# Patient Record
Sex: Female | Born: 1995 | Race: White | Hispanic: No | Marital: Married | Smoking: Former smoker
Health system: Southern US, Community
[De-identification: ages and names within clinical notes are randomized; demographics above are authoritative.]

## PROBLEM LIST (undated history)

## (undated) ENCOUNTER — Inpatient Hospital Stay (HOSPITAL_COMMUNITY): Payer: Self-pay

## (undated) DIAGNOSIS — J45909 Unspecified asthma, uncomplicated: Secondary | ICD-10-CM

## (undated) DIAGNOSIS — N83209 Unspecified ovarian cyst, unspecified side: Secondary | ICD-10-CM

## (undated) DIAGNOSIS — F419 Anxiety disorder, unspecified: Secondary | ICD-10-CM

## (undated) DIAGNOSIS — N390 Urinary tract infection, site not specified: Secondary | ICD-10-CM

## (undated) DIAGNOSIS — I10 Essential (primary) hypertension: Secondary | ICD-10-CM

## (undated) DIAGNOSIS — E119 Type 2 diabetes mellitus without complications: Secondary | ICD-10-CM

## (undated) DIAGNOSIS — E0590 Thyrotoxicosis, unspecified without thyrotoxic crisis or storm: Secondary | ICD-10-CM

## (undated) HISTORY — PX: ANKLE SURGERY: SHX546

## (undated) HISTORY — PX: TONSILLECTOMY: SUR1361

---
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
## 2017-03-28 DIAGNOSIS — F411 Generalized anxiety disorder: Secondary | ICD-10-CM | POA: Insufficient documentation

## 2017-03-28 DIAGNOSIS — F319 Bipolar disorder, unspecified: Secondary | ICD-10-CM | POA: Insufficient documentation

## 2017-05-11 ENCOUNTER — Encounter (HOSPITAL_COMMUNITY): Payer: Self-pay | Admitting: *Deleted

## 2017-05-11 ENCOUNTER — Other Ambulatory Visit: Payer: Self-pay

## 2017-05-11 ENCOUNTER — Emergency Department (HOSPITAL_COMMUNITY)
Admission: EM | Admit: 2017-05-11 | Discharge: 2017-05-12 | Disposition: A | Discharge status: Home / Self Care | Payer: BLUE CROSS/BLUE SHIELD | Attending: Emergency Medicine | Admitting: Emergency Medicine

## 2017-05-11 DIAGNOSIS — Z5321 Procedure and treatment not carried out due to patient leaving prior to being seen by health care provider: Secondary | ICD-10-CM | POA: Diagnosis not present

## 2017-05-11 DIAGNOSIS — M545 Low back pain: Secondary | ICD-10-CM | POA: Diagnosis present

## 2017-05-11 LAB — URINALYSIS, ROUTINE W REFLEX MICROSCOPIC
BACTERIA UA: NONE SEEN
BILIRUBIN URINE: NEGATIVE
Glucose, UA: NEGATIVE mg/dL
Ketones, ur: NEGATIVE mg/dL
Leukocytes, UA: NEGATIVE
NITRITE: NEGATIVE
PH: 7 (ref 5.0–8.0)
Protein, ur: NEGATIVE mg/dL
SPECIFIC GRAVITY, URINE: 1.003 — AB (ref 1.005–1.030)

## 2017-05-11 LAB — POC URINE PREG, ED: Preg Test, Ur: NEGATIVE

## 2017-05-11 NOTE — ED Triage Notes (Signed)
Pt with low back pain, increased urination and pain with urination, some nausea and vomiting

## 2017-05-11 NOTE — ED Notes (Signed)
Pt called for 

## 2017-05-12 NOTE — ED Notes (Signed)
Patient called x 3 and no answer. 

## 2017-06-17 IMAGING — CT CT ABD-PELV W/ CM
2 of 3 series · 17 of 46 positions shown, 19 images · IV contrast (Omnipaque 300)
Comparison: Ultrasound abdomen 10/03/2014.

CLINICAL DATA: RIGHT lower quadrant pain for 2 days. Nausea and
vomiting. Decreased appetite.

EXAM:
CT ABDOMEN AND PELVIS WITH CONTRAST
TECHNIQUE: Multidetector CT imaging of the abdomen and pelvis was performed
using the standard protocol following bolus administration of
intravenous contrast.
CONTRAST:  100mL OMNIPAQUE IOHEXOL 300 MG/ML  SOLN

[Series 2: abd_pel_with 5.0 b40f · axial · 0.74mm/px · z∈[-460,-70]mm · 14 of 90 slices shown, 16 images]
[im 6/90  soft-tissue]
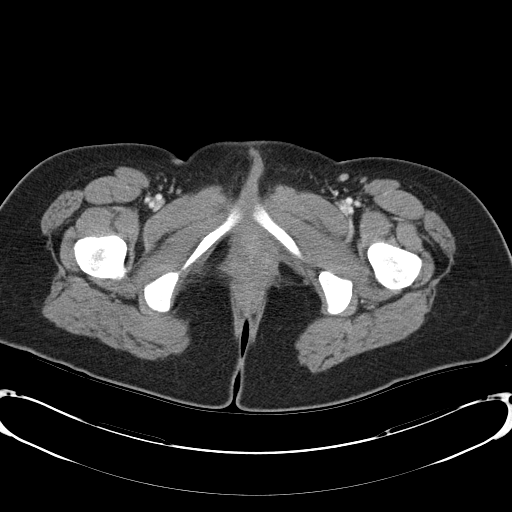
[im 6/90  bone]
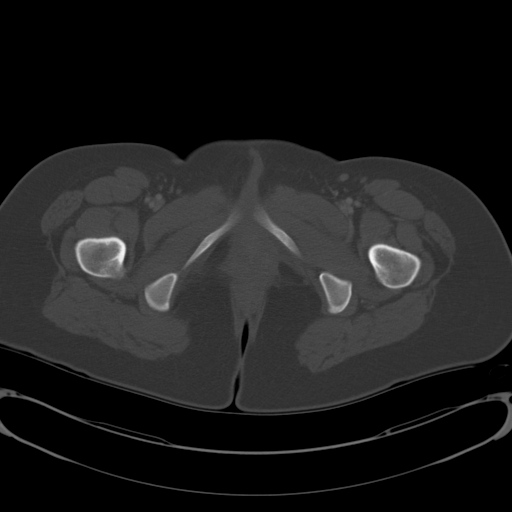
[im 12/90  soft-tissue]
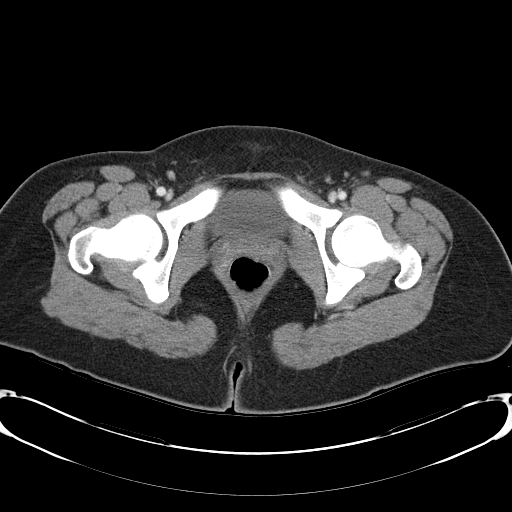
[im 18/90  soft-tissue]
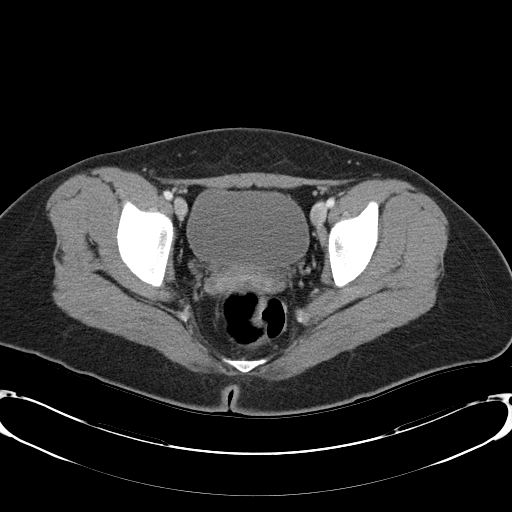
[im 23/90  soft-tissue]
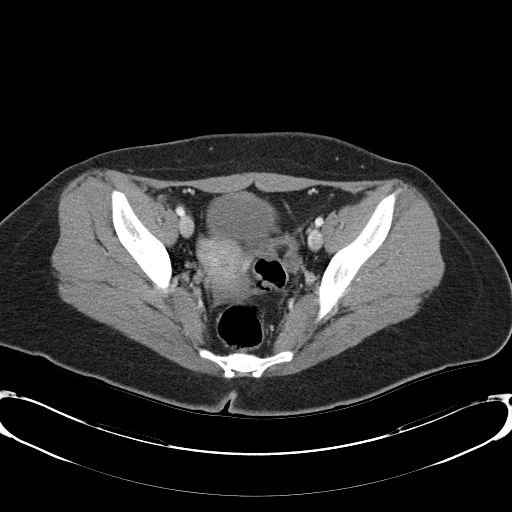
[im 29/90  soft-tissue]
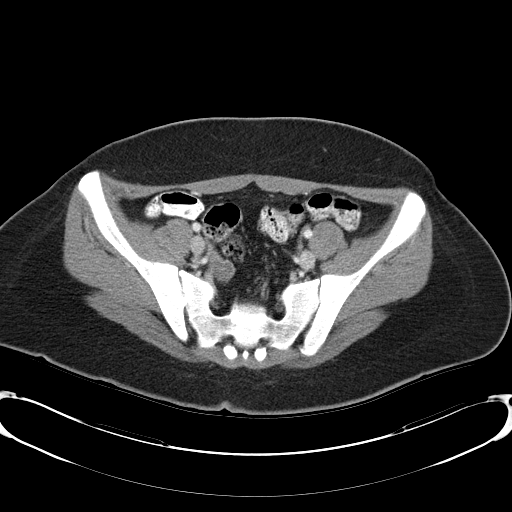
[im 35/90  soft-tissue]
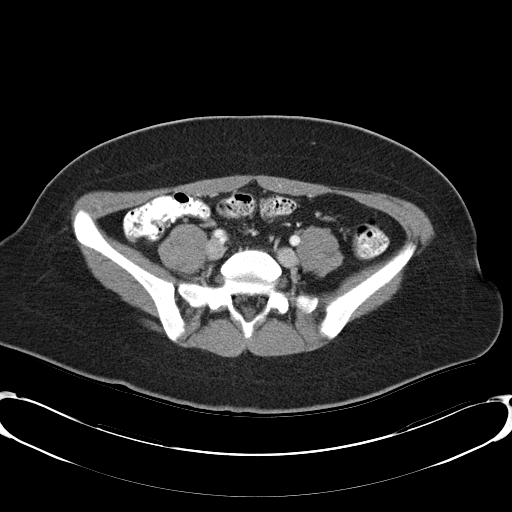
[im 41/90  soft-tissue]
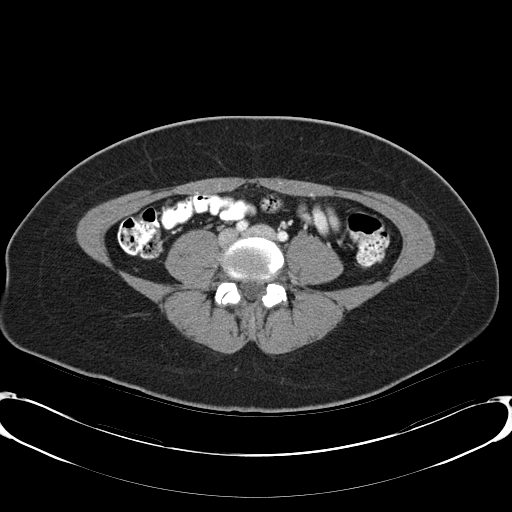
[im 49/90  soft-tissue]
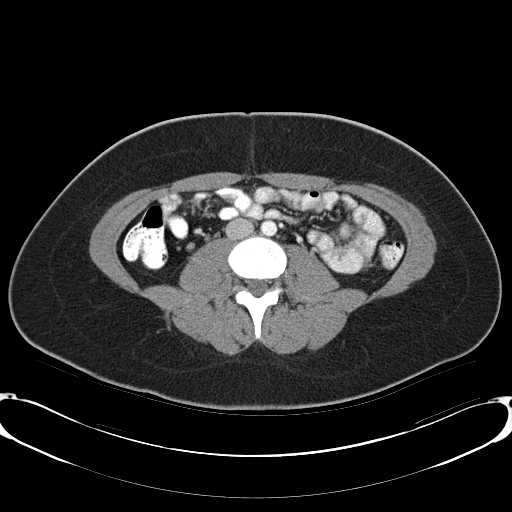
[im 55/90  soft-tissue]
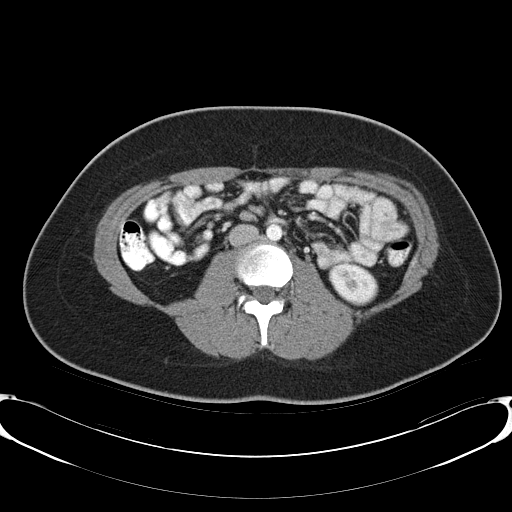
[im 55/90  bone]
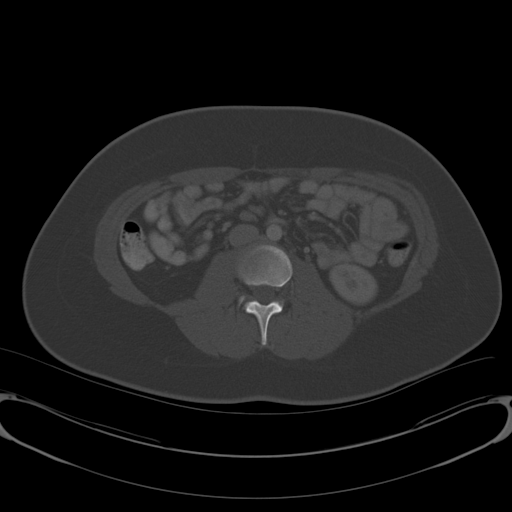
[im 61/90  soft-tissue]
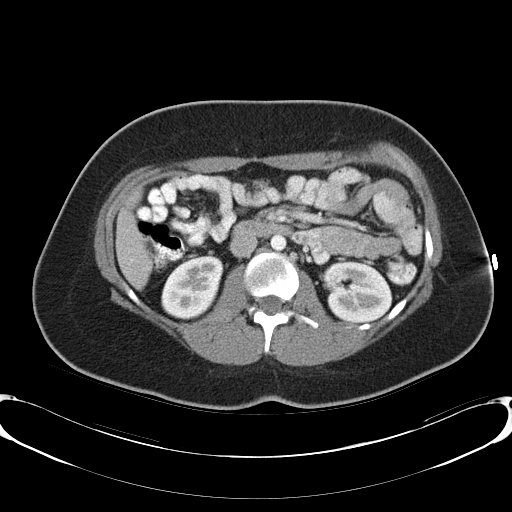
[im 67/90  soft-tissue]
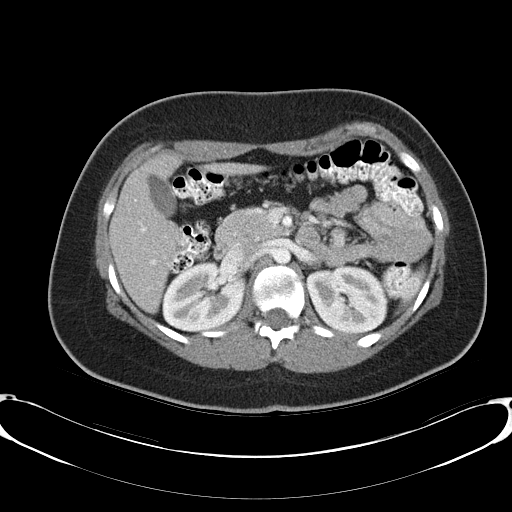
[im 72/90  soft-tissue]
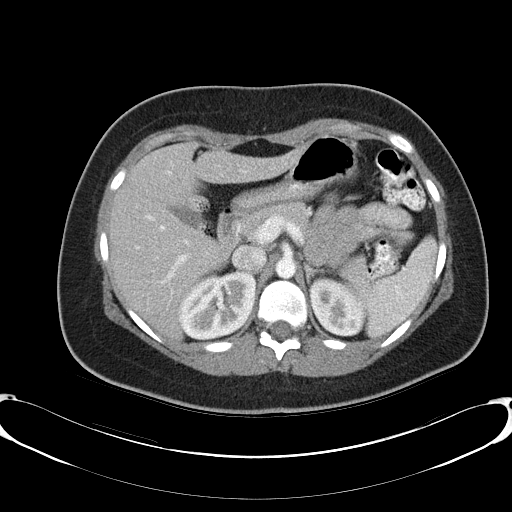
[im 78/90  soft-tissue]
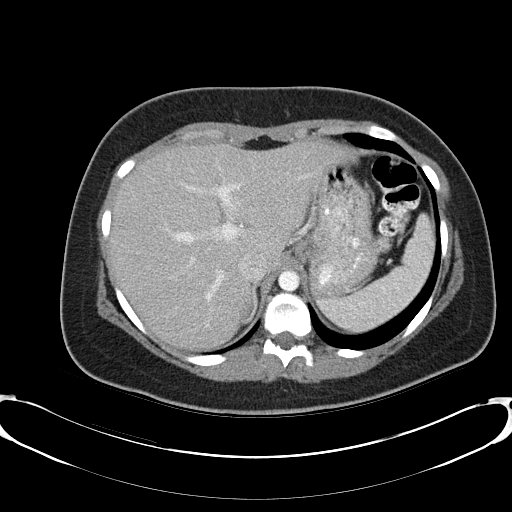
[im 84/90  soft-tissue]
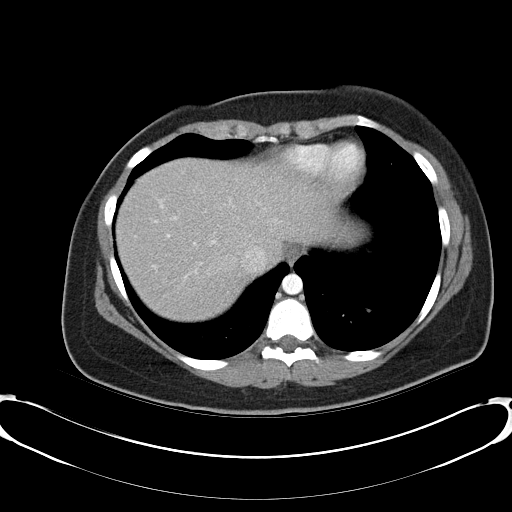

[Series 3: abd_pel_with 3.0 spo cor · coronal · 0.82mm/px · 3 of 77 slices shown]
[im 26/77  soft-tissue]
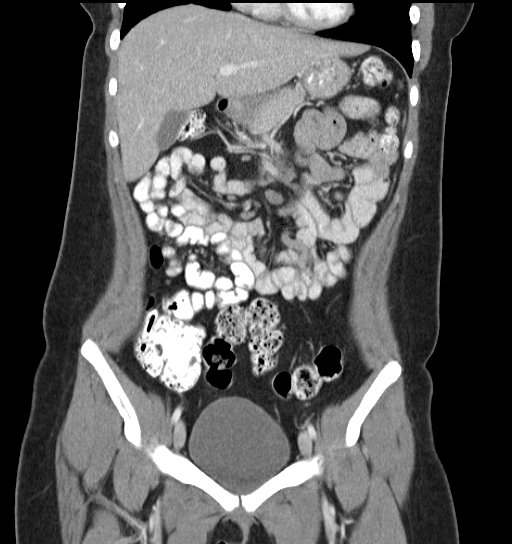
[im 34/77  soft-tissue]
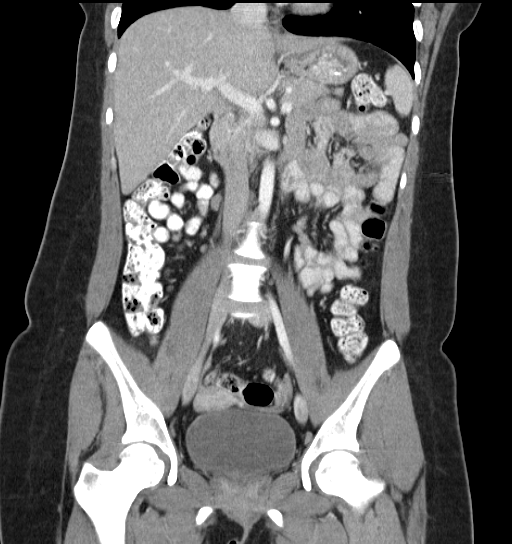
[im 43/77  soft-tissue]
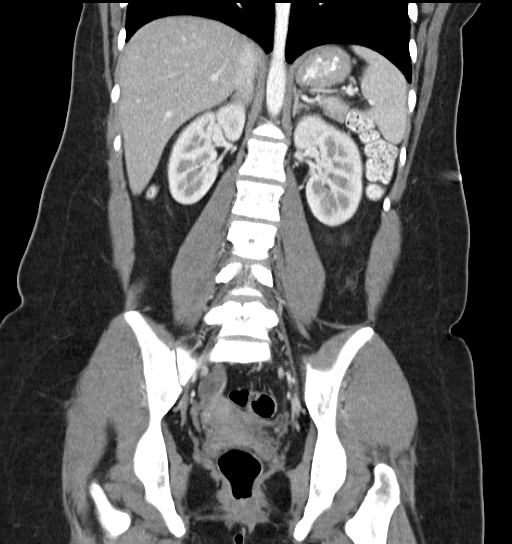

[17 of 46 positions shown; findings below may reference images not displayed]

FINDINGS: BODY WALL: Unremarkable.

LOWER CHEST: Unremarkable.

ABDOMEN/PELVIS:

Liver: No focal abnormality.

Biliary: No evidence of biliary obstruction or stone.

Pancreas: Unremarkable.

Spleen: Unremarkable.

Adrenals: Unremarkable.

Kidneys and ureters: No hydronephrosis or stone.

Bladder: Unremarkable.

Reproductive: Normal uterus. No adnexal mass. Trace fluid around the
RIGHT ovary of uncertain significance.

Bowel: No obstruction. Normal appendix.  Moderate stool burden.

Retroperitoneum: No mass or adenopathy.

Peritoneum: No free fluid or gas. Slightly prominent mesenteric
nodes extending toward the RIGHT lower quadrant suggesting
mesenteric adenitis.

Vascular: No acute abnormality.

OSSEOUS: No acute abnormalities.  Mild disc space narrowing L5-S1.
IMPRESSION: No evidence for appendicitis.

Slightly prominent mesenteric nodes extending toward the RIGHT lower
quadrant suggesting mesenteric adenitis.

Trace fluid around the RIGHT ovary of uncertain significance.

## 2019-07-26 ENCOUNTER — Other Ambulatory Visit: Payer: Self-pay

## 2019-09-12 ENCOUNTER — Emergency Department (HOSPITAL_COMMUNITY)
Admission: EM | Admit: 2019-09-12 | Discharge: 2019-09-12 | Disposition: A | Discharge status: Home / Self Care | Payer: 59 | Attending: Emergency Medicine | Admitting: Emergency Medicine

## 2019-09-12 ENCOUNTER — Emergency Department (HOSPITAL_COMMUNITY): Payer: 59

## 2019-09-12 ENCOUNTER — Other Ambulatory Visit: Payer: Self-pay

## 2019-09-12 ENCOUNTER — Ambulatory Visit: Payer: BLUE CROSS/BLUE SHIELD

## 2019-09-12 ENCOUNTER — Encounter (HOSPITAL_COMMUNITY): Payer: Self-pay | Admitting: Emergency Medicine

## 2019-09-12 DIAGNOSIS — F17210 Nicotine dependence, cigarettes, uncomplicated: Secondary | ICD-10-CM | POA: Diagnosis not present

## 2019-09-12 DIAGNOSIS — K625 Hemorrhage of anus and rectum: Secondary | ICD-10-CM | POA: Insufficient documentation

## 2019-09-12 DIAGNOSIS — R1031 Right lower quadrant pain: Secondary | ICD-10-CM | POA: Diagnosis not present

## 2019-09-12 DIAGNOSIS — J45909 Unspecified asthma, uncomplicated: Secondary | ICD-10-CM | POA: Insufficient documentation

## 2019-09-12 DIAGNOSIS — Z79899 Other long term (current) drug therapy: Secondary | ICD-10-CM | POA: Insufficient documentation

## 2019-09-12 LAB — COMPREHENSIVE METABOLIC PANEL
ALT: 41 U/L (ref 0–44)
AST: 27 U/L (ref 15–41)
Albumin: 4.4 g/dL (ref 3.5–5.0)
Alkaline Phosphatase: 51 U/L (ref 38–126)
Anion gap: 10 (ref 5–15)
BUN: 9 mg/dL (ref 6–20)
CO2: 22 mmol/L (ref 22–32)
Calcium: 9.4 mg/dL (ref 8.9–10.3)
Chloride: 107 mmol/L (ref 98–111)
Creatinine, Ser: 0.77 mg/dL (ref 0.44–1.00)
GFR calc Af Amer: >60 mL/min (ref >60)
GFR calc non Af Amer: >60 mL/min (ref >60)
Glucose, Bld: 93 mg/dL (ref 70–99)
Potassium: 4.1 mmol/L (ref 3.5–5.1)
Sodium: 139 mmol/L (ref 135–145)
Total Bilirubin: 0.8 mg/dL (ref 0.3–1.2)
Total Protein: 7.5 g/dL (ref 6.5–8.1)

## 2019-09-12 LAB — CBC
HCT: 42 % (ref 36.0–46.0)
Hemoglobin: 14.4 g/dL (ref 12.0–15.0)
MCH: 31.7 pg (ref 26.0–34.0)
MCHC: 34.3 g/dL (ref 30.0–36.0)
MCV: 92.5 fL (ref 80.0–100.0)
Platelets: 327 10*3/uL (ref 150–400)
RBC: 4.54 MIL/uL (ref 3.87–5.11)
RDW: 11.5 % (ref 11.5–15.5)
WBC: 7.5 10*3/uL (ref 4.0–10.5)
nRBC: 0 % (ref 0.0–0.2)

## 2019-09-12 LAB — POC OCCULT BLOOD, ED: Fecal Occult Bld: POSITIVE — AB

## 2019-09-12 LAB — ABO/RH: ABO/RH(D): A POS

## 2019-09-12 LAB — TYPE AND SCREEN
ABO/RH(D): A POS
Antibody Screen: NEGATIVE

## 2019-09-12 LAB — I-STAT BETA HCG BLOOD, ED (MC, WL, AP ONLY): I-stat hCG, quantitative: <5 m[IU]/mL (ref <5)

## 2019-09-12 MED ORDER — IOHEXOL 300 MG/ML  SOLN
100.0000 mL | Freq: Once | INTRAMUSCULAR | Status: AC | PRN
  Administered 2019-09-12: 100 mL via INTRAVENOUS

## 2019-09-12 NOTE — ED Provider Notes (Signed)
[facility] EMERGENCY DEPARTMENT Provider Note   CSN: [id number] Arrival date & time: 09/12/19  1258     History Chief Complaint  Patient presents with   Rectal Bleeding   Abdominal Pain    Carol Paul is a 24 y.o. female.  HPI She presents for evaluation of ongoing rectal bleeding, worse today with a single episode of a large amount of bleeding, both dark and red color.  He has intermittent every 3 to 4-day bleeding from the rectum over the last 2 months.  She has a remote history of "intestinal trouble."  She had an endoscopy at age 88, saw GI doctor then, without clear diagnosis.  She has had periods of vomiting, anorexia, and intermittent pain.  She denies diarrhea.  She does not currently see GI, and does not take any medications for her intestinal disorders.  She denies fever, chills, cough, shortness of breath or chest pain.  She thinks she has lost 15 pounds in the last month.  She noticed some right lower back and right lower abdomen pain, since last night.  The pain is described as sharp.  There are no other known modifying factors.    Past Medical History:  Diagnosis Date   Asthma     Patient Active Problem List   Diagnosis Date Noted   MDD (major depressive disorder) 11/26/2016   Nausea vomiting and diarrhea 11/07/2016   Abdominal pain 11/07/2016   Lactic acid increased 11/07/2016   Leukocytosis 11/07/2016   SIRS (systemic inflammatory response syndrome) (HCC) 11/07/2016    Past Surgical History:  Procedure Laterality Date   TONSILLECTOMY       OB History   No obstetric history on file.     Family History  Problem Relation Age of Onset   CAD Other     Social History   Tobacco Use   Smoking status: Current Some Day Smoker   Smokeless tobacco: Never Used  Substance Use Topics   Alcohol use: No    Comment: denies   Drug use: Yes    Types: Marijuana    Home Medications Prior to Admission medications     Medication Sig Start Date End Date Taking? Authorizing Provider  albuterol (PROVENTIL HFA;VENTOLIN HFA) 108 (90 BASE) MCG/ACT inhaler Inhale 1 puff into the lungs every 6 (six) hours as needed for wheezing or shortness of breath.   Yes [provider]  ALPRAZolam Prudy Feeler) 1 MG tablet Take 1 mg by mouth 2 (two) times daily as needed for anxiety.   Yes [provider]  amphetamine-dextroamphetamine (ADDERALL) 10 MG tablet Take 10 mg by mouth 3 (three) times daily.   Yes [provider]  fluticasone furoate-vilanterol (BREO ELLIPTA) 200-25 MCG/INH AEPB Inhale 1 puff into the lungs daily. 04/21/19  Yes [provider]  traZODone (DESYREL) 50 MG tablet Take 1 tablet (50 mg total) by mouth at bedtime as needed for sleep. 11/27/16  Yes Laveda Abbe, NP  famotidine (PEPCID) 20 MG tablet Take 1 tablet (20 mg total) by mouth 2 (two) times daily. Patient not taking: Reported on 09/12/2019 11/08/16   Randel Pigg, Dorma Russell, MD  gabapentin (NEURONTIN) 100 MG capsule Take 1 capsule (100 mg total) by mouth 3 (three) times daily. Patient not taking: Reported on 09/12/2019 11/27/16   Laveda Abbe, NP  hydrOXYzine (ATARAX/VISTARIL) 25 MG tablet Take 1 tablet (25 mg total) by mouth every 6 (six) hours as needed for anxiety. Patient not taking: Reported on 09/12/2019 11/27/16  Laveda Abbe, NP  sertraline (ZOLOFT) 25 MG tablet Take 1 tablet (25 mg total) by mouth daily. Patient not taking: Reported on 09/12/2019 11/27/16   Laveda Abbe, NP    Allergies    Patient has no known allergies.  Review of Systems   Review of Systems  All other systems reviewed and are negative.   Physical Exam Updated Vital Signs BP 99/71    Pulse (!) 108    Temp 97.9 F (36.6 C) (Oral)    Resp (!) 21    LMP 08/27/2019 (Approximate)    SpO2 100%   Physical Exam Vitals and nursing note reviewed.  Constitutional:      General: She is not in acute distress.    Appearance:  She is well-developed. She is not ill-appearing, toxic-appearing or diaphoretic.  HENT:     Head: Normocephalic and atraumatic.     Right Ear: External ear normal.     Left Ear: External ear normal.  Eyes:     Conjunctiva/sclera: Conjunctivae normal.     Pupils: Pupils are equal, round, and reactive to light.  Neck:     Trachea: Phonation normal.  Cardiovascular:     Rate and Rhythm: Normal rate and regular rhythm.     Heart sounds: Normal heart sounds.  Pulmonary:     Effort: Pulmonary effort is normal.     Breath sounds: Normal breath sounds.  Abdominal:     General: There is no distension.     Palpations: Abdomen is soft. There is no mass.     Tenderness: There is abdominal tenderness (Right lower quadrant, mild). There is no guarding.     Hernia: No hernia is present.  Genitourinary:    Comments: Normal anus.  Small amount of brown stool in rectal vault.  No visualized anal fissure, external hemorrhoid; or palpable internal hemorrhoid.  No rectal mass.  No fecal impaction. Musculoskeletal:        General: Normal range of motion.     Cervical back: Normal range of motion and neck supple.  Skin:    General: Skin is warm and dry.  Neurological:     Mental Status: She is alert and oriented to person, place, and time.     Cranial Nerves: No cranial nerve deficit.     Sensory: No sensory deficit.     Motor: No abnormal muscle tone.     Coordination: Coordination normal.  Psychiatric:        Behavior: Behavior normal.        Thought Content: Thought content normal.        Judgment: Judgment normal.     ED Results / Procedures / Treatments   Labs (all labs ordered are listed, but only abnormal results are displayed) Labs Reviewed  POC OCCULT BLOOD, ED - Abnormal; Notable for the following components:      Result Value   Fecal Occult Bld POSITIVE (*)    All other components within normal limits  COMPREHENSIVE METABOLIC PANEL  CBC  I-STAT BETA HCG BLOOD, ED ([facility], [facility], [facility]  ONLY)  TYPE AND SCREEN  ABO/RH    EKG None  Radiology CT Abdomen Pelvis W Contrast  Result Date: 09/12/2019 CLINICAL DATA:  24 year old presenting with a two-month history of bloody stools, within increase in the amount of blood in the stool today. EXAM: CT ABDOMEN AND PELVIS WITH CONTRAST TECHNIQUE: Multidetector CT imaging of the abdomen and pelvis was performed using the standard protocol following bolus administration of intravenous  contrast. CONTRAST:  123mL OMNIPAQUE IOHEXOL 300 MG/ML IV. COMPARISON:  11/07/2016 and earlier. FINDINGS: Lower chest: Scattered areas of hyperlucency involving the visualized lung bases. No confluent airspace consolidation. No pleural effusions. Normal heart size. Hepatobiliary: Liver normal in size and appearance. Gallbladder normal in appearance without calcified gallstones. No biliary ductal dilation. Pancreas: Normal in appearance without evidence of mass, ductal dilation, or inflammation. Spleen: Normal in size and appearance. Adrenals/Urinary Tract: Normal appearing adrenal glands. Kidneys normal in size and appearance without focal parenchymal abnormality. No hydronephrosis. No evidence of urinary tract calculi. Normal appearing urinary bladder. Stomach/Bowel: Stomach normal in appearance for the degree of distention. Normal-appearing small bowel. Moderate stool burden in the ascending and transverse colon. Descending colon, sigmoid colon and rectum decompressed. No colonic wall thickening to suggest colitis. No focal abnormality involving the colon. Normal appendix in the RIGHT upper pelvis extending to near the midline. Vascular/Lymphatic: No visible aortoiliofemoral atherosclerosis. Widely patent visceral arteries. Normal-appearing portal venous and systemic venous systems. Scattered normal sized lymph nodes throughout the mesentery. No pathologic lymphadenopathy. Reproductive: Normal-appearing uterus and ovaries without evidence of adnexal mass. Other: None.  Musculoskeletal: Regional skeleton unremarkable without acute or significant osseous abnormality. IMPRESSION: 1. No acute abnormalities involving the abdomen or pelvis. Specifically, no findings to explain the patient's bloody stools. 2. Scattered areas of hyperlucency involving the visualized lung bases which can be seen in patients with asthma and/or bronchitis. Electronically Signed   By: Evangeline Dakin M.D.   On: 09/12/2019 16:50    Procedures Procedures (including critical care time)  Medications Ordered in ED Medications  iohexol (OMNIPAQUE) 300 MG/ML solution 100 mL (100 mLs Intravenous Contrast Given 09/12/19 1632)    ED Course  I have reviewed the triage vital signs and the nursing notes.  Pertinent labs & imaging results that were available during my care of the patient were reviewed by me and considered in my medical decision making (see chart for details).  Clinical Course as of Sep 12 1802  Sun Sep 12, 2019  1726 Normal  I-Stat beta hCG blood, ED [EW]  1726 Normal  Comprehensive metabolic panel [EW]  4098 Normal  CBC [EW]  1745 Positive, normal  POC occult blood, ED(!) [EW]    Clinical Course User Index [EW] Daleen Bo, MD   MDM Rules/Calculators/A&P                       Patient Vitals for the past 24 hrs:  BP Temp Temp src Pulse Resp SpO2  09/12/19 1730 99/71 -- -- -- (!) 21 --  09/12/19 1711 110/65 -- -- -- 17 --  09/12/19 1644 -- -- -- (!) 108 17 100 %  09/12/19 1600 108/75 -- -- (!) 102 13 100 %  09/12/19 1530 99/82 -- -- (!) 108 16 --  09/12/19 1515 -- -- -- (!) 108 18 100 %  09/12/19 1500 105/65 -- -- (!) 111 (!) 23 96 %  09/12/19 1430 95/73 -- -- (!) 102 (!) 21 97 %  09/12/19 1345 112/83 -- -- (!) 116 -- 100 %  09/12/19 1342 126/89 -- -- (!) 120 -- 99 %  09/12/19 1307 125/89 97.9 F (36.6 C) Oral 100 16 100 %    5:45 PM Reevaluation with update and discussion. After initial assessment and treatment, an updated evaluation reveals she is  comfortable has no further complaints.  Findings discussed with the patient and all questions were answered. Daleen Bo   Medical Decision Making: Abdominal  pain, with rectal bleeding, by history.  Screening evaluation is reassuring.  No evidence for colitis, bowel obstruction, intra-abdominal mass, serious bacterial infection, anemia, metabolic instability or impending vascular collapse.  Stool guaiac testing, positive, stool is brown in color.  Patient has a prolonged history of intestinal problem, which may be irritable bowel disease.  She will be referred to GI for further evaluation and treatment as needed.  CRITICAL CARE- no Performed by: Mancel Bale   Nursing Notes Reviewed/ Care Coordinated Applicable Imaging Reviewed Interpretation of Laboratory Data incorporated into ED treatment  The patient appears reasonably screened and/or stabilized for discharge and I doubt any other medical condition or other [facility] requiring further screening, evaluation, or treatment in the ED at this time prior to discharge.  Plan: Home Medications-symptomatic treatment of choice; Home Treatments-rest, fluids; return here if the recommended treatment, does not improve the symptoms; Recommended follow up-GI follow-up for further evaluation and treatment, within 1 month.    Final Clinical Impression(s) / ED Diagnoses Final diagnoses:  Right lower quadrant abdominal pain    Rx / DC Orders ED Discharge Orders    None       Mancel Bale, MD 09/12/19 [phone]

## 2019-09-12 NOTE — ED Notes (Signed)
Pt transported to CT ?

## 2019-09-12 NOTE — Discharge Instructions (Signed)
There was a very small amount of rectal bleeding found on the testing today.  There are no other abnormalities on the CAT scan or in your blood work.  Try to eat a normal diet and drink plenty of fluids.  Call the GI doctors for a follow-up appointment to be seen about the pain and bleeding.  Return here, if needed.

## 2019-09-12 NOTE — ED Triage Notes (Signed)
Pt reports LLQ pain and BRB and darker blood in her stools, she reports she has noticed some blood off and on in her stools over the past few months but never as much as today. Reports hx of ongoing diarrhea and constipation.

## 2019-09-21 ENCOUNTER — Ambulatory Visit: Payer: 59 | Attending: Family Medicine | Admitting: Family Medicine

## 2019-09-21 ENCOUNTER — Encounter: Payer: Self-pay | Admitting: Family Medicine

## 2019-09-21 ENCOUNTER — Other Ambulatory Visit: Payer: Self-pay

## 2019-09-21 VITALS — BP 128/79 | HR 99 | Ht 61.0 in | Wt 165.0 lb

## 2019-09-21 DIAGNOSIS — K5909 Other constipation: Secondary | ICD-10-CM | POA: Diagnosis not present

## 2019-09-21 DIAGNOSIS — K921 Melena: Secondary | ICD-10-CM

## 2019-09-21 MED ORDER — HYDROCORT-PRAMOXINE (PERIANAL) 2.5-1 % EX CREA: 1.0000 "application " | TOPICAL_CREAM | Freq: Three times a day (TID) | CUTANEOUS | 0 refills | Status: DC

## 2019-09-21 MED ORDER — POLYETHYLENE GLYCOL 3350 17 G PO PACK: 17.0000 g | PACK | Freq: Every day | ORAL | 0 refills | Status: DC

## 2019-09-21 NOTE — Progress Notes (Signed)
Subjective:  Patient ID: Carol Paul, female    DOB: 08/22/1995  Age: 24 y.o. MRN: [id number]  CC: New Patient (Initial Visit)   HPI Ismael J Pettus presents to establish care.   She has had rectal bleeding off after her bowel movement initially but this later progressed to clots mixed with bowel movements and blood is described as a lot She is constipated and this alternates with Diarrhea; 3-4 days of constipation followed by diarrhea She has some abdominal cramps after moving her bowel. Lost 20 lbs in the last 1.5 months. Dad had a pre cancerous polyp removed recently but she denies any known family history of inflammatory bowel disease or colon cancer. Sometimes has mucus in her stool; she also has abdominal bloating. Seen at the ED last week for same and Hemoccult was positive.  CT abdomen and pelvis with contrast revealed: IMPRESSION: 1. No acute abnormalities involving the abdomen or pelvis. Specifically, no findings to explain the patient's bloody stools. 2. Scattered areas of hyperlucency involving the visualized lung bases which can be seen in patients with asthma and/or bronchitis  Past Medical History:  Diagnosis Date  . Asthma     Past Surgical History:  Procedure Laterality Date  . TONSILLECTOMY      Family History  Problem Relation Age of Onset  . CAD Other   . Diabetes Mother     No Known Allergies  Outpatient Medications Prior to Visit  Medication Sig Dispense Refill  . albuterol (PROVENTIL HFA;VENTOLIN HFA) 108 (90 BASE) MCG/ACT inhaler Inhale 1 puff into the lungs every 6 (six) hours as needed for wheezing or shortness of breath.    . ALPRAZolam (XANAX) 1 MG tablet Take 1 mg by mouth 2 (two) times daily as needed for anxiety.    Marland Kitchen amphetamine-dextroamphetamine (ADDERALL) 10 MG tablet Take 10 mg by mouth 3 (three) times daily.    . fluticasone furoate-vilanterol (BREO ELLIPTA) 200-25 MCG/INH AEPB Inhale 1 puff into the lungs daily.    .  traZODone (DESYREL) 50 MG tablet Take 1 tablet (50 mg total) by mouth at bedtime as needed for sleep. 14 tablet 0  . famotidine (PEPCID) 20 MG tablet Take 1 tablet (20 mg total) by mouth 2 (two) times daily. (Patient not taking: Reported on 09/12/2019) 20 tablet 0  . gabapentin (NEURONTIN) 100 MG capsule Take 1 capsule (100 mg total) by mouth 3 (three) times daily. (Patient not taking: Reported on 09/12/2019) 42 capsule 0  . hydrOXYzine (ATARAX/VISTARIL) 25 MG tablet Take 1 tablet (25 mg total) by mouth every 6 (six) hours as needed for anxiety. (Patient not taking: Reported on 09/12/2019) 56 tablet 0  . sertraline (ZOLOFT) 25 MG tablet Take 1 tablet (25 mg total) by mouth daily. (Patient not taking: Reported on 09/12/2019) 14 tablet 0   No facility-administered medications prior to visit.     ROS Review of Systems  Constitutional: Negative for activity change, appetite change and fatigue.  HENT: Negative for congestion, sinus pressure and sore throat.   Eyes: Negative for visual disturbance.  Respiratory: Negative for cough, chest tightness, shortness of breath and wheezing.   Cardiovascular: Negative for chest pain and palpitations.  Gastrointestinal: Negative for abdominal distention, abdominal pain and constipation.  Endocrine: Negative for polydipsia.  Genitourinary: Negative for dysuria and frequency.  Musculoskeletal: Negative for arthralgias and back pain.  Skin: Negative for rash.  Neurological: Negative for tremors, light-headedness and numbness.  Hematological: Does not bruise/bleed easily.  Psychiatric/Behavioral: Negative for agitation and  behavioral problems.    Objective:  BP 128/79   Pulse 99   Ht 5\' 1"  (1.549 m)   Wt 165 lb (74.8 kg)   LMP 08/27/2019 (Approximate)   SpO2 99%   BMI 31.18 kg/m   BP/Weight 09/21/2019 09/12/2019 05/11/2017  Systolic BP 128 108 121  Diastolic BP 79 73 90  Wt. (Lbs) 165 - 185  BMI 31.18 - 34.96  Some encounter information is confidential and  restricted. Go to Review Flowsheets activity to see all data.      Physical Exam Constitutional:      Appearance: She is well-developed.  Neck:     Vascular: No JVD.  Cardiovascular:     Rate and Rhythm: Normal rate.     Heart sounds: Normal heart sounds. No murmur.  Pulmonary:     Effort: Pulmonary effort is normal.     Breath sounds: Normal breath sounds. No wheezing or rales.  Chest:     Chest wall: No tenderness.  Abdominal:     General: Bowel sounds are normal. There is no distension.     Palpations: Abdomen is soft. There is no mass.     Tenderness: There is no abdominal tenderness.  Musculoskeletal:        General: Normal range of motion.     Right lower leg: No edema.     Left lower leg: No edema.  Neurological:     Mental Status: She is alert and oriented to person, place, and time.  Psychiatric:        Mood and Affect: Mood normal.     CMP Latest Ref Rng & Units 09/12/2019 11/27/2016 11/08/2016  Glucose 70 - 99 mg/dL 93 01/08/2017) 77  BUN 6 - 20 mg/dL 9 8 585(I)  Creatinine <7(P - 1.00 mg/dL 8.24 2.35 3.61  Sodium 135 - 145 mmol/L 139 138 141  Potassium 3.5 - 5.1 mmol/L 4.1 3.4(L) 3.4(L)  Chloride 98 - 111 mmol/L 107 106 110  CO2 22 - 32 mmol/L 22 26 25   Calcium 8.9 - 10.3 mg/dL 9.4 4.43) 7.9(L)  Total Protein 6.5 - 8.1 g/dL 7.5 6.7 5.3(L)  Total Bilirubin 0.3 - 1.2 mg/dL 0.8 0.4 0.4  Alkaline Phos 38 - 126 U/L 51 57 34(L)  AST 15 - 41 U/L 27 26 24   ALT 0 - 44 U/L 41 30 22    Lipid Panel     Component Value Date/Time   CHOL 128 11/27/2016 0630   TRIG 116 11/27/2016 0630   HDL 32 (L) 11/27/2016 0630   CHOLHDL 4.0 11/27/2016 0630   VLDL 23 11/27/2016 0630   LDLCALC 73 11/27/2016 0630    CBC    Component Value Date/Time   WBC 7.5 09/12/2019 1326   RBC 4.54 09/12/2019 1326   HGB 14.4 09/12/2019 1326   HCT 42.0 09/12/2019 1326   PLT 327 09/12/2019 1326   MCV 92.5 09/12/2019 1326   MCH 31.7 09/12/2019 1326   MCHC 34.3 09/12/2019 1326   RDW 11.5  09/12/2019 1326   LYMPHSABS 1.9 03/14/2015 1145   MONOABS 0.5 03/14/2015 1145   EOSABS 0.1 03/14/2015 1145   BASOSABS 0.0 03/14/2015 1145    Lab Results  Component Value Date   HGBA1C 5.4 11/27/2016    Assessment & Plan:   1. Hematochezia She has a significant amount of hematochezia which is suspicious for inflammatory bowel disease - H. pylori breath test - Pan-ANCA - Celiac Panel - Ambulatory referral to Gastroenterology - CALPROTECTIN  2. Other  constipation Advised to increase fiber intake, increase water intake IBS also a possibility - polyethylene glycol (MIRALAX MIX-IN PAX) 17 g packet; Take 17 g by mouth daily.  Dispense: 14 each; Refill: 0 - hydrocortisone-pramoxine (ANALPRAM HC) 2.5-1 % rectal cream; Place 1 application rectally 3 (three) times daily.  Dispense: 30 g; Refill: 0  Return in about 6 weeks (around 11/02/2019) for Coordination of care.Charlott Rakes, MD, FAAFP. [facility] and Delanson Coleman, [place]   09/21/2019, 3:25 PM

## 2019-09-21 NOTE — Progress Notes (Signed)
Went to the Ed for vaginal bleeding.

## 2019-09-21 NOTE — Patient Instructions (Signed)
Rectal Bleeding  Rectal bleeding is when blood comes out of the opening of the butt (anus). People with this kind of bleeding may notice bright red blood in their underwear or in the toilet after they poop (have a bowel movement). They may also have dark red or black poop (stool). Rectal bleeding is often a sign that something is wrong. It needs to be checked by a doctor. Follow these instructions at home: Watch for any changes in your condition. Take these actions to help with bleeding and discomfort:  Eat a diet that is high in fiber. This will keep your poop soft so it is easier for you to poop without pushing too hard. Ask your doctor to tell you what foods and drinks are high in fiber.  Drink enough fluid to keep your pee (urine) clear or pale yellow. This also helps keep your poop soft.  Try taking a warm bath. This may help with pain.  Keep all follow-up visits as told by your doctor. This is important. Get help right away if:  You have new bleeding.  You have more bleeding than before.  You have black or dark red poop.  You throw up (vomit) blood or something that looks like coffee grounds.  You have pain or tenderness in your belly (abdomen).  You have a fever.  You feel weak.  You feel sick to your stomach (nauseous).  You pass out (faint).  You have very bad pain in your butt.  You cannot poop. This information is not intended to replace advice given to you by your health care provider. Make sure you discuss any questions you have with your health care provider. Document Revised: 06/06/2017 Document Reviewed: 08/20/2015 Elsevier Patient Education  2020 Elsevier Inc.  

## 2019-09-22 ENCOUNTER — Other Ambulatory Visit: Payer: 59

## 2019-11-02 ENCOUNTER — Encounter: Payer: Self-pay | Admitting: Family Medicine

## 2020-01-18 ENCOUNTER — Other Ambulatory Visit: Payer: Self-pay

## 2020-01-18 ENCOUNTER — Emergency Department (HOSPITAL_COMMUNITY)
Admission: EM | Admit: 2020-01-18 | Discharge: 2020-01-19 | Disposition: A | Discharge status: Home / Self Care | Payer: 59 | Attending: Emergency Medicine | Admitting: Emergency Medicine

## 2020-01-18 ENCOUNTER — Encounter (HOSPITAL_COMMUNITY): Payer: Self-pay

## 2020-01-18 ENCOUNTER — Emergency Department (HOSPITAL_COMMUNITY): Payer: 59

## 2020-01-18 DIAGNOSIS — R490 Dysphonia: Secondary | ICD-10-CM | POA: Diagnosis not present

## 2020-01-18 DIAGNOSIS — J45909 Unspecified asthma, uncomplicated: Secondary | ICD-10-CM | POA: Insufficient documentation

## 2020-01-18 DIAGNOSIS — Z79899 Other long term (current) drug therapy: Secondary | ICD-10-CM | POA: Insufficient documentation

## 2020-01-18 DIAGNOSIS — Z87891 Personal history of nicotine dependence: Secondary | ICD-10-CM | POA: Insufficient documentation

## 2020-01-18 LAB — BASIC METABOLIC PANEL
Anion gap: 11 (ref 5–15)
BUN: 6 mg/dL (ref 6–20)
CO2: 20 mmol/L — ABNORMAL LOW (ref 22–32)
Calcium: 9.3 mg/dL (ref 8.9–10.3)
Chloride: 106 mmol/L (ref 98–111)
Creatinine, Ser: 0.65 mg/dL (ref 0.44–1.00)
GFR calc Af Amer: >60 mL/min (ref >60)
GFR calc non Af Amer: >60 mL/min (ref >60)
Glucose, Bld: 148 mg/dL — ABNORMAL HIGH (ref 70–99)
Potassium: 4.3 mmol/L (ref 3.5–5.1)
Sodium: 137 mmol/L (ref 135–145)

## 2020-01-18 LAB — CBC
HCT: 41 % (ref 36.0–46.0)
Hemoglobin: 13.4 g/dL (ref 12.0–15.0)
MCH: 31.1 pg (ref 26.0–34.0)
MCHC: 32.7 g/dL (ref 30.0–36.0)
MCV: 95.1 fL (ref 80.0–100.0)
Platelets: 304 10*3/uL (ref 150–400)
RBC: 4.31 MIL/uL (ref 3.87–5.11)
RDW: 11.8 % (ref 11.5–15.5)
WBC: 8.6 10*3/uL (ref 4.0–10.5)
nRBC: 0 % (ref 0.0–0.2)

## 2020-01-18 LAB — I-STAT BETA HCG BLOOD, ED (MC, WL, AP ONLY): I-stat hCG, quantitative: <5 m[IU]/mL (ref <5)

## 2020-01-18 LAB — TROPONIN I (HIGH SENSITIVITY)
Troponin I (High Sensitivity): <2 ng/L (ref <18)
Troponin I (High Sensitivity): <2 ng/L (ref <18)

## 2020-01-18 MED ORDER — SODIUM CHLORIDE 0.9% FLUSH: 3.0000 mL | Freq: Once | INTRAVENOUS | Status: DC

## 2020-01-18 NOTE — ED Triage Notes (Signed)
Pt reports neck pain that radiates into throat x 2.5 months. Pt reports pain suddenly became worse last night causing hoarseness in her voice, feeling as if she couldn't breathe, and left side chest pain. PT reports going to [facility] ED where she tested negative for strep and covid. She was discharged home and told to follow up with PCP -PCP sent her here. Pt a&ox4, NAD, mildly anxious at triage. HR in 140's. Airway in tact with nonlabored breathing.

## 2020-01-19 MED ORDER — ALBUTEROL SULFATE HFA 108 (90 BASE) MCG/ACT IN AERS
2.0000 | INHALATION_SPRAY | Freq: Once | RESPIRATORY_TRACT | Status: AC
  Administered 2020-01-19: 2 via RESPIRATORY_TRACT
  Filled 2020-01-19: qty 6.7

## 2020-01-19 MED ORDER — LORAZEPAM 1 MG PO TABS
1.0000 mg | ORAL_TABLET | Freq: Once | ORAL | Status: AC
  Administered 2020-01-19: 1 mg via ORAL
  Filled 2020-01-19: qty 1

## 2020-01-19 NOTE — ED Provider Notes (Signed)
[facility]   CSN: [id number] Arrival date & time: 01/18/20  1547     History CC:  Throat tightness  Carol Paul is a 24 y.o. female with a history of anxiety, asthma, presented to emergency department with hoarseness and tightness in her throat.  She reports onset of her symptoms about 2-1/2 months ago.  She describes a feeling of tightness in her throat, worse with inspiration.  She describes a subjective sensation of shortness of breath, which abruptly worsened in the past 2 days.  Says she has felt lightheaded and feels like she cannot get a full breath in.  She has never had this kind of issue before.  She went to [facility] emergency department 2 days ago where she tested negative for strep and Covid.  She was discharged with a follow-up with her PCP, but her PCP could not accommodate her in the office, and so referred her back to the emergency department today.  She describes feeling very anxious.  She denies any fevers or chills.  She did give herself a few albuterol puffs at home and overnight while in her waiting room.  HPI     Past Medical History:  Diagnosis Date  . Asthma     Patient Active Problem List   Diagnosis Date Noted  . MDD (major depressive disorder) 11/26/2016  . Nausea vomiting and diarrhea 11/07/2016  . Abdominal pain 11/07/2016  . Lactic acid increased 11/07/2016  . Leukocytosis 11/07/2016  . SIRS (systemic inflammatory response syndrome) (HCC) 11/07/2016    Past Surgical History:  Procedure Laterality Date  . TONSILLECTOMY       OB History   No obstetric history on file.     Family History  Problem Relation Age of Onset  . CAD Other   . Diabetes Mother     Social History   Tobacco Use  . Smoking status: Former Games developermoker  . Smokeless tobacco: Never Used  Vaping Use  . Vaping Use: Never used  Substance Use Topics  . Alcohol use: No    Comment: denies  . Drug use: Yes    Types:  Marijuana    Home Medications Prior to Admission medications   Medication Sig Start Date End Date Taking? Authorizing Provider  albuterol (PROVENTIL HFA;VENTOLIN HFA) 108 (90 BASE) MCG/ACT inhaler Inhale 1 puff into the lungs every 6 (six) hours as needed for wheezing or shortness of breath.   Yes [provider]  ALPRAZolam Prudy Feeler(XANAX) 1 MG tablet Take 1 mg by mouth daily as needed for anxiety.    Yes [provider]  amphetamine-dextroamphetamine (ADDERALL) 20 MG tablet Take 10 mg by mouth 3 (three) times daily.    Yes [provider]  famotidine (PEPCID) 20 MG tablet Take 1 tablet (20 mg total) by mouth 2 (two) times daily. 11/08/16  Yes Randel PiggSilva Zapata, Dorma RussellEdwin, MD  ibuprofen (ADVIL) 200 MG tablet Take 200 mg by mouth every 6 (six) hours as needed for mild pain.   Yes [provider]  loratadine (CLARITIN) 10 MG tablet Take 1 tablet by mouth daily as needed.   Yes [provider]  temazepam (RESTORIL) 30 MG capsule Take 30 mg by mouth at bedtime as needed. 12/23/19  Yes [provider]  Vitamin D, Cholecalciferol, 50 MCG (2000 UT) CAPS Take 1 tablet by mouth daily.   Yes [provider]  gabapentin (NEURONTIN) 100 MG capsule Take 1 capsule (100 mg total) by mouth 3 (three)  times daily. Patient not taking: Reported on 09/12/2019 11/27/16   Laveda Abbe, NP  hydrocortisone-pramoxine [facility]) 2.5-1 % rectal cream Place 1 application rectally 3 (three) times daily. Patient not taking: Reported on 01/19/2020 09/21/19   Hoy Register, MD  hydrOXYzine (ATARAX/VISTARIL) 25 MG tablet Take 1 tablet (25 mg total) by mouth every 6 (six) hours as needed for anxiety. Patient not taking: Reported on 09/12/2019 11/27/16   Laveda Abbe, NP  polyethylene glycol (MIRALAX MIX-IN PAX) 17 g packet Take 17 g by mouth daily. Patient not taking: Reported on 01/19/2020 09/21/19   Hoy Register, MD  sertraline (ZOLOFT) 25 MG tablet Take 1 tablet (25  mg total) by mouth daily. Patient not taking: Reported on 09/12/2019 11/27/16   Laveda Abbe, NP  traZODone (DESYREL) 50 MG tablet Take 1 tablet (50 mg total) by mouth at bedtime as needed for sleep. Patient not taking: Reported on 01/19/2020 11/27/16   Laveda Abbe, NP    Allergies    Patient has no known allergies.  Review of Systems   Review of Systems  Constitutional: Positive for fatigue. Negative for chills and fever.  HENT: Positive for sore throat and voice change. Negative for mouth sores.   Eyes: Negative for pain and visual disturbance.  Respiratory: Positive for chest tightness, shortness of breath and wheezing. Negative for cough.   Cardiovascular: Negative for chest pain and palpitations.  Gastrointestinal: Negative for abdominal pain, diarrhea, nausea and vomiting.  Genitourinary: Negative for dysuria and hematuria.  Musculoskeletal: Negative for arthralgias and back pain.  Skin: Negative for color change and rash.  Neurological: Positive for headaches. Negative for syncope.  Psychiatric/Behavioral: Positive for agitation. Negative for confusion.  All other systems reviewed and are negative.   Physical Exam Updated Vital Signs BP (!) 132/91   Pulse (!) 107   Temp 98 F (36.7 C) (Oral)   Resp (!) 23   Ht 5\' 2"  (1.575 m)   Wt 77.1 kg   LMP 01/07/2020   SpO2 100%   BMI 31.09 kg/m   Physical Exam Vitals and nursing note reviewed.  Constitutional:      General: She is not in acute distress.    Appearance: She is well-developed.     Comments: Voice is hoarse  HENT:     Head: Normocephalic and atraumatic.     Mouth/Throat:     Lips: Pink.     Mouth: Mucous membranes are moist.     Dentition: Normal dentition. No dental tenderness.     Tongue: No lesions.     Palate: No mass.     Pharynx: Oropharynx is clear. Uvula midline. No pharyngeal swelling, oropharyngeal exudate, posterior oropharyngeal erythema or uvula swelling.  Eyes:      Conjunctiva/sclera: Conjunctivae normal.     Pupils: Pupils are equal, round, and reactive to light.  Cardiovascular:     Rate and Rhythm: Regular rhythm. Tachycardia present.     Pulses: Normal pulses.  Pulmonary:     Effort: Pulmonary effort is normal. No respiratory distress.     Breath sounds: Normal breath sounds.     Comments: Oropharynx non-erythematous.  No tonsillar swelling or exudate.  No uvular deviation.  No drooling. No brawny edema. No stridor. Voice is not muffled. Abdominal:     Palpations: Abdomen is soft.     Tenderness: There is no abdominal tenderness.  Musculoskeletal:     Cervical back: Normal range of motion and neck supple. No rigidity.  Lymphadenopathy:  Cervical: No cervical adenopathy.  Skin:    General: Skin is warm and dry.  Neurological:     General: No focal deficit present.     Mental Status: She is alert and oriented to person, place, and time.     ED Results / Procedures / Treatments   Labs (all labs ordered are listed, but only abnormal results are displayed) Labs Reviewed  BASIC METABOLIC PANEL - Abnormal; Notable for the following components:      Result Value   CO2 20 (*)    Glucose, Bld 148 (*)    All other components within normal limits  CBC  I-STAT BETA HCG BLOOD, ED ([facility], [facility], [facility] ONLY)  TROPONIN I (HIGH SENSITIVITY)  TROPONIN I (HIGH SENSITIVITY)    EKG EKG Interpretation  Date/Time:  Wednesday January 19 2020 06:03:48 EDT Ventricular Rate:  116 PR Interval:  134 QRS Duration: 66 QT Interval:  326 QTC Calculation: 453 R Axis:   88 Text Interpretation: Sinus tachycardia Otherwise normal ECG When compared with ECG of 01/18/2020, No significant change was found Confirmed by Dione Booze ([id number]) on 01/19/2020 7:08:45 AM   Radiology DG Chest 2 View  Result Date: 01/18/2020 CLINICAL DATA:  Chest pain, shortness of breath EXAM: CHEST - 2 VIEW COMPARISON:  06/20/2015 FINDINGS: The heart size and mediastinal contours are  within normal limits. Both lungs are clear. The visualized skeletal structures are unremarkable. IMPRESSION: No active cardiopulmonary disease. Electronically Signed   By: Duanne Guess D.O.   On: 01/18/2020 16:45    Procedures Procedures (including critical care time)  Medications Ordered in ED Medications  sodium chloride flush (NS) 0.9 % injection 3 mL (has no administration in time range)  LORazepam (ATIVAN) tablet 1 mg (1 mg Oral Given 01/19/20 0757)  albuterol (VENTOLIN HFA) 108 (90 Base) MCG/ACT inhaler 2 puff (2 puffs Inhalation Given 01/19/20 0757)    ED Course  I have reviewed the triage vital signs and the nursing notes.  Pertinent labs & imaging results that were available during my care of the patient were reviewed by me and considered in my medical decision making (see chart for details).  24 yo female here with hoarse voice, subjective SOB for 2 months, worsening in the past 2 days.  She does have hoarseness on exam.  No stridor, drooling, or evidence of angioedema or airway compromise.  I explained hoarseness has a broad differential but is most likely related to viral infection (given her fatigue, headache) or reflux-related, as she does suffer from reflux.  I recommended restarting pepcid and talked about reflux precautions.  I also recommended she f/u with ENT given her sx have been ongoing for 2+ months.  I think she would benefit from a largynoscopy to evaluate for larygneal inflammation or paralysis.  I see no evidence of acute impending airway compromise.  She has no stridor on exam.  Doubtful of anaphyalxis or angio-edema.  She has no fever or leukocytosis after 2 weeks to suspect deep-space neck infection.  No signs of PTA or strep throat on my exam.  Regarding her tachycardia, I suspect this is likely anxiety related, with underlying baseline tachycardia.  She had  HR 108 on prior Ed visit in March.  Her HR fluctuates from low 100's to 120's while I am in the  room, along with her anxiety level.  She has no hypoxia or risk factors suggestive for PE.  She does take xanax as needed for anxiety at home, we'll give 1 mg  PO ativan here.  We'll try some albuterol here, as it may help with her subjective SOB.    Final Clinical Impression(s) / ED Diagnoses Final diagnoses:  Hoarseness    Rx / DC Orders ED Discharge Orders    None       Troye Hiemstra, Kermit Balo, MD 01/19/20 1021

## 2020-01-19 NOTE — ED Notes (Signed)
ED Provider at bedside. 

## 2020-01-19 NOTE — ED Notes (Signed)
Pt reports ongoing chest pain, pt pulled back to triage for repeat ekg. resp e/u, nad.

## 2020-01-19 NOTE — Discharge Instructions (Addendum)
I would recommend that you restart taking pepcid 20 mg twice a day for reflux.  Try not to eat 4 hours before bedtime and sleep propped up on at least 2 pillows.  You should schedule an appointment with our ENT doctor.  It is possible you have inflammation of your larynx or vocal cords, and this can be managed by an ENT doctor.  Please call Dr Avel Sensor office today to ask for the next available appointment.  Let them know you were seen in the ER today and told to follow up as soon as possible in the office.  Try to rest your voice for the next several days.  Tea with honey can be soothing as well.

## 2020-04-06 ENCOUNTER — Emergency Department (HOSPITAL_BASED_OUTPATIENT_CLINIC_OR_DEPARTMENT_OTHER): Payer: 59

## 2020-04-06 ENCOUNTER — Other Ambulatory Visit: Payer: Self-pay

## 2020-04-06 ENCOUNTER — Encounter (HOSPITAL_BASED_OUTPATIENT_CLINIC_OR_DEPARTMENT_OTHER): Payer: Self-pay | Admitting: *Deleted

## 2020-04-06 ENCOUNTER — Emergency Department (HOSPITAL_BASED_OUTPATIENT_CLINIC_OR_DEPARTMENT_OTHER)
Admission: EM | Admit: 2020-04-06 | Discharge: 2020-04-06 | Disposition: A | Discharge status: Home / Self Care | Payer: 59 | Attending: Emergency Medicine | Admitting: Emergency Medicine

## 2020-04-06 DIAGNOSIS — M2540 Effusion, unspecified joint: Secondary | ICD-10-CM | POA: Diagnosis not present

## 2020-04-06 DIAGNOSIS — H53149 Visual discomfort, unspecified: Secondary | ICD-10-CM | POA: Diagnosis not present

## 2020-04-06 DIAGNOSIS — R634 Abnormal weight loss: Secondary | ICD-10-CM | POA: Diagnosis not present

## 2020-04-06 DIAGNOSIS — R519 Headache, unspecified: Secondary | ICD-10-CM | POA: Insufficient documentation

## 2020-04-06 DIAGNOSIS — Z5321 Procedure and treatment not carried out due to patient leaving prior to being seen by health care provider: Secondary | ICD-10-CM | POA: Diagnosis not present

## 2020-04-06 LAB — PREGNANCY, URINE: Preg Test, Ur: NEGATIVE

## 2020-04-06 NOTE — ED Triage Notes (Addendum)
Headache x 8 days. She was seen at an UC today and told her visual acuity was 20/100 and she was told to come to the ED. Joint swelling. Light sensitivity. Recent weight loss. She feels like she is in a fog. Pale.

## 2020-04-06 NOTE — ED Notes (Signed)
Pt's name called for room assignment with no response.

## 2020-04-06 NOTE — ED Notes (Signed)
Called pt for reassessment and no answer.

## 2020-04-07 ENCOUNTER — Emergency Department (HOSPITAL_BASED_OUTPATIENT_CLINIC_OR_DEPARTMENT_OTHER)
Admission: EM | Admit: 2020-04-07 | Discharge: 2020-04-07 | Disposition: A | Discharge status: Home / Self Care | Payer: 59 | Attending: Emergency Medicine | Admitting: Emergency Medicine

## 2020-04-07 ENCOUNTER — Other Ambulatory Visit: Payer: Self-pay

## 2020-04-07 ENCOUNTER — Encounter (HOSPITAL_BASED_OUTPATIENT_CLINIC_OR_DEPARTMENT_OTHER): Payer: Self-pay | Admitting: Emergency Medicine

## 2020-04-07 ENCOUNTER — Emergency Department (HOSPITAL_BASED_OUTPATIENT_CLINIC_OR_DEPARTMENT_OTHER): Payer: 59

## 2020-04-07 DIAGNOSIS — J40 Bronchitis, not specified as acute or chronic: Secondary | ICD-10-CM | POA: Insufficient documentation

## 2020-04-07 DIAGNOSIS — Z87891 Personal history of nicotine dependence: Secondary | ICD-10-CM | POA: Diagnosis not present

## 2020-04-07 DIAGNOSIS — Z20822 Contact with and (suspected) exposure to covid-19: Secondary | ICD-10-CM | POA: Diagnosis not present

## 2020-04-07 DIAGNOSIS — J45909 Unspecified asthma, uncomplicated: Secondary | ICD-10-CM | POA: Insufficient documentation

## 2020-04-07 DIAGNOSIS — R0602 Shortness of breath: Secondary | ICD-10-CM | POA: Diagnosis present

## 2020-04-07 DIAGNOSIS — J069 Acute upper respiratory infection, unspecified: Secondary | ICD-10-CM | POA: Insufficient documentation

## 2020-04-07 DIAGNOSIS — R519 Headache, unspecified: Secondary | ICD-10-CM

## 2020-04-07 LAB — CBC WITH DIFFERENTIAL/PLATELET
Abs Immature Granulocytes: 0.04 10*3/uL (ref 0.00–0.07)
Basophils Absolute: 0.1 10*3/uL (ref 0.0–0.1)
Basophils Relative: 1 %
Eosinophils Absolute: 0.7 10*3/uL — ABNORMAL HIGH (ref 0.0–0.5)
Eosinophils Relative: 5 %
HCT: 44.8 % (ref 36.0–46.0)
Hemoglobin: 14.9 g/dL (ref 12.0–15.0)
Immature Granulocytes: 0 %
Lymphocytes Relative: 21 %
Lymphs Abs: 2.7 10*3/uL (ref 0.7–4.0)
MCH: 32.1 pg (ref 26.0–34.0)
MCHC: 33.3 g/dL (ref 30.0–36.0)
MCV: 96.6 fL (ref 80.0–100.0)
Monocytes Absolute: 0.7 10*3/uL (ref 0.1–1.0)
Monocytes Relative: 5 %
Neutro Abs: 8.5 10*3/uL — ABNORMAL HIGH (ref 1.7–7.7)
Neutrophils Relative %: 68 %
Platelets: 333 10*3/uL (ref 150–400)
RBC: 4.64 MIL/uL (ref 3.87–5.11)
RDW: 12.3 % (ref 11.5–15.5)
WBC: 12.6 10*3/uL — ABNORMAL HIGH (ref 4.0–10.5)
nRBC: 0 % (ref 0.0–0.2)

## 2020-04-07 LAB — PREGNANCY, URINE: Preg Test, Ur: NEGATIVE

## 2020-04-07 LAB — BASIC METABOLIC PANEL
Anion gap: 10 (ref 5–15)
BUN: 6 mg/dL (ref 6–20)
CO2: 25 mmol/L (ref 22–32)
Calcium: 9.1 mg/dL (ref 8.9–10.3)
Chloride: 102 mmol/L (ref 98–111)
Creatinine, Ser: 0.62 mg/dL (ref 0.44–1.00)
GFR calc Af Amer: >60 mL/min (ref >60)
GFR calc non Af Amer: >60 mL/min (ref >60)
Glucose, Bld: 90 mg/dL (ref 70–99)
Potassium: 3.6 mmol/L (ref 3.5–5.1)
Sodium: 137 mmol/L (ref 135–145)

## 2020-04-07 LAB — RESPIRATORY PANEL BY RT PCR (FLU A&B, COVID)
Influenza A by PCR: NEGATIVE
Influenza B by PCR: NEGATIVE
SARS Coronavirus 2 by RT PCR: NEGATIVE

## 2020-04-07 MED ORDER — KETOROLAC TROMETHAMINE 30 MG/ML IJ SOLN
30.0000 mg | Freq: Once | INTRAMUSCULAR | Status: AC
  Administered 2020-04-07: 30 mg via INTRAVENOUS
  Filled 2020-04-07: qty 1

## 2020-04-07 MED ORDER — PREDNISONE 50 MG PO TABS: 50.0000 mg | ORAL_TABLET | Freq: Every day | ORAL | 0 refills | Status: DC

## 2020-04-07 MED ORDER — IPRATROPIUM-ALBUTEROL 0.5-2.5 (3) MG/3ML IN SOLN
3.0000 mL | Freq: Once | RESPIRATORY_TRACT | Status: AC
  Administered 2020-04-07: 3 mL via RESPIRATORY_TRACT
  Filled 2020-04-07: qty 3

## 2020-04-07 MED ORDER — DIPHENHYDRAMINE HCL 50 MG/ML IJ SOLN
25.0000 mg | Freq: Once | INTRAMUSCULAR | Status: AC
  Administered 2020-04-07: 25 mg via INTRAVENOUS
  Filled 2020-04-07: qty 1

## 2020-04-07 MED ORDER — ALBUTEROL SULFATE HFA 108 (90 BASE) MCG/ACT IN AERS: 1.0000 | INHALATION_SPRAY | Freq: Four times a day (QID) | RESPIRATORY_TRACT | 0 refills | Status: DC | PRN

## 2020-04-07 MED ORDER — ALBUTEROL SULFATE HFA 108 (90 BASE) MCG/ACT IN AERS
INHALATION_SPRAY | RESPIRATORY_TRACT | Status: AC
  Administered 2020-04-07: 4
  Filled 2020-04-07: qty 6.7

## 2020-04-07 MED ORDER — METHYLPREDNISOLONE SODIUM SUCC 125 MG IJ SOLR
125.0000 mg | Freq: Once | INTRAMUSCULAR | Status: AC
  Administered 2020-04-07: 125 mg via INTRAVENOUS
  Filled 2020-04-07: qty 2

## 2020-04-07 MED ORDER — SODIUM CHLORIDE 0.9 % IV BOLUS
1000.0000 mL | Freq: Once | INTRAVENOUS | Status: AC
  Administered 2020-04-07: 1000 mL via INTRAVENOUS

## 2020-04-07 MED ORDER — PROCHLORPERAZINE EDISYLATE 10 MG/2ML IJ SOLN
10.0000 mg | Freq: Once | INTRAMUSCULAR | Status: AC
  Administered 2020-04-07: 10 mg via INTRAVENOUS
  Filled 2020-04-07: qty 2

## 2020-04-07 NOTE — ED Notes (Signed)
States began having a cough and dyspnea last PM, states lives in apartment and has noted mold in her [facility] system. Appears comfortable with a noted strong cough. Placed on cont POX monitoring with int NBP assessment, ED PA at bedside

## 2020-04-07 NOTE — Discharge Instructions (Signed)
Follow-up with your ophthalmologist if you continue to have blurred vision in your left eye.  Take the medications for your asthma exacerbation  Return for any worsening symptoms.  Continue take Tylenol and ibuprofen for headache

## 2020-04-07 NOTE — ED Notes (Signed)
Pt given her own bottle of water

## 2020-04-07 NOTE — ED Notes (Signed)
In to perform nsg assessment and interview, pt on mobile phone, stated she needed to take phone call

## 2020-04-07 NOTE — ED Notes (Signed)
ED Provider at bedside. 

## 2020-04-07 NOTE — ED Triage Notes (Signed)
Seen last night for the same.  Left without being seen due to wait.  Reports continued headache, now c/o chest pain and SOB.  Notably wheezy at this time.  Also recently found mold in her air conditioning unit.  Noticed it two weeks ago.

## 2020-04-07 NOTE — ED Provider Notes (Signed)
[facility] EMERGENCY DEPARTMENT Provider Note   CSN: [id number] Arrival date & time: 04/07/20  1329    History Chief Complaint  Patient presents with  . Shortness of Breath    Carol Paul is a 24 y.o. female with history significant for asthma who presents for evaluation multiple complaints.  Patient states she has had headache located to left side of her head x8 days.  States she has photosensitivity however no phonophobia.  History of headaches however is unsure if this feels similar.  No recent injury or trauma.  States she feels like intermittently her vision will be blurred.  He does wear glasses and contacts.  No eye pain.  No current vision changes.  Denies visual field cuts, diplopia. Feels pressure to left upper face. No prior history of MS. Patient also states she has been having chest tightness and shortness of breath over the last 2 days.  Has history of asthma states this feels similar.  Recently found mold in the [facility] vents.  No prior intubations or hospitalizations for asthma.  Has been using her albuterol inhaler every 3 hours without relief.  Has had a nonproductive cough.  States her chest feels tight.  No fever, chills, nausea, vomiting, hemoptysis, abdominal pain, diarrhea, dysuria, lateral leg swelling, redness or warmth.  No for history of PE, DVT, recent surgery, immobilization, malignancy.  Denies aggravating or alleviating factors.  History obtained from patient and past medical records. No interpretor was used.  HPI     Past Medical History:  Diagnosis Date  . Asthma     Patient Active Problem List   Diagnosis Date Noted  . MDD (major depressive disorder) 11/26/2016  . Nausea vomiting and diarrhea 11/07/2016  . Abdominal pain 11/07/2016  . Lactic acid increased 11/07/2016  . Leukocytosis 11/07/2016  . SIRS (systemic inflammatory response syndrome) (HCC) 11/07/2016    Past Surgical History:  Procedure Laterality Date  . TONSILLECTOMY        OB History   No obstetric history on file.     Family History  Problem Relation Age of Onset  . CAD Other   . Diabetes Mother     Social History   Tobacco Use  . Smoking status: Former Games developer  . Smokeless tobacco: Never Used  Vaping Use  . Vaping Use: Never used  Substance Use Topics  . Alcohol use: No    Comment: denies  . Drug use: Yes    Types: Marijuana    Home Medications Prior to Admission medications   Medication Sig Start Date End Date Taking? Authorizing Provider  ALPRAZolam Prudy Feeler) 1 MG tablet Take 1 mg by mouth daily as needed for anxiety.    Yes [provider]  amphetamine-dextroamphetamine (ADDERALL) 20 MG tablet Take 20 mg by mouth See admin instructions. 2-3 times daily.   Yes [provider]  famotidine (PEPCID) 20 MG tablet Take 1 tablet (20 mg total) by mouth 2 (two) times daily. Patient taking differently: Take 20 mg by mouth 2 (two) times daily as needed for heartburn.  11/08/16  Yes Randel Pigg, Dorma Russell, MD  ibuprofen (ADVIL) 200 MG tablet Take 200 mg by mouth every 6 (six) hours as needed for mild pain.   Yes [provider]  loratadine (CLARITIN) 10 MG tablet Take 1 tablet by mouth daily as needed.   Yes [provider]  albuterol (VENTOLIN HFA) 108 (90 Base) MCG/ACT inhaler Inhale 1-2 puffs into the lungs every 6 (six) hours as needed  for wheezing or shortness of breath. 04/07/20   Yoandri Congrove A, PA-C  predniSONE (DELTASONE) 50 MG tablet Take 1 tablet (50 mg total) by mouth daily. 04/07/20   Deniz Hannan A, PA-C    Allergies    Other  Review of Systems   Review of Systems  Constitutional: Negative.   HENT: Negative.   Respiratory: Positive for cough, chest tightness, shortness of breath and wheezing. Negative for apnea, choking and stridor.   Cardiovascular: Negative.   Gastrointestinal: Negative.   Genitourinary: Negative.   Musculoskeletal: Negative.   Skin: Negative.   Neurological: Positive  for headaches. Negative for dizziness, tremors, seizures, syncope, facial asymmetry, speech difficulty, weakness, light-headedness and numbness.  All other systems reviewed and are negative.   Physical Exam Updated Vital Signs BP 117/82 (BP Location: Left Arm)   Pulse 100   Temp 97.6 F (36.4 C) (Oral)   Resp 20   Ht 5\' 2"  (1.575 m)   Wt 65.8 kg   LMP 04/03/2020   SpO2 98%   BMI 26.52 kg/m   Physical Exam Vitals and nursing note reviewed.  Constitutional:      General: She is not in acute distress.    Appearance: She is not ill-appearing, toxic-appearing or diaphoretic.  HENT:     Head: Normocephalic and atraumatic.     Jaw: There is normal jaw occlusion.     Right Ear: Tympanic membrane, ear canal and external ear normal. There is no impacted cerumen. No hemotympanum. Tympanic membrane is not injected, scarred, perforated, erythematous, retracted or bulging.     Left Ear: Tympanic membrane, ear canal and external ear normal. There is no impacted cerumen. No hemotympanum. Tympanic membrane is not injected, scarred, perforated, erythematous, retracted or bulging.     Ears:     Comments: No Mastoid tenderness.    Nose:     Comments: Clear rhinorrhea and congestion to bilateral nares.  No sinus tenderness.    Mouth/Throat:     Comments: Posterior oropharynx clear.  Mucous membranes moist.  Tonsils without erythema or exudate.  Uvula midline without deviation.  No evidence of PTA or RPA.  No drooling, dysphasia or trismus.  Phonation normal. Neck:     Trachea: Trachea and phonation normal.     Meningeal: Brudzinski's sign and Kernig's sign absent.     Comments: No Neck stiffness or neck rigidity.  No meningismus.  No cervical lymphadenopathy. Cardiovascular:     Comments: No murmurs rubs or gallops. Pulmonary:     Effort: Pulmonary effort is normal. Tachypnea present.     Breath sounds: Wheezing present.     Comments: Inspiratory and expiratory wheeze.  No accessory muscle  usage.  Able speak in full sentences. Chest:     Comments: No crepitus or step-offs. Abdominal:     Comments: Soft, nontender without rebound or guarding.  No CVA tenderness.  Musculoskeletal:     Comments: Moves all 4 extremities without difficulty.  Lower extremities without edema, erythema or warmth.  Skin:    Comments: Brisk capillary refill.  No rashes or lesions.  Neurological:     General: No focal deficit present.     Mental Status: She is alert.     Cranial Nerves: Cranial nerves are intact.     Sensory: Sensation is intact.     Motor: Motor function is intact.     Coordination: Coordination is intact.     Gait: Gait is intact.     Comments: Mental Status:  Alert, oriented,  thought content appropriate. Speech fluent without evidence of aphasia. Able to follow 2 step commands without difficulty.  Cranial Nerves:  II:  Peripheral visual fields grossly normal, pupils equal, round, reactive to light III,IV, VI: ptosis not present, extra-ocular motions intact bilaterally  V,VII: smile symmetric, facial light touch sensation equal VIII: hearing grossly normal bilaterally  IX,X: midline uvula rise  XI: bilateral shoulder shrug equal and strong XII: midline tongue extension  Motor:  5/5 in upper and lower extremities bilaterally including strong and equal grip strength and dorsiflexion/plantar flexion Sensory: Pinprick and light touch normal in all extremities.  Deep Tendon Reflexes: 2+ and symmetric  Cerebellar: normal finger-to-nose with bilateral upper extremities Gait: normal gait and balance CV: distal pulses palpable throughout      ED Results / Procedures / Treatments   Labs (all labs ordered are listed, but only abnormal results are displayed) Labs Reviewed  CBC WITH DIFFERENTIAL/PLATELET - Abnormal; Notable for the following components:      Result Value   WBC 12.6 (*)    Neutro Abs 8.5 (*)    Eosinophils Absolute 0.7 (*)    All other components within normal  limits  RESPIRATORY PANEL BY RT PCR (FLU A&B, COVID)  BASIC METABOLIC PANEL  PREGNANCY, URINE    EKG None  Radiology DG Chest 2 View  Result Date: 04/07/2020 CLINICAL DATA:  Shortness of breath.  Wheezing.  History of asthma. EXAM: CHEST - 2 VIEW COMPARISON:  01/18/2020 FINDINGS: The cardiomediastinal contours are normal. Mild peribronchial thickening. Pulmonary vasculature is normal. No consolidation, pleural effusion, or pneumothorax. No evidence of pneumomediastinum. No acute osseous abnormalities are seen. IMPRESSION: Mild peribronchial thickening, can be seen with asthma or bronchitis. Electronically Signed   By: Narda Rutherford M.D.   On: 04/07/2020 16:55   CT Head Wo Contrast  Result Date: 04/06/2020 CLINICAL DATA:  Headache, new or worsening. Additional history provided: Patient reports headache for 8 days, light sensitivity. EXAM: CT HEAD WITHOUT CONTRAST TECHNIQUE: Contiguous axial images were obtained from the base of the skull through the vertex without intravenous contrast. COMPARISON:  Head CT 06/20/2015. FINDINGS: Brain: Cerebral volume is normal. There is no acute intracranial hemorrhage. No demarcated cortical infarct. No extra-axial fluid collection. No evidence of intracranial mass. No midline shift. Vascular: No hyperdense vessel. Skull: Normal. Negative for fracture or focal lesion. Sinuses/Orbits: Visualized orbits show no acute finding. No significant paranasal sinus disease or mastoid effusion at the imaged levels. IMPRESSION: Unremarkable non-contrast CT appearance of the brain. No evidence of acute intracranial abnormality. Electronically Signed   By: Jackey Loge DO   On: 04/06/2020 17:27    Procedures Procedures (including critical care time)  Medications Ordered in ED Medications  albuterol (VENTOLIN HFA) 108 (90 Base) MCG/ACT inhaler (4 puffs  Given by Other 04/07/20 1403)  methylPREDNISolone sodium succinate (SOLU-MEDROL) 125 mg/2 mL injection 125 mg (125 mg  Intravenous Given 04/07/20 1631)  ipratropium-albuterol (DUONEB) 0.5-2.5 (3) MG/3ML nebulizer solution 3 mL (3 mLs Nebulization Given 04/07/20 1648)  sodium chloride 0.9 % bolus 1,000 mL (1,000 mLs Intravenous New Bag/Given 04/07/20 1630)  ketorolac (TORADOL) 30 MG/ML injection 30 mg (30 mg Intravenous Given 04/07/20 1718)  prochlorperazine (COMPAZINE) injection 10 mg (10 mg Intravenous Given 04/07/20 1717)  diphenhydrAMINE (BENADRYL) injection 25 mg (25 mg Intravenous Given 04/07/20 1717)   ED Course  I have reviewed the triage vital signs and the nursing notes.  Pertinent labs & imaging results that were available during my care of the patient were  reviewed by me and considered in my medical decision making (see chart for details).   Went to assess patient. Apparently had to take a phone call and not ready to be assessed, per patient " Come back later."  24 year old female presents for evaluation multiple complaints.  With headache x8 days.  No recent injury or trauma.  Pain located to left side of head.  Will intermittently have vision changes however none currently.  Patient with nonfocal neuro exam without deficits.  Does have history of headache.  Patient also with chest tightness and shortness of breath.  Notable inspiratory and expiratory wheeze on initial evaluation with respiratory.  Was given albuterol.  Unilateral leg swelling, redness or warmth.  She is tachycardic who is been using her albuterol inhaler every 2 hours at home.  Low suspicion for PE, ACS, dissection, bacterial infectious process causing sepsis.  Came to emergency department yesterday evening had a CT head however was not evaluated by provider due to long wait times.  Plan on labs, imaging and reassess.  Labs and imaging personally reviewed and interpreted:  CT head obtained last night did not show any significant abnormalities.  Patient without any neck pain, stiffness or rigidity.  She is afebrile.  Low suspicion for  meningitis  CBC leukocytosis at 12.6 Metabolic panel without electrolyte, renal normality Pregnancy test negative Flu/Covid negative Chest x-ray consistent with bronchitis/asthma  1745: Patient reassessed after Solu-Medrol, DuoNeb.  Lungs clear to auscultation bilaterally.  Headache has significantly improved.  She continues nonfocal neuro exam without deficits.  She is without any vision changes.  I have low suspicion for acute angle glaucoma, dissection, CVA, ICH, MS exacerbation/optic neuritis, mass as cause of her HA.  Likely migraine.   Patient shortness of breath likely bronchitis versus asthma exacerbation.  Will DC home on steroids.  She is ambulatory without any hypoxia.  Do not feel she needs antibiotics at this time is low suspicion for pneumonia, infectious process. Low suspicion for acute ACS, PE, dissection at this time as cause of symptoms given resolved with steroids and DuoNeb  Discussed possible viral upper respiratory infection given cough, shortness of breath as well as some headache and myalgias.  The patient has been appropriately medically screened and/or stabilized in the ED. I have low suspicion for any other emergent medical condition which would require further screening, evaluation or treatment in the ED or require inpatient management.  Patient is hemodynamically stable and in no acute distress.  Patient able to ambulate in department prior to ED.  Evaluation does not show acute pathology that would require ongoing or additional emergent interventions while in the emergency department or further inpatient treatment.  I have discussed the diagnosis with the patient and answered all questions.  Pain is been managed while in the emergency department and patient has no further complaints prior to discharge.  Patient is comfortable with plan discussed in room and is stable for discharge at this time.  I have discussed strict return precautions for returning to the emergency  department.  Patient was encouraged to follow-up with PCP/specialist refer to at discharge.     MDM Rules/Calculators/A&P                          Carol Paul was evaluated in Emergency Department on 04/07/2020 for the symptoms described in the history of present illness. She was evaluated in the context of the global COVID-19 pandemic, which necessitated consideration that the patient  might be at risk for infection with the SARS-CoV-2 virus that causes COVID-19. Institutional protocols and algorithms that pertain to the evaluation of patients at risk for COVID-19 are in a state of rapid change based on information released by regulatory bodies including the CDC and federal and state organizations. These policies and algorithms were followed during the patient's care in the ED.  Final Clinical Impression(s) / ED Diagnoses Final diagnoses:  Bronchitis  Viral URI with cough  Acute nonintractable headache, unspecified headache type    Rx / DC Orders ED Discharge Orders         Ordered    predniSONE (DELTASONE) 50 MG tablet  Daily        04/07/20 1756    albuterol (VENTOLIN HFA) 108 (90 Base) MCG/ACT inhaler  Every 6 hours PRN        04/07/20 1756           Hayleen Clinkscales A, PA-C 04/07/20 1759    Terrilee Files, MD 04/08/20 1133

## 2020-04-27 DIAGNOSIS — F900 Attention-deficit hyperactivity disorder, predominantly inattentive type: Secondary | ICD-10-CM | POA: Insufficient documentation

## 2021-01-10 ENCOUNTER — Encounter (HOSPITAL_BASED_OUTPATIENT_CLINIC_OR_DEPARTMENT_OTHER): Payer: Self-pay | Admitting: Emergency Medicine

## 2021-01-10 ENCOUNTER — Other Ambulatory Visit: Payer: Self-pay

## 2021-01-10 ENCOUNTER — Emergency Department (HOSPITAL_BASED_OUTPATIENT_CLINIC_OR_DEPARTMENT_OTHER)
Admission: EM | Admit: 2021-01-10 | Discharge: 2021-01-10 | Disposition: A | Discharge status: Home / Self Care | Payer: Self-pay | Attending: Emergency Medicine | Admitting: Emergency Medicine

## 2021-01-10 DIAGNOSIS — R059 Cough, unspecified: Secondary | ICD-10-CM | POA: Insufficient documentation

## 2021-01-10 DIAGNOSIS — J029 Acute pharyngitis, unspecified: Secondary | ICD-10-CM | POA: Insufficient documentation

## 2021-01-10 DIAGNOSIS — J45909 Unspecified asthma, uncomplicated: Secondary | ICD-10-CM | POA: Insufficient documentation

## 2021-01-10 DIAGNOSIS — N3000 Acute cystitis without hematuria: Secondary | ICD-10-CM | POA: Insufficient documentation

## 2021-01-10 DIAGNOSIS — R519 Headache, unspecified: Secondary | ICD-10-CM | POA: Insufficient documentation

## 2021-01-10 DIAGNOSIS — Z87891 Personal history of nicotine dependence: Secondary | ICD-10-CM | POA: Insufficient documentation

## 2021-01-10 DIAGNOSIS — Z20822 Contact with and (suspected) exposure to covid-19: Secondary | ICD-10-CM | POA: Insufficient documentation

## 2021-01-10 DIAGNOSIS — R112 Nausea with vomiting, unspecified: Secondary | ICD-10-CM | POA: Insufficient documentation

## 2021-01-10 LAB — URINALYSIS, ROUTINE W REFLEX MICROSCOPIC
Bilirubin Urine: NEGATIVE
Glucose, UA: NEGATIVE mg/dL
Hgb urine dipstick: NEGATIVE
Ketones, ur: NEGATIVE mg/dL
Nitrite: POSITIVE — AB
Specific Gravity, Urine: 1.014 (ref 1.005–1.030)
WBC, UA: >50 WBC/hpf — ABNORMAL HIGH (ref 0–5)
pH: 7.5 (ref 5.0–8.0)

## 2021-01-10 LAB — COMPREHENSIVE METABOLIC PANEL
ALT: 11 U/L (ref 0–44)
AST: 13 U/L — ABNORMAL LOW (ref 15–41)
Albumin: 4.6 g/dL (ref 3.5–5.0)
Alkaline Phosphatase: 37 U/L — ABNORMAL LOW (ref 38–126)
Anion gap: 7 (ref 5–15)
BUN: 5 mg/dL — ABNORMAL LOW (ref 6–20)
CO2: 27 mmol/L (ref 22–32)
Calcium: 9.5 mg/dL (ref 8.9–10.3)
Chloride: 104 mmol/L (ref 98–111)
Creatinine, Ser: 0.53 mg/dL (ref 0.44–1.00)
GFR, Estimated: >60 mL/min (ref >60)
Glucose, Bld: 86 mg/dL (ref 70–99)
Potassium: 3.8 mmol/L (ref 3.5–5.1)
Sodium: 138 mmol/L (ref 135–145)
Total Bilirubin: 0.7 mg/dL (ref 0.3–1.2)
Total Protein: 7 g/dL (ref 6.5–8.1)

## 2021-01-10 LAB — CBC WITH DIFFERENTIAL/PLATELET
Abs Immature Granulocytes: 0.01 10*3/uL (ref 0.00–0.07)
Basophils Absolute: 0 10*3/uL (ref 0.0–0.1)
Basophils Relative: 0 %
Eosinophils Absolute: 0.1 10*3/uL (ref 0.0–0.5)
Eosinophils Relative: 2 %
HCT: 39.5 % (ref 36.0–46.0)
Hemoglobin: 13.2 g/dL (ref 12.0–15.0)
Immature Granulocytes: 0 %
Lymphocytes Relative: 29 %
Lymphs Abs: 2 10*3/uL (ref 0.7–4.0)
MCH: 32.8 pg (ref 26.0–34.0)
MCHC: 33.4 g/dL (ref 30.0–36.0)
MCV: 98 fL (ref 80.0–100.0)
Monocytes Absolute: 0.3 10*3/uL (ref 0.1–1.0)
Monocytes Relative: 5 %
Neutro Abs: 4.5 10*3/uL (ref 1.7–7.7)
Neutrophils Relative %: 64 %
Platelets: 249 10*3/uL (ref 150–400)
RBC: 4.03 MIL/uL (ref 3.87–5.11)
RDW: 12.6 % (ref 11.5–15.5)
WBC: 7 10*3/uL (ref 4.0–10.5)
nRBC: 0 % (ref 0.0–0.2)

## 2021-01-10 LAB — RESP PANEL BY RT-PCR (FLU A&B, COVID) ARPGX2
Influenza A by PCR: NEGATIVE
Influenza B by PCR: NEGATIVE
SARS Coronavirus 2 by RT PCR: NEGATIVE

## 2021-01-10 LAB — LIPASE, BLOOD: Lipase: 38 U/L (ref 11–51)

## 2021-01-10 LAB — PREGNANCY, URINE: Preg Test, Ur: NEGATIVE

## 2021-01-10 MED ORDER — NITROFURANTOIN MONOHYD MACRO 100 MG PO CAPS: 100.0000 mg | ORAL_CAPSULE | Freq: Two times a day (BID) | ORAL | 0 refills | Status: AC

## 2021-01-10 MED ORDER — PROMETHAZINE HCL 25 MG RE SUPP: 25.0000 mg | Freq: Three times a day (TID) | RECTAL | 0 refills | Status: DC | PRN

## 2021-01-10 MED ORDER — METOCLOPRAMIDE HCL 5 MG/ML IJ SOLN
10.0000 mg | Freq: Once | INTRAMUSCULAR | Status: AC
  Administered 2021-01-10: 10 mg via INTRAVENOUS
  Filled 2021-01-10: qty 2

## 2021-01-10 MED ORDER — SODIUM CHLORIDE 0.9 % IV SOLN
1.0000 g | Freq: Once | INTRAVENOUS | Status: AC
  Administered 2021-01-10: 1 g via INTRAVENOUS
  Filled 2021-01-10: qty 10

## 2021-01-10 MED ORDER — SODIUM CHLORIDE 0.9 % IV BOLUS
500.0000 mL | Freq: Once | INTRAVENOUS | Status: AC
  Administered 2021-01-10: 500 mL via INTRAVENOUS

## 2021-01-10 NOTE — ED Triage Notes (Signed)
Pt arrives to ED with c/o of nausea and vomiting x4 days, worse yesterday with x10 episodes of emesis. Pt reports bloating/diarrhea/constipation for "awhile" now. Recent 15lb unintentional weight loss. Pt also reports UTI dx x2 month ago and she feels like "it never really went away." C/o of urinary urgency and frequency. Pt reports urinary odor and frothy urine.

## 2021-01-10 NOTE — ED Notes (Signed)
Drank one cup of Gatorade with out dificulty

## 2021-01-10 NOTE — ED Provider Notes (Signed)
[facility] EMERGENCY DEPT Provider Note   CSN: [id number] Arrival date & time: 01/10/21  1017     History Chief Complaint  Patient presents with   Emesis   Urinary Frequency    Carol Paul is a 25 y.o. female present emergency department with nausea and vomiting for 2 weeks.  The patient reports that she has had difficulty keeping down any food or water for the past several days, although she has had epigastric discomfort and vomiting for about 10 days.  She said this feels similar to episode she had as a teenager, where she had "vomiting issues".  She denies any diagnosis of gastroparesis, does not follow with a GI doctor.  She does report alternating constipation and diarrhea at home.  She also reports dysuria and burning with urination, reporting that she been treated for possible UTI 2 months ago with Keflex, but her symptoms never really went away.  She also reports that she has had a headache, sore throat, mild cough for the past several days.  She denies any history of abdominal surgeries.  She does report that she smokes marijuana frequently, nearly daily.  She did try to cut back the past couple days.  HPI     Past Medical History:  Diagnosis Date   Asthma     Patient Active Problem List   Diagnosis Date Noted   MDD (major depressive disorder) 11/26/2016   Nausea vomiting and diarrhea 11/07/2016   Abdominal pain 11/07/2016   Lactic acid increased 11/07/2016   Leukocytosis 11/07/2016   SIRS (systemic inflammatory response syndrome) (HCC) 11/07/2016    Past Surgical History:  Procedure Laterality Date   TONSILLECTOMY       OB History   No obstetric history on file.     Family History  Problem Relation Age of Onset   CAD Other    Diabetes Mother     Social History   Tobacco Use   Smoking status: Former    Pack years: 0.00   Smokeless tobacco: Never  Vaping Use   Vaping Use: Never used  Substance Use Topics   Alcohol use: No     Comment: denies   Drug use: Yes    Types: Marijuana    Home Medications Prior to Admission medications   Medication Sig Start Date End Date Taking? Authorizing Provider  ALPRAZolam Prudy Feeler) 1 MG tablet Take 1 mg by mouth daily as needed for anxiety.    Yes [provider]  methylphenidate (RITALIN) 20 MG tablet Take 20 mg by mouth 3 (three) times daily. 12/15/20  Yes [provider]  nitrofurantoin, macrocrystal-monohydrate, (MACROBID) 100 MG capsule Take 1 capsule (100 mg total) by mouth 2 (two) times daily for 5 days. 01/11/21 01/16/21 Yes Everlee Quakenbush, Kermit Balo, MD  promethazine (PHENERGAN) 25 MG suppository Place 1 suppository (25 mg total) rectally every 8 (eight) hours as needed for up to 12 doses for nausea or vomiting. 01/10/21  Yes Terald Sleeper, MD  albuterol (VENTOLIN HFA) 108 (90 Base) MCG/ACT inhaler Inhale 1-2 puffs into the lungs every 6 (six) hours as needed for wheezing or shortness of breath. 04/07/20   Henderly, Britni A, PA-C  amphetamine-dextroamphetamine (ADDERALL) 20 MG tablet Take 20 mg by mouth See admin instructions. 2-3 times daily.    [provider]  famotidine (PEPCID) 20 MG tablet Take 1 tablet (20 mg total) by mouth 2 (two) times daily. Patient taking differently: Take 20 mg by mouth 2 (two) times daily as needed  for heartburn.  11/08/16   Lenox Ponds, MD  ibuprofen (ADVIL) 200 MG tablet Take 200 mg by mouth every 6 (six) hours as needed for mild pain.    [provider]  loratadine (CLARITIN) 10 MG tablet Take 1 tablet by mouth daily as needed.    [provider]  predniSONE (DELTASONE) 50 MG tablet Take 1 tablet (50 mg total) by mouth daily. 04/07/20   Henderly, Britni A, PA-C    Allergies    Other  Review of Systems   Review of Systems  Constitutional:  Positive for fever. Negative for chills.  HENT:  Positive for sore throat. Negative for congestion.   Eyes:  Negative for pain and visual disturbance.   Respiratory:  Negative for cough and shortness of breath.   Cardiovascular:  Negative for chest pain and palpitations.  Gastrointestinal:  Positive for abdominal pain, nausea and vomiting. Negative for constipation and diarrhea.  Genitourinary:  Positive for dysuria. Negative for hematuria.  Musculoskeletal:  Negative for arthralgias and back pain.  Skin:  Negative for color change and rash.  Neurological:  Positive for headaches. Negative for syncope.  All other systems reviewed and are negative.  Physical Exam Updated Vital Signs BP 90/62 (BP Location: Left Arm)   Pulse 76   Temp 98.7 F (37.1 C) (Oral)   Resp 18   Ht 5\' 2"  (1.575 m)   Wt 54.4 kg   SpO2 100%   BMI 21.95 kg/m   Physical Exam Constitutional:      General: She is not in acute distress. HENT:     Head: Normocephalic and atraumatic.  Eyes:     Conjunctiva/sclera: Conjunctivae normal.     Pupils: Pupils are equal, round, and reactive to light.  Cardiovascular:     Rate and Rhythm: Normal rate and regular rhythm.  Pulmonary:     Effort: Pulmonary effort is normal. No respiratory distress.  Abdominal:     General: There is no distension.     Tenderness: There is abdominal tenderness in the epigastric area. There is no right CVA tenderness, left CVA tenderness, guarding or rebound. Negative signs include Murphy's sign and McBurney's sign.  Skin:    General: Skin is warm and dry.  Neurological:     General: No focal deficit present.     Mental Status: She is alert. Mental status is at baseline.  Psychiatric:        Mood and Affect: Mood normal.        Behavior: Behavior normal.    ED Results / Procedures / Treatments   Labs (all labs ordered are listed, but only abnormal results are displayed) Labs Reviewed  URINALYSIS, ROUTINE W REFLEX MICROSCOPIC - Abnormal; Notable for the following components:      Result Value   APPearance HAZY (*)    Protein, ur TRACE (*)    Nitrite POSITIVE (*)     Leukocytes,Ua LARGE (*)    WBC, UA >50 (*)    Bacteria, UA MANY (*)    All other components within normal limits  COMPREHENSIVE METABOLIC PANEL - Abnormal; Notable for the following components:   BUN 5 (*)    AST 13 (*)    Alkaline Phosphatase 37 (*)    All other components within normal limits  RESP PANEL BY RT-PCR (FLU A&B, COVID) ARPGX2  URINE CULTURE  PREGNANCY, URINE  CBC WITH DIFFERENTIAL/PLATELET  LIPASE, BLOOD    EKG None  Radiology No results found.  Procedures Procedures  Medications Ordered in ED Medications  cefTRIAXone (ROCEPHIN) 1 g in sodium chloride 0.9 % 100 mL IVPB (0 g Intravenous Stopped 01/10/21 1530)  metoCLOPramide (REGLAN) injection 10 mg (10 mg Intravenous Given 01/10/21 1221)  sodium chloride 0.9 % bolus 500 mL (500 mLs Intravenous New Bag/Given 01/10/21 1218)    ED Course  I have reviewed the triage vital signs and the nursing notes.  Pertinent labs & imaging results that were available during my care of the patient were reviewed by me and considered in my medical decision making (see chart for details).  This patient complains of nausea, vomiting, dysuria.  This involves an extensive number of treatment options, and is a complaint that carries with it a high risk of complications and morbidity.  The differential diagnosis includes urinary tract infection versus gastroparesis versus hyperemesis syndrome related to marijuana vs other  A lower suspicion for acute biliary disease, negative Murphy sign no focal right upper quadrant tenderness.  Likewise have a lower suspicion for acute appendicitis.  I ordered, reviewed, and interpreted labs.  UA suggestive of urinary infection.  We will send a culture.  Labs showing unremarkable CMP, CBC I ordered medication IV fluids, IV Reglan, and IV Rocephin for hydration, nausea, and likely UTI   Clinical Course as of 01/10/21 1739  Wed Jan 10, 2021  1424 Feeling better we'll PO challenge now [MT]  1455  Tolerated PO well, okay for discharge [MT]    Clinical Course User Index [MT] Terald Sleeper, MD    Final Clinical Impression(s) / ED Diagnoses Final diagnoses:  Acute cystitis without hematuria  Non-intractable vomiting with nausea, unspecified vomiting type    Rx / DC Orders ED Discharge Orders          Ordered    nitrofurantoin, macrocrystal-monohydrate, (MACROBID) 100 MG capsule  2 times daily        01/10/21 1457    promethazine (PHENERGAN) 25 MG suppository  Every 8 hours PRN        01/10/21 1457             Terald Sleeper, MD 01/10/21 1739

## 2021-01-10 NOTE — Discharge Instructions (Addendum)
Please avoid smoking marijuana as this can trigger your vomiting.  You will need 5 more days of antibiotics, which were prescribed here.

## 2021-01-12 LAB — URINE CULTURE: Culture: >=100000 — AB

## 2021-03-01 ENCOUNTER — Emergency Department (HOSPITAL_BASED_OUTPATIENT_CLINIC_OR_DEPARTMENT_OTHER): Payer: Self-pay

## 2021-03-01 ENCOUNTER — Other Ambulatory Visit: Payer: Self-pay

## 2021-03-01 ENCOUNTER — Emergency Department (HOSPITAL_BASED_OUTPATIENT_CLINIC_OR_DEPARTMENT_OTHER)
Admission: EM | Admit: 2021-03-01 | Discharge: 2021-03-01 | Disposition: A | Discharge status: Home / Self Care | Payer: Self-pay | Attending: Emergency Medicine | Admitting: Emergency Medicine

## 2021-03-01 ENCOUNTER — Other Ambulatory Visit (HOSPITAL_BASED_OUTPATIENT_CLINIC_OR_DEPARTMENT_OTHER): Payer: Self-pay

## 2021-03-01 ENCOUNTER — Encounter (HOSPITAL_BASED_OUTPATIENT_CLINIC_OR_DEPARTMENT_OTHER): Payer: Self-pay

## 2021-03-01 DIAGNOSIS — Z87891 Personal history of nicotine dependence: Secondary | ICD-10-CM | POA: Insufficient documentation

## 2021-03-01 DIAGNOSIS — N3000 Acute cystitis without hematuria: Secondary | ICD-10-CM

## 2021-03-01 DIAGNOSIS — R300 Dysuria: Secondary | ICD-10-CM

## 2021-03-01 DIAGNOSIS — J45909 Unspecified asthma, uncomplicated: Secondary | ICD-10-CM | POA: Insufficient documentation

## 2021-03-01 DIAGNOSIS — R1032 Left lower quadrant pain: Secondary | ICD-10-CM

## 2021-03-01 DIAGNOSIS — B9689 Other specified bacterial agents as the cause of diseases classified elsewhere: Secondary | ICD-10-CM | POA: Insufficient documentation

## 2021-03-01 DIAGNOSIS — N739 Female pelvic inflammatory disease, unspecified: Secondary | ICD-10-CM

## 2021-03-01 DIAGNOSIS — R109 Unspecified abdominal pain: Secondary | ICD-10-CM

## 2021-03-01 DIAGNOSIS — R102 Pelvic and perineal pain: Secondary | ICD-10-CM

## 2021-03-01 LAB — CBC WITH DIFFERENTIAL/PLATELET
Abs Immature Granulocytes: 0.02 10*3/uL (ref 0.00–0.07)
Basophils Absolute: 0.1 10*3/uL (ref 0.0–0.1)
Basophils Relative: 1 %
Eosinophils Absolute: 0.2 10*3/uL (ref 0.0–0.5)
Eosinophils Relative: 2 %
HCT: 38.9 % (ref 36.0–46.0)
Hemoglobin: 13.1 g/dL (ref 12.0–15.0)
Immature Granulocytes: 0 %
Lymphocytes Relative: 27 %
Lymphs Abs: 2.5 10*3/uL (ref 0.7–4.0)
MCH: 32.6 pg (ref 26.0–34.0)
MCHC: 33.7 g/dL (ref 30.0–36.0)
MCV: 96.8 fL (ref 80.0–100.0)
Monocytes Absolute: 0.6 10*3/uL (ref 0.1–1.0)
Monocytes Relative: 7 %
Neutro Abs: 5.9 10*3/uL (ref 1.7–7.7)
Neutrophils Relative %: 63 %
Platelets: 259 10*3/uL (ref 150–400)
RBC: 4.02 MIL/uL (ref 3.87–5.11)
RDW: 11.9 % (ref 11.5–15.5)
WBC: 9.3 10*3/uL (ref 4.0–10.5)
nRBC: 0 % (ref 0.0–0.2)

## 2021-03-01 LAB — URINALYSIS, ROUTINE W REFLEX MICROSCOPIC
Bilirubin Urine: NEGATIVE
Glucose, UA: NEGATIVE mg/dL
Hgb urine dipstick: NEGATIVE
Ketones, ur: NEGATIVE mg/dL
Nitrite: POSITIVE — AB
Specific Gravity, Urine: 1.018 (ref 1.005–1.030)
WBC, UA: >50 WBC/hpf — ABNORMAL HIGH (ref 0–5)
pH: 6.5 (ref 5.0–8.0)

## 2021-03-01 LAB — WET PREP, GENITAL
Clue Cells Wet Prep HPF POC: NONE SEEN
Sperm: NONE SEEN
Trich, Wet Prep: NONE SEEN
Yeast Wet Prep HPF POC: NONE SEEN

## 2021-03-01 LAB — BASIC METABOLIC PANEL
Anion gap: 9 (ref 5–15)
BUN: 10 mg/dL (ref 6–20)
CO2: 26 mmol/L (ref 22–32)
Calcium: 9.4 mg/dL (ref 8.9–10.3)
Chloride: 106 mmol/L (ref 98–111)
Creatinine, Ser: 0.68 mg/dL (ref 0.44–1.00)
GFR, Estimated: >60 mL/min (ref >60)
Glucose, Bld: 108 mg/dL — ABNORMAL HIGH (ref 70–99)
Potassium: 3.6 mmol/L (ref 3.5–5.1)
Sodium: 141 mmol/L (ref 135–145)

## 2021-03-01 LAB — PREGNANCY, URINE: Preg Test, Ur: NEGATIVE

## 2021-03-01 MED ORDER — ONDANSETRON HCL 4 MG/2ML IJ SOLN
4.0000 mg | Freq: Once | INTRAMUSCULAR | Status: AC
  Administered 2021-03-01: 4 mg via INTRAVENOUS
  Filled 2021-03-01: qty 2

## 2021-03-01 MED ORDER — SODIUM CHLORIDE 0.9 % IV BOLUS
500.0000 mL | Freq: Once | INTRAVENOUS | Status: AC
  Administered 2021-03-01: 500 mL via INTRAVENOUS

## 2021-03-01 MED ORDER — IOHEXOL 350 MG/ML SOLN
50.0000 mL | Freq: Once | INTRAVENOUS | Status: AC | PRN
  Administered 2021-03-01: 50 mL via INTRAVENOUS

## 2021-03-01 MED ORDER — CIPROFLOXACIN HCL 500 MG PO TABS
500.0000 mg | ORAL_TABLET | Freq: Two times a day (BID) | ORAL | 0 refills | Status: AC
  Filled 2021-03-01: qty 14, 7d supply, fill #0

## 2021-03-01 MED ORDER — HYDROMORPHONE HCL 1 MG/ML IJ SOLN
0.5000 mg | Freq: Once | INTRAMUSCULAR | Status: AC
  Administered 2021-03-01: 0.5 mg via INTRAVENOUS
  Filled 2021-03-01: qty 1

## 2021-03-01 MED ORDER — FLUCONAZOLE 150 MG PO TABS
150.0000 mg | ORAL_TABLET | Freq: Once | ORAL | Status: AC
  Administered 2021-03-01: 150 mg via ORAL
  Filled 2021-03-01: qty 1

## 2021-03-01 MED ORDER — DOXYCYCLINE HYCLATE 100 MG PO CAPS
100.0000 mg | ORAL_CAPSULE | Freq: Two times a day (BID) | ORAL | 0 refills | Status: DC
  Filled 2021-03-01: qty 20, 10d supply, fill #0

## 2021-03-01 MED ORDER — SODIUM CHLORIDE 0.9 % IV SOLN
1.0000 g | Freq: Once | INTRAVENOUS | Status: AC
  Administered 2021-03-01: 1 g via INTRAVENOUS
  Filled 2021-03-01: qty 10

## 2021-03-01 NOTE — Discharge Instructions (Addendum)
Please follow-up with your PCP for a repeat evaluation in 3-5 days to ensure resolution of symptoms. We will treat for a sexually transmitted infection/pelvic inflammatory disease as well as a urinary tract infection.

## 2021-03-01 NOTE — ED Provider Notes (Signed)
[facility] Provider Note: Carol Paul [name], MD, FACEP  CSN: [id number] MRN: [id number] ARRIVAL: 03/01/21 at 0624 ROOM: DB011/DB011   CHIEF COMPLAINT  Pelvic Pain   HISTORY OF PRESENT ILLNESS  03/01/21 6:41 AM Carol Paul is a 25 y.o. female who has had about 3 weeks of pelvic pain.  The pelvic pain is all the way across her lower abdomen but worse on the left than the right.  It acutely worsened overnight.  She describes it as sharp and rates it as an 8 out of 10.  It is worse with movement or palpation.  She has also had dysuria with this and has been treated recently for urinary tract infection.  She has had a vaginal discharge but no current vaginal bleeding.  Yesterday evening she had a fever of 101 and has had nausea and vomiting overnight.  She has not had diarrhea with this.  She admits to multiple sexual partners recently without barrier protection.   Past Medical History:  Diagnosis Date   Asthma     Past Surgical History:  Procedure Laterality Date   TONSILLECTOMY      Family History  Problem Relation Age of Onset   CAD Other    Diabetes Mother     Social History   Tobacco Use   Smoking status: Former   Smokeless tobacco: Never  Building services engineerVaping Use   Vaping Use: Never used  Substance Use Topics   Alcohol use: No    Comment: denies   Drug use: Yes    Types: Marijuana    Prior to Admission medications   Medication Sig Start Date End Date Taking? Authorizing Provider  ciprofloxacin (CIPRO) 500 MG tablet Take 1 tablet (500 mg total) by mouth every 12 (twelve) hours for 7 days. 03/01/21 03/08/21 Yes Ernie AvenaLawsing, James, MD  doxycycline (VIBRAMYCIN) 100 MG capsule Take 1 capsule (100 mg total) by mouth 2 (two) times daily. 03/01/21  Yes Ernie AvenaLawsing, James, MD  albuterol (VENTOLIN HFA) 108 (90 Base) MCG/ACT inhaler Inhale 1-2 puffs into the lungs every 6 (six) hours as needed for wheezing or shortness of breath. 04/07/20   Henderly, Britni A, PA-C  ALPRAZolam (XANAX)  1 MG tablet Take 1 mg by mouth daily as needed for anxiety.     [provider]  amphetamine-dextroamphetamine (ADDERALL) 20 MG tablet Take 20 mg by mouth See admin instructions. 2-3 times daily.    [provider]  famotidine (PEPCID) 20 MG tablet Take 1 tablet (20 mg total) by mouth 2 (two) times daily. Patient taking differently: Take 20 mg by mouth 2 (two) times daily as needed for heartburn.  11/08/16   Lenox PondsSilva Zapata, Edwin, MD  ibuprofen (ADVIL) 200 MG tablet Take 200 mg by mouth every 6 (six) hours as needed for mild pain.    [provider]  loratadine (CLARITIN) 10 MG tablet Take 1 tablet by mouth daily as needed.    [provider]  methylphenidate (RITALIN) 20 MG tablet Take 20 mg by mouth 3 (three) times daily. 12/15/20   [provider]  predniSONE (DELTASONE) 50 MG tablet Take 1 tablet (50 mg total) by mouth daily. 04/07/20   Henderly, Britni A, PA-C  promethazine (PHENERGAN) 25 MG suppository Place 1 suppository (25 mg total) rectally every 8 (eight) hours as needed for up to 12 doses for nausea or vomiting. 01/10/21   Terald Sleeperrifan, Matthew J, MD    Allergies Other   REVIEW OF SYSTEMS  Negative except as noted here  or in the History of Present Illness.   PHYSICAL EXAMINATION  Initial Vital Signs Blood pressure (!) 130/99, pulse (!) 123, temperature 98.2 F (36.8 C), temperature source Oral, resp. rate 20, height 5\' 2"  (1.575 m), weight 54.4 kg, SpO2 100 %.  Examination General: Well-developed, well-nourished female in no acute distress; appearance consistent with age of record HENT: normocephalic; atraumatic Eyes: Normal appearance Neck: supple Heart: regular rate and rhythm; tachycardia Lungs: clear to auscultation bilaterally Abdomen: soft; nondistended; left suprapubic tenderness bowel sounds present GU: Vulvovaginal inflammation with curd-like white vaginal discharge; mild cervical motion and right adnexal tenderness, moderate to  severe left adnexal tenderness; left CVA tenderness Extremities: No deformity; full range of motion; pulses normal Neurologic: Awake, alert and oriented; motor function intact in all extremities and symmetric; no facial droop Skin: Warm and dry Psychiatric: Normal mood and affect   RESULTS  Summary of this visit's results, reviewed and interpreted by myself:   EKG Interpretation  Date/Time:    Ventricular Rate:    PR Interval:    QRS Duration:   QT Interval:    QTC Calculation:   R Axis:     Text Interpretation:         Laboratory Studies: Results for orders placed or performed during the hospital encounter of 03/01/21 (from the past 24 hour(s))  Urinalysis, Routine w reflex microscopic Urine, Clean Catch     Status: Abnormal   Collection Time: 03/01/21  6:37 AM  Result Value Ref Range   Color, Urine YELLOW YELLOW   APPearance HAZY (A) CLEAR   Specific Gravity, Urine 1.018 1.005 - 1.030   pH 6.5 5.0 - 8.0   Glucose, UA NEGATIVE NEGATIVE mg/dL   Hgb urine dipstick NEGATIVE NEGATIVE   Bilirubin Urine NEGATIVE NEGATIVE   Ketones, ur NEGATIVE NEGATIVE mg/dL   Protein, ur TRACE (A) NEGATIVE mg/dL   Nitrite POSITIVE (A) NEGATIVE   Leukocytes,Ua LARGE (A) NEGATIVE   RBC / HPF 11-20 0 - 5 RBC/hpf   WBC, UA >50 (H) 0 - 5 WBC/hpf   Bacteria, UA MANY (A) NONE SEEN   Squamous Epithelial / LPF 0-5 0 - 5   Mucus PRESENT   Pregnancy, urine     Status: None   Collection Time: 03/01/21  6:37 AM  Result Value Ref Range   Preg Test, Ur NEGATIVE NEGATIVE  CBC with Differential/Platelet     Status: None   Collection Time: 03/01/21  6:54 AM  Result Value Ref Range   WBC 9.3 4.0 - 10.5 K/uL   RBC 4.02 3.87 - 5.11 MIL/uL   Hemoglobin 13.1 12.0 - 15.0 g/dL   HCT 03/03/21 80.9 - 98.3 %   MCV 96.8 80.0 - 100.0 fL   MCH 32.6 26.0 - 34.0 pg   MCHC 33.7 30.0 - 36.0 g/dL   RDW 38.2 50.5 - 39.7 %   Platelets 259 150 - 400 K/uL   nRBC 0.0 0.0 - 0.2 %   Neutrophils Relative % 63 %   Neutro  Abs 5.9 1.7 - 7.7 K/uL   Lymphocytes Relative 27 %   Lymphs Abs 2.5 0.7 - 4.0 K/uL   Monocytes Relative 7 %   Monocytes Absolute 0.6 0.1 - 1.0 K/uL   Eosinophils Relative 2 %   Eosinophils Absolute 0.2 0.0 - 0.5 K/uL   Basophils Relative 1 %   Basophils Absolute 0.1 0.0 - 0.1 K/uL   Immature Granulocytes 0 %   Abs Immature Granulocytes 0.02 0.00 - 0.07 K/uL  Basic metabolic panel     Status: Abnormal   Collection Time: 03/01/21  6:54 AM  Result Value Ref Range   Sodium 141 135 - 145 mmol/L   Potassium 3.6 3.5 - 5.1 mmol/L   Chloride 106 98 - 111 mmol/L   CO2 26 22 - 32 mmol/L   Glucose, Bld 108 (H) 70 - 99 mg/dL   BUN 10 6 - 20 mg/dL   Creatinine, Ser 7.85 0.44 - 1.00 mg/dL   Calcium 9.4 8.9 - 88.5 mg/dL   GFR, Estimated >02 >77 mL/min   Anion gap 9 5 - 15  Wet prep, genital     Status: Abnormal   Collection Time: 03/01/21  6:55 AM   Specimen: Cervix; Genital  Result Value Ref Range   Yeast Wet Prep HPF POC NONE SEEN NONE SEEN   Trich, Wet Prep NONE SEEN NONE SEEN   Clue Cells Wet Prep HPF POC NONE SEEN NONE SEEN   WBC, Wet Prep HPF POC MANY (A) NONE SEEN   Sperm NONE SEEN    Imaging Studies: CT ABDOMEN PELVIS W CONTRAST  Result Date: 03/01/2021 CLINICAL DATA:  Abdominal abscess/infectious process suspected. Nausea and vomiting. Pyelonephritis versus PID? EXAM: CT ABDOMEN AND PELVIS WITH CONTRAST TECHNIQUE: Multidetector CT imaging of the abdomen and pelvis was performed using the standard protocol following bolus administration of intravenous contrast. CONTRAST:  44mL OMNIPAQUE IOHEXOL 350 MG/ML SOLN COMPARISON:  Of 09/12/2019 FINDINGS: Lower chest: No acute abnormality. Hepatobiliary: No focal liver abnormality is seen. No gallstones, gallbladder wall thickening, or biliary dilatation. Pancreas: Unremarkable. No pancreatic ductal dilatation or surrounding inflammatory changes. Spleen: Normal in size without focal abnormality. Adrenals/Urinary Tract: Adrenal glands are  unremarkable. Kidneys are normal, without renal calculi, focal lesion, or hydronephrosis. Bladder is unremarkable. Stomach/Bowel: Stomach is within normal limits. Appendix appears normal. No evidence of bowel wall thickening, distention, or inflammatory changes. Vascular/Lymphatic: No significant vascular findings are present. No enlarged abdominal or pelvic lymph nodes. Reproductive: Uterus and bilateral adnexa are unremarkable. Other: No abdominal wall hernia or abnormality. No abdominopelvic ascites. Musculoskeletal: No acute or significant osseous findings. IMPRESSION: No acute abnormality of the abdomen or pelvis. Electronically Signed   By: Acquanetta Belling M.D.   On: 03/01/2021 09:30   US PELVIC COMPLETE W TRANSVAGINAL AND TORSION R/O  Result Date: 03/01/2021 CLINICAL DATA:  Left lower quadrant pain with nausea and vomiting beginning a few days ago. EXAM: TRANSABDOMINAL AND TRANSVAGINAL ULTRASOUND OF PELVIS DOPPLER ULTRASOUND OF OVARIES TECHNIQUE: Both transabdominal and transvaginal ultrasound examinations of the pelvis were performed. Transabdominal technique was performed for global imaging of the pelvis including uterus, ovaries, adnexal regions, and pelvic cul-de-sac. It was necessary to proceed with endovaginal exam following the transabdominal exam to visualize the ovaries and adnexa. Color and duplex Doppler ultrasound was utilized to evaluate blood flow to the ovaries. COMPARISON:  CT abdomen pelvis 09/12/2019 FINDINGS: Uterus Measurements: 5.5 x 2.3 x 3.2 cm = volume: 21 mL. No fibroids or other mass visualized. Endometrium Thickness: 7 mm.  No focal abnormality visualized. Right ovary Measurements: 3.6 x 2.2 x 2.1 cm = volume: 8.4 mL. Normal appearance/no adnexal mass. Left ovary Measurements: 2.1 x 2.7 x 2.0 cm = volume: 6.1 mL. Normal appearance/no adnexal mass. Pulsed Doppler evaluation of both ovaries demonstrates normal low-resistance arterial and venous waveforms. Other findings No abnormal  free fluid. IMPRESSION: No significant sonographic abnormality of the uterus or ovaries. Electronically Signed   By: Acquanetta Belling M.D.   On: 03/01/2021 08:07  ED COURSE and MDM  Nursing notes, initial and subsequent vitals signs, including pulse oximetry, reviewed and interpreted by myself.  Vitals:   03/01/21 0628 03/01/21 0634 03/01/21 0948  BP: (!) 130/99  101/72  Pulse: (!) 123  82  Resp: 20  16  Temp: 98.2 F (36.8 C)  98.5 F (36.9 C)  TempSrc: Oral  Oral  SpO2: 100%  100%  Weight:  54.4 kg   Height:  5\' 2"  (1.575 m)    Medications  sodium chloride 0.9 % bolus 500 mL (has no administration in time range)  ondansetron (ZOFRAN) injection 4 mg (has no administration in time range)  cefTRIAXone (ROCEPHIN) 1 g in sodium chloride 0.9 % 100 mL IVPB (has no administration in time range)   6:59 AM Pelvic ultrasound and laboratory studies pending.  Rocephin ordered for likely early pyelonephritis given urinalysis consistent with a urinary tract infection, fever and left CVA tenderness.  Signed out to Dr. .   PROCEDURES  Procedures   ED DIAGNOSES     ICD-10-CM   1. PID (pelvic inflammatory disease)  N73.9     2. LLQ pain  R10.32 Karene Fry PELVIC COMPLETE W TRANSVAGINAL AND TORSION R/O    US PELVIC COMPLETE W TRANSVAGINAL AND TORSION R/O    3. Dysuria  R30.0     4. Pelvic pain  R10.2     5. Flank pain  R10.9     6. Acute cystitis without hematuria  N30.00          Bradee Common, MD 03/01/21 2238

## 2021-03-01 NOTE — ED Provider Notes (Signed)
Physical Exam  BP (!) 130/99 (BP Location: Right Arm)   Pulse (!) 123   Temp 98.2 F (36.8 C) (Oral)   Resp 20   Ht 5\' 2"  (1.575 m)   Wt 54.4 kg   SpO2 100%   BMI 21.95 kg/m   Physical Exam Vitals and nursing note reviewed.  Constitutional:      General: She is not in acute distress.    Appearance: She is well-developed.  HENT:     Head: Normocephalic and atraumatic.  Eyes:     Conjunctiva/sclera: Conjunctivae normal.  Cardiovascular:     Rate and Rhythm: Normal rate and regular rhythm.     Heart sounds: No murmur heard. Pulmonary:     Effort: Pulmonary effort is normal. No respiratory distress.     Breath sounds: Normal breath sounds.  Abdominal:     Palpations: Abdomen is soft.     Tenderness: There is abdominal tenderness in the left lower quadrant. There is left CVA tenderness. There is no right CVA tenderness or guarding.  Musculoskeletal:     Cervical back: Neck supple.  Skin:    General: Skin is warm and dry.  Neurological:     Mental Status: She is alert.    ED Course/Procedures     Procedures  MDM  25 year old female presents emergency department with nausea and vomiting for the past few weeks.  Initially endorses some dysuria.  Had some adnexal tenderness on physical exam.  Had fevers at home, tachycardic to the 120s here.  Urinalysis positive for urinary tract infection.  Wet prep pending, urine culture pending, urine pregnancy negative.  Differential includes developing pyelonephritis versus PID.  Pelvic ultrasound pending at time of signout.  Left-sided CVA tenderness on exam, Rocephin ordered.  GU exam by prior provider: Vulvovaginal inflammation with curd-like white vaginal discharge; mild cervical motion and right adnexal tenderness, moderate to severe left adnexal tenderness; left CVA tenderness.  On my assumption of care, the patient was stable.  Receiving IV fluid bolus.  Left-sided CVA tenderness noted on exam with left lower quadrant tenderness  to palpation.  Medications  cefTRIAXone (ROCEPHIN) 1 g in sodium chloride 0.9 % 100 mL IVPB (has no administration in time range)  HYDROmorphone (DILAUDID) injection 0.5 mg (has no administration in time range)  sodium chloride 0.9 % bolus 500 mL (500 mLs Intravenous New Bag/Given 03/01/21 0703)  ondansetron (ZOFRAN) injection 4 mg (4 mg Intravenous Given 03/01/21 0702)    The patient's pelvic ultrasound was negative for sonographic abnormality of the uterus or ovaries.  No evidence of ovarian torsion, ovarian cyst, tubo-ovarian abscess.  Her wet prep came back positive for WBCs, otherwise negative.  GC chlamydia collected and pending.  BMP resulted grossly unremarkable. Patient symptoms consistent with PID vs UTI/early pyelonephritis. Prior provider also concerned about clinical vulvovaginal candidiasis clinically. Diflucan provided.  CT Abdomen pelvis obtained to evaluate further given chronicity of the patient's symptoms over the past few months which resulted negative for acute abnormalities.  No evidence of pelvic or abdominal abscess.  No clear evidence for pyelonephritis.  We will attempt conservative management with oral outpatient antibiotics following p.o. challenge. The patient is status post IV Rocephin. Will plan to treat for UTI and PID with Doxycycline and Ciprofloxacin.  The patient currently does not have a PCP.  Advised ED follow-up as needed if symptoms do not resolve.  The patient has antiemetics at home.  Overall stable for discharge.  Ernie Avena, MD 03/01/21 [phone]

## 2021-03-01 NOTE — ED Triage Notes (Signed)
Patient here POV from Home with Pelvic Pain, Nausea, and Vomiting.  Patient states it began a few days PTA but worsened especially in the past 24 Hours. N/V has been present for approximately 1.5 weeks.  Burning/Pain with Urination as well.   Ambulatory, GCS 15. NAD Noted during Triage. No Diarrhea. No CP. No SOB.

## 2021-03-02 LAB — GC/CHLAMYDIA PROBE AMP (~~LOC~~) NOT AT ARMC
Chlamydia: NEGATIVE
Comment: NEGATIVE
Comment: NORMAL
Neisseria Gonorrhea: NEGATIVE

## 2021-03-03 LAB — URINE CULTURE: Culture: >=100000 — AB

## 2021-03-04 ENCOUNTER — Telehealth: Payer: Self-pay | Admitting: Emergency Medicine

## 2021-03-04 NOTE — Telephone Encounter (Signed)
Post ED Visit - Positive Culture Follow-up: Unsuccessful Patient Follow-up  Culture assessed and recommendations reviewed by:  []  , Pharm.D. []  Enzo Bi, Pharm.D., BCPS AQ-ID []  , Pharm.D., BCPS []  Celedonio Miyamoto, Pharm.D., BCPS []  [place], Garvin Fila.D., BCPS, AAHIVP []  , Pharm.D., BCPS, AAHIVP []  Georgina Pillion, PharmD [x]  , PharmD, BCPS  Positive urine culture  []  Patient discharged without antimicrobial prescription and treatment is now indicated [x]  Organism is resistant to prescribed ED discharge antimicrobial []  Patient with positive blood cultures   Unable to contact patient at phone number on file, letter will be sent to address on file  Plan: Keflex 500 mg BID for seven days, [place], MD  [place] 03/04/2021, 4:44 PM

## 2021-03-04 NOTE — Progress Notes (Signed)
ED Antimicrobial Stewardship Positive Culture Follow Up   Carol Paul is an 25 y.o. female who presented to [facility] on 03/01/2021 with a chief complaint of  Chief Complaint  Patient presents with   Pelvic Pain    Recent Results (from the past 720 hour(s))  Urine Culture     Status: Abnormal   Collection Time: 03/01/21  6:37 AM   Specimen: Urine, Clean Catch  Result Value Ref Range Status   Specimen Description   Final    URINE, CLEAN CATCH Performed at [facility], [address], [place], [place] [address]    Special Requests   Final    NONE Performed at [facility], [address], [place], [place] [address]    Culture >=100,000 COLONIES/mL ESCHERICHIA COLI (A)  Final   Report Status 03/03/2021 FINAL  Final   Organism ID, Bacteria ESCHERICHIA COLI (A)  Final      Susceptibility   Escherichia coli - MIC*    AMPICILLIN >=32 RESISTANT Resistant     CEFAZOLIN <=4 SENSITIVE Sensitive     CEFEPIME <=0.12 SENSITIVE Sensitive     CEFTRIAXONE <=0.25 SENSITIVE Sensitive     CIPROFLOXACIN >=4 RESISTANT Resistant     GENTAMICIN <=1 SENSITIVE Sensitive     IMIPENEM <=0.25 SENSITIVE Sensitive     NITROFURANTOIN <=16 SENSITIVE Sensitive     TRIMETH/SULFA <=20 SENSITIVE Sensitive     AMPICILLIN/SULBACTAM 16 INTERMEDIATE Intermediate     PIP/TAZO <=4 SENSITIVE Sensitive     * >=100,000 COLONIES/mL ESCHERICHIA COLI  Wet prep, genital     Status: Abnormal   Collection Time: 03/01/21  6:55 AM   Specimen: Cervix; Genital  Result Value Ref Range Status   Yeast Wet Prep HPF POC NONE SEEN NONE SEEN Final   Trich, Wet Prep NONE SEEN NONE SEEN Final   Clue Cells Wet Prep HPF POC NONE SEEN NONE SEEN Final   WBC, Wet Prep HPF POC MANY (A) NONE SEEN Final   Sperm NONE SEEN  Final    Comment: Performed at [facility], [address] [place], [place] [address]    [x]  Treated with ciprofloxacin, organism resistant to  prescribed antimicrobial []  Patient discharged originally without antimicrobial agent and treatment is now indicated  New antibiotic prescription: Keflex 500mg  BID x 7 days (qty 14; refills 0)  ED Provider: , PharmD, BCPS 03/04/2021 12:51 PM ED Clinical Pharmacist -  [phone]

## 2021-05-16 ENCOUNTER — Other Ambulatory Visit: Payer: Self-pay

## 2021-05-16 ENCOUNTER — Encounter (HOSPITAL_BASED_OUTPATIENT_CLINIC_OR_DEPARTMENT_OTHER): Payer: Self-pay | Admitting: Obstetrics and Gynecology

## 2021-05-16 ENCOUNTER — Emergency Department (HOSPITAL_BASED_OUTPATIENT_CLINIC_OR_DEPARTMENT_OTHER): Payer: Self-pay | Admitting: Radiology

## 2021-05-16 DIAGNOSIS — S139XXA Sprain of joints and ligaments of unspecified parts of neck, initial encounter: Secondary | ICD-10-CM | POA: Insufficient documentation

## 2021-05-16 DIAGNOSIS — S39012A Strain of muscle, fascia and tendon of lower back, initial encounter: Secondary | ICD-10-CM | POA: Insufficient documentation

## 2021-05-16 DIAGNOSIS — Z87891 Personal history of nicotine dependence: Secondary | ICD-10-CM | POA: Insufficient documentation

## 2021-05-16 DIAGNOSIS — Y92410 Unspecified street and highway as the place of occurrence of the external cause: Secondary | ICD-10-CM | POA: Insufficient documentation

## 2021-05-16 DIAGNOSIS — S0990XA Unspecified injury of head, initial encounter: Secondary | ICD-10-CM | POA: Insufficient documentation

## 2021-05-16 DIAGNOSIS — J45909 Unspecified asthma, uncomplicated: Secondary | ICD-10-CM | POA: Insufficient documentation

## 2021-05-16 DIAGNOSIS — Z7722 Contact with and (suspected) exposure to environmental tobacco smoke (acute) (chronic): Secondary | ICD-10-CM | POA: Insufficient documentation

## 2021-05-16 LAB — URINALYSIS, ROUTINE W REFLEX MICROSCOPIC
Bilirubin Urine: NEGATIVE
Glucose, UA: NEGATIVE mg/dL
Hgb urine dipstick: NEGATIVE
Ketones, ur: NEGATIVE mg/dL
Nitrite: NEGATIVE
Specific Gravity, Urine: 1.032 — ABNORMAL HIGH (ref 1.005–1.030)
pH: 6.5 (ref 5.0–8.0)

## 2021-05-16 LAB — PREGNANCY, URINE: Preg Test, Ur: NEGATIVE
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
## 2021-05-17 ENCOUNTER — Emergency Department (HOSPITAL_BASED_OUTPATIENT_CLINIC_OR_DEPARTMENT_OTHER)
Admission: EM | Admit: 2021-05-17 | Discharge: 2021-05-17 | Disposition: A | Discharge status: Home / Self Care | Payer: Self-pay | Attending: Emergency Medicine | Admitting: Emergency Medicine

## 2021-05-17 ENCOUNTER — Emergency Department (HOSPITAL_BASED_OUTPATIENT_CLINIC_OR_DEPARTMENT_OTHER): Payer: Self-pay | Admitting: Radiology

## 2021-05-17 ENCOUNTER — Emergency Department (HOSPITAL_BASED_OUTPATIENT_CLINIC_OR_DEPARTMENT_OTHER): Payer: Self-pay

## 2021-05-17 DIAGNOSIS — R202 Paresthesia of skin: Secondary | ICD-10-CM

## 2021-05-17 DIAGNOSIS — S39012A Strain of muscle, fascia and tendon of lower back, initial encounter: Secondary | ICD-10-CM

## 2021-05-17 DIAGNOSIS — S139XXA Sprain of joints and ligaments of unspecified parts of neck, initial encounter: Secondary | ICD-10-CM

## 2021-05-17 MED ORDER — CYCLOBENZAPRINE HCL 10 MG PO TABS
10.0000 mg | ORAL_TABLET | Freq: Once | ORAL | Status: AC
  Administered 2021-05-17: 10 mg via ORAL
  Filled 2021-05-17: qty 1

## 2021-05-17 MED ORDER — TRAMADOL HCL 50 MG PO TABS
50.0000 mg | ORAL_TABLET | Freq: Once | ORAL | Status: AC
  Administered 2021-05-17: 50 mg via ORAL
  Filled 2021-05-17: qty 1

## 2021-05-17 MED ORDER — ACETAMINOPHEN 325 MG PO TABS: 650.0000 mg | ORAL_TABLET | Freq: Once | ORAL | Status: DC

## 2021-05-17 MED ORDER — HYDROCODONE-ACETAMINOPHEN 5-325 MG PO TABS
1.0000 | ORAL_TABLET | Freq: Once | ORAL | Status: AC
  Administered 2021-05-17: 1 via ORAL
  Filled 2021-05-17: qty 1

## 2021-05-17 MED ORDER — IBUPROFEN 400 MG PO TABS: 400.0000 mg | ORAL_TABLET | Freq: Once | ORAL | Status: DC

## 2021-05-17 MED ORDER — CYCLOBENZAPRINE HCL 10 MG PO TABS: 10.0000 mg | ORAL_TABLET | Freq: Three times a day (TID) | ORAL | 0 refills | Status: DC | PRN

## 2021-05-17 MED ORDER — HYDROCODONE-ACETAMINOPHEN 5-325 MG PO TABS: 1.0000 | ORAL_TABLET | ORAL | 0 refills | Status: DC | PRN

## 2021-05-17 NOTE — ED Notes (Signed)
Patient discharged home with caregiver.  All discharge instructions reviewed.  Patient verbalized understanding via teachback method.  VS WDL.  Ambulatory out of ED.

## 2021-05-17 NOTE — Discharge Instructions (Signed)
Your CT scans and x-rays did not show evidence of serious injury.  Please apply ice to all areas that are painful.  Ice can be applied for 30 minutes at a time, 4 times a day.  Continue taking ibuprofen as needed for pain.  You may add acetaminophen to get additional pain relief.  Reserve hydrocodone-acetaminophen for severe pain not relieved by ibuprofen.  You may take the muscle relaxer as needed.  Follow-up with the sports medicine physician if you are having any problems.
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
## 2021-05-28 ENCOUNTER — Emergency Department (HOSPITAL_BASED_OUTPATIENT_CLINIC_OR_DEPARTMENT_OTHER)
Admission: EM | Admit: 2021-05-28 | Discharge: 2021-05-28 | Disposition: A | Discharge status: Home / Self Care | Payer: Self-pay | Attending: Emergency Medicine | Admitting: Emergency Medicine

## 2021-05-28 ENCOUNTER — Emergency Department (HOSPITAL_BASED_OUTPATIENT_CLINIC_OR_DEPARTMENT_OTHER): Payer: Self-pay | Admitting: Radiology

## 2021-05-28 ENCOUNTER — Other Ambulatory Visit: Payer: Self-pay

## 2021-05-28 ENCOUNTER — Encounter (HOSPITAL_BASED_OUTPATIENT_CLINIC_OR_DEPARTMENT_OTHER): Payer: Self-pay | Admitting: Emergency Medicine

## 2021-05-28 DIAGNOSIS — Z87891 Personal history of nicotine dependence: Secondary | ICD-10-CM | POA: Insufficient documentation

## 2021-05-28 DIAGNOSIS — J45909 Unspecified asthma, uncomplicated: Secondary | ICD-10-CM | POA: Insufficient documentation

## 2021-05-28 DIAGNOSIS — J209 Acute bronchitis, unspecified: Secondary | ICD-10-CM | POA: Insufficient documentation

## 2021-05-28 DIAGNOSIS — J40 Bronchitis, not specified as acute or chronic: Secondary | ICD-10-CM

## 2021-05-28 MED ORDER — DEXAMETHASONE SODIUM PHOSPHATE 10 MG/ML IJ SOLN
10.0000 mg | Freq: Once | INTRAMUSCULAR | Status: AC
  Administered 2021-05-28: 10 mg via INTRAMUSCULAR
  Filled 2021-05-28: qty 1

## 2021-05-28 MED ORDER — METHYLPREDNISOLONE 4 MG PO TBPK: ORAL_TABLET | ORAL | 0 refills | Status: DC

## 2021-05-28 NOTE — ED Provider Notes (Signed)
[facility] EMERGENCY DEPT Provider Note   CSN: [id number] Arrival date & time: 05/28/21  1613     History Chief Complaint  Patient presents with   Shortness of Breath    Carol Paul is a 25 y.o. female with history significant for asthma for shortness of breath in the setting of influenza diagnosis 1-1/2 weeks ago.  Patient reports previous flu symptoms have mostly resolved including fever, body aches, abdominal pain, nausea, vomiting and diarrhea.  However she has had increasing nonproductive, wet cough, and shortness of breath upon exertion.  She feels like she needs to stop and catch her breath when she walks around her house.  She complains of chest tightness.  She is taking her albuterol inhaler without improvement.She is also been taking over-the-counter cough medication without improvement.  She denies headaches, weakness, fatigue and all her symptoms.   Shortness of Breath Associated symptoms: cough   Associated symptoms: no abdominal pain, no fever, no headaches, no rash and no vomiting       Past Medical History:  Diagnosis Date   Asthma     Patient Active Problem List   Diagnosis Date Noted   MDD (major depressive disorder) 11/26/2016   Nausea vomiting and diarrhea 11/07/2016   Abdominal pain 11/07/2016   Lactic acid increased 11/07/2016   Leukocytosis 11/07/2016   SIRS (systemic inflammatory response syndrome) (HCC) 11/07/2016    Past Surgical History:  Procedure Laterality Date   TONSILLECTOMY       OB History     Gravida  0   Para  0   Term  0   Preterm  0   AB  0   Living  0      SAB  0   IAB  0   Ectopic  0   Multiple  0   Live Births  0           Family History  Problem Relation Age of Onset   CAD Other    Diabetes Mother     Social History   Tobacco Use   Smoking status: Former    Passive exposure: Current   Smokeless tobacco: Never  Vaping Use   Vaping Use: Every day   Substances: Nicotine,  THC, Flavoring  Substance Use Topics   Alcohol use: Yes    Comment: social   Drug use: Yes    Types: Marijuana    Home Medications Prior to Admission medications   Medication Sig Start Date End Date Taking? Authorizing Provider  methylPREDNISolone (MEDROL DOSEPAK) 4 MG TBPK tablet Use as directed 05/28/21  Yes Raynald Blend R, PA-C  albuterol (VENTOLIN HFA) 108 (90 Base) MCG/ACT inhaler Inhale 1-2 puffs into the lungs every 6 (six) hours as needed for wheezing or shortness of breath. 04/07/20   Henderly, Britni A, PA-C  ALPRAZolam (XANAX) 1 MG tablet Take 1 mg by mouth daily as needed for anxiety.     [provider]  amphetamine-dextroamphetamine (ADDERALL) 20 MG tablet Take 20 mg by mouth See admin instructions. 2-3 times daily.    [provider]  cyclobenzaprine (FLEXERIL) 10 MG tablet Take 1 tablet (10 mg total) by mouth 3 (three) times daily as needed for muscle spasms. 05/17/21   Dione Booze, MD  famotidine (PEPCID) 20 MG tablet Take 1 tablet (20 mg total) by mouth 2 (two) times daily. Patient taking differently: Take 20 mg by mouth 2 (two) times daily as needed for heartburn.  11/08/16   Randel Pigg,  Christean Grief, MD  HYDROcodone-acetaminophen (NORCO) 5-325 MG tablet Take 1 tablet by mouth every 4 (four) hours as needed for moderate pain. [id number]   Delora Fuel, MD  ibuprofen (ADVIL) 200 MG tablet Take 200 mg by mouth every 6 (six) hours as needed for mild pain.    [provider]  loratadine (CLARITIN) 10 MG tablet Take 1 tablet by mouth daily as needed.    [provider]  methylphenidate (RITALIN) 20 MG tablet Take 20 mg by mouth 3 (three) times daily. 12/15/20   [provider]  promethazine (PHENERGAN) 25 MG suppository Place 1 suppository (25 mg total) rectally every 8 (eight) hours as needed for up to 12 doses for nausea or vomiting. 01/10/21   Wyvonnia Dusky, MD    Allergies    Other  Review of Systems   Review of Systems   Constitutional:  Negative for fever.  HENT: Negative.    Eyes: Negative.   Respiratory:  Positive for cough, chest tightness and shortness of breath.   Cardiovascular: Negative.   Gastrointestinal:  Negative for abdominal pain and vomiting.  Endocrine: Negative.   Genitourinary: Negative.   Musculoskeletal: Negative.   Skin:  Negative for rash.  Neurological:  Negative for headaches.  All other systems reviewed and are negative.  Physical Exam Updated Vital Signs BP 100/71   Pulse 100   Temp 97.7 F (36.5 C)   Resp 18   Ht 5\' 2"  (1.575 m)   Wt 56.7 kg   LMP 05/02/2021 (Approximate) Comment: neg preg test  SpO2 99%   BMI 22.86 kg/m   Physical Exam Vitals and nursing note reviewed.  Constitutional:      General: She is not in acute distress.    Appearance: She is not ill-appearing.  HENT:     Head: Atraumatic.  Eyes:     Conjunctiva/sclera: Conjunctivae normal.  Cardiovascular:     Rate and Rhythm: Normal rate and regular rhythm.     Pulses: Normal pulses.     Heart sounds: No murmur heard. Pulmonary:     Effort: Pulmonary effort is normal. No respiratory distress.     Breath sounds: Normal breath sounds.     Comments: Lungs clear to auscultation bilaterally.  No accessory muscle usage. Abdominal:     General: Abdomen is flat. There is no distension.     Palpations: Abdomen is soft.     Tenderness: There is no abdominal tenderness.  Musculoskeletal:        General: Normal range of motion.     Cervical back: Normal range of motion.  Skin:    General: Skin is warm and dry.     Capillary Refill: Capillary refill takes less than 2 seconds.  Neurological:     General: No focal deficit present.     Mental Status: She is alert.  Psychiatric:        Mood and Affect: Mood normal.    ED Results / Procedures / Treatments   Labs (all labs ordered are listed, but only abnormal results are displayed) Labs Reviewed - No data to display  EKG None  Radiology DG  Chest 2 View  Result Date: 05/28/2021 CLINICAL DATA:  Shortness of breath EXAM: CHEST - 2 VIEW COMPARISON:  04/07/2020 FINDINGS: Mild bronchitic changes. No focal airspace disease, pleural effusion, or pneumothorax. Normal cardiomediastinal silhouette. Bilateral nipple rings. IMPRESSION: Central airways thickening likely corresponds to history of asthma. There is no acute focal superimposed airspace disease. Electronically Signed   By:  Donavan Foil M.D.   On: 05/28/2021 19:25    Procedures Procedures   Medications Ordered in ED Medications  dexamethasone (DECADRON) injection 10 mg (10 mg Intramuscular Given 05/28/21 1934)    ED Course  I have reviewed the triage vital signs and the nursing notes.  Pertinent labs & imaging results that were available during my care of the patient were reviewed by me and considered in my medical decision making (see chart for details).    MDM Rules/Calculators/A&P                         This is a 25 year old female with asthma who presents for shortness of breath and worsening cough in the setting of a previous influenza diagnosis.  Patient's vitals were normal here in the ED.  Oxygen saturations at 100% on room air while lying down.  I did have patient get up and walk around with the SPO2 sensor to assess her oxygen saturations during exertion.  Oxygen levels remained at around 99 to 100%.  Chest x-ray negative for pneumonia or infiltrate.  Symptoms not consistent with PE, myocarditis, pericarditis.  Additionally, not consistent with developing pneumonia.  Given patient's overall reassuring work-up and physical exam, her symptoms are likely due to to an acute bronchitis.  As patient has previous history of asthma which may be a factor in her lingering cough, administered 10 mg Decadron IM in the ED with a Medrol pack to go home.  Patient is amenable to plan.  Discharged home in good condition. Final Clinical Impression(s) / ED Diagnoses Final diagnoses:   Bronchitis    Rx / DC Orders ED Discharge Orders          Ordered    methylPREDNISolone (MEDROL DOSEPAK) 4 MG TBPK tablet        05/28/21 1954             Tonye Pearson, [place] 05/28/21 2301    Sherwood Gambler, MD 05/28/21 2349

## 2021-05-28 NOTE — ED Notes (Signed)
Pt verbalizes understanding of discharge instructions. Opportunity for questioning and answers were provided. Pt discharged from ED to home. Pt left with friend. Instructed on where to pick up medications.

## 2021-05-28 NOTE — Discharge Instructions (Addendum)
I have sent you in a prescription for a medrol dose pack which you will use as directed. This should help with all the inflammation you are experiencing and get you feeling better soon! Please return if your shortness of breath worsens or you have difficulty breathing.

## 2021-05-28 NOTE — ED Triage Notes (Signed)
Pt arrives to ED with c/o of shortness of breath. This started x2 days ago. She reports her SOB has worsened. She was tested positive for the Flu on 11/17 and has felt fatigued/weak since. She reports she has a productive cough that has been constant. She endorses DOE and orthopnea. She denies dyspnea at rest. She has hx of Asthma, she has used her Albuterol 25 times over last two days.

## 2022-06-27 ENCOUNTER — Encounter (HOSPITAL_BASED_OUTPATIENT_CLINIC_OR_DEPARTMENT_OTHER): Payer: Self-pay

## 2022-06-27 ENCOUNTER — Other Ambulatory Visit: Payer: Self-pay

## 2022-06-27 ENCOUNTER — Emergency Department (HOSPITAL_BASED_OUTPATIENT_CLINIC_OR_DEPARTMENT_OTHER)
Admission: EM | Admit: 2022-06-27 | Discharge: 2022-06-28 | Disposition: A | Discharge status: Home / Self Care | Payer: Medicaid Other | Attending: Emergency Medicine | Admitting: Emergency Medicine

## 2022-06-27 DIAGNOSIS — J45909 Unspecified asthma, uncomplicated: Secondary | ICD-10-CM | POA: Insufficient documentation

## 2022-06-27 DIAGNOSIS — O219 Vomiting of pregnancy, unspecified: Secondary | ICD-10-CM | POA: Diagnosis present

## 2022-06-27 DIAGNOSIS — O210 Mild hyperemesis gravidarum: Secondary | ICD-10-CM

## 2022-06-27 DIAGNOSIS — O200 Threatened abortion: Secondary | ICD-10-CM | POA: Diagnosis not present

## 2022-06-27 DIAGNOSIS — Z7951 Long term (current) use of inhaled steroids: Secondary | ICD-10-CM | POA: Insufficient documentation

## 2022-06-27 LAB — URINALYSIS, ROUTINE W REFLEX MICROSCOPIC
Bilirubin Urine: NEGATIVE
Glucose, UA: NEGATIVE mg/dL
Hgb urine dipstick: NEGATIVE
Ketones, ur: NEGATIVE mg/dL
Leukocytes,Ua: NEGATIVE
Nitrite: NEGATIVE
Protein, ur: NEGATIVE mg/dL
Specific Gravity, Urine: 1.012 (ref 1.005–1.030)
pH: 6.5 (ref 5.0–8.0)

## 2022-06-27 LAB — COMPREHENSIVE METABOLIC PANEL
ALT: 18 U/L (ref 0–44)
AST: 16 U/L (ref 15–41)
Albumin: 3.8 g/dL (ref 3.5–5.0)
Alkaline Phosphatase: 41 U/L (ref 38–126)
Anion gap: 13 (ref 5–15)
BUN: 7 mg/dL (ref 6–20)
CO2: 19 mmol/L — ABNORMAL LOW (ref 22–32)
Calcium: 9.1 mg/dL (ref 8.9–10.3)
Chloride: 103 mmol/L (ref 98–111)
Creatinine, Ser: 0.37 mg/dL — ABNORMAL LOW (ref 0.44–1.00)
GFR, Estimated: >60 mL/min (ref >60)
Glucose, Bld: 107 mg/dL — ABNORMAL HIGH (ref 70–99)
Potassium: 3.5 mmol/L (ref 3.5–5.1)
Sodium: 135 mmol/L (ref 135–145)
Total Bilirubin: 0.2 mg/dL — ABNORMAL LOW (ref 0.3–1.2)
Total Protein: 6.6 g/dL (ref 6.5–8.1)

## 2022-06-27 LAB — HCG, QUANTITATIVE, PREGNANCY: hCG, Beta Chain, Quant, S: >250000 m[IU]/mL — ABNORMAL HIGH (ref <5)

## 2022-06-27 LAB — CBC
HCT: 32.9 % — ABNORMAL LOW (ref 36.0–46.0)
Hemoglobin: 11.2 g/dL — ABNORMAL LOW (ref 12.0–15.0)
MCH: 32.4 pg (ref 26.0–34.0)
MCHC: 34 g/dL (ref 30.0–36.0)
MCV: 95.1 fL (ref 80.0–100.0)
Platelets: 329 10*3/uL (ref 150–400)
RBC: 3.46 MIL/uL — ABNORMAL LOW (ref 3.87–5.11)
RDW: 13.2 % (ref 11.5–15.5)
WBC: 17 10*3/uL — ABNORMAL HIGH (ref 4.0–10.5)
nRBC: 0 % (ref 0.0–0.2)

## 2022-06-27 MED ORDER — SODIUM CHLORIDE 0.9 % IV SOLN
12.5000 mg | Freq: Four times a day (QID) | INTRAVENOUS | Status: DC | PRN
  Administered 2022-06-27: 12.5 mg via INTRAVENOUS
  Filled 2022-06-27: qty 0.5

## 2022-06-27 MED ORDER — LACTATED RINGERS IV BOLUS
1000.0000 mL | Freq: Once | INTRAVENOUS | Status: AC
  Administered 2022-06-27: 1000 mL via INTRAVENOUS

## 2022-06-27 MED ORDER — PROMETHAZINE HCL 25 MG/ML IJ SOLN
INTRAMUSCULAR | Status: AC
  Filled 2022-06-27: qty 1

## 2022-06-27 MED ORDER — ONDANSETRON 4 MG PO TBDP: 4.0000 mg | ORAL_TABLET | Freq: Three times a day (TID) | ORAL | 0 refills | Status: DC | PRN

## 2022-06-27 NOTE — Discharge Instructions (Addendum)
Begin taking Zofran as prescribed as needed for nausea.  Clear liquids for the next 12 hours, then slowly advance to normal as tolerated.  No heavy lifting or strenuous activity until cleared by your OB.

## 2022-06-27 NOTE — ED Triage Notes (Signed)
Patient here POV from Home.  [redacted] Weeks Pregnant with Triplets. First Pregnancy. N/V for 3 Days. Worse today. No Diarrhea or Fevers.  NAD Noted during Triage. A&Ox4. GCS 15. Ambulatory.

## 2022-06-27 NOTE — ED Provider Notes (Signed)
[facility] EMERGENCY DEPT Provider Note   CSN: [id number] Arrival date & time: 06/27/22  2050     History  Chief Complaint  Patient presents with   Emesis During Pregnancy    Carol Paul is a 26 y.o. female.  HPI Patient presents with nausea vomiting.  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  She is about [redacted] weeks pregnant with triplets.  No diarrhea.  Mild epigastric pain.  States she has had some lower abdominal cramping and some slight vaginal bleeding.  States that is new for this pregnancy.  Has had intrauterine pregnancy is confirmed and has triplets.  Has been taking Phenergan at home but has been unable to keep it down.   Past Medical History:  Diagnosis Date   Asthma     Home Medications Prior to Admission medications   Medication Sig Start Date End Date Taking? Authorizing Provider  ondansetron (ZOFRAN-ODT) 4 MG disintegrating tablet Take 1 tablet (4 mg total) by mouth every 8 (eight) hours as needed for nausea or vomiting. 06/27/22  Yes Benjiman Core, MD  albuterol (VENTOLIN HFA) 108 (90 Base) MCG/ACT inhaler Inhale 1-2 puffs into the lungs every 6 (six) hours as needed for wheezing or shortness of breath. 04/07/20   Henderly, Britni A, PA-C  ALPRAZolam (XANAX) 1 MG tablet Take 1 mg by mouth daily as needed for anxiety.     [provider]  amphetamine-dextroamphetamine (ADDERALL) 20 MG tablet Take 20 mg by mouth See admin instructions. 2-3 times daily.    [provider]  cyclobenzaprine (FLEXERIL) 10 MG tablet Take 1 tablet (10 mg total) by mouth 3 (three) times daily as needed for muscle spasms. 05/17/21   Dione Booze, MD  famotidine (PEPCID) 20 MG tablet Take 1 tablet (20 mg total) by mouth 2 (two) times daily. Patient taking differently: Take 20 mg by mouth 2 (two) times daily as needed for heartburn.  11/08/16   Lenox Ponds, MD   HYDROcodone-acetaminophen [facility]) 5-325 MG tablet Take 1 tablet by mouth every 4 (four) hours as needed for moderate pain. 05/17/21   Dione Booze, MD  ibuprofen (ADVIL) 200 MG tablet Take 200 mg by mouth every 6 (six) hours as needed for mild pain.    [provider]  loratadine (CLARITIN) 10 MG tablet Take 1 tablet by mouth daily as needed.    [provider]  methylphenidate (RITALIN) 20 MG tablet Take 20 mg by mouth 3 (three) times daily. 12/15/20   [provider]  methylPREDNISolone (MEDROL DOSEPAK) 4 MG TBPK tablet Use as directed 05/28/21   Janell Quiet, PA-C  promethazine (PHENERGAN) 25 MG suppository Place 1 suppository (25 mg total) rectally every 8 (eight) hours as needed for up to 12 doses for nausea or vomiting. 01/10/21   Terald Sleeper, MD      Allergies    Other    Review of Systems   Review of Systems  Physical Exam Updated Vital Signs BP 120/85   Pulse (!) 116   Temp 97.6 F (36.4 C) (Oral)   Resp 18   Ht 5\' 2"  (1.575 m)   Wt 90.7 kg   SpO2 100%   BMI 36.58 kg/m  Physical Exam Vitals and nursing note reviewed.  Cardiovascular:     Rate and Rhythm: Regular rhythm. Tachycardia present.  Pulmonary:     Breath sounds: No wheezing.  Abdominal:     Tenderness: There  is no abdominal tenderness.  Skin:    General: Skin is warm.     Capillary Refill: Capillary refill takes less than 2 seconds.  Neurological:     Mental Status: She is alert and oriented to person, place, and time.     ED Results / Procedures / Treatments   Labs (all labs ordered are listed, but only abnormal results are displayed) Labs Reviewed  COMPREHENSIVE METABOLIC PANEL - Abnormal; Notable for the following components:      Result Value   CO2 19 (*)    Glucose, Bld 107 (*)    Creatinine, Ser 0.37 (*)    Total Bilirubin 0.2 (*)    All other components within normal limits  CBC - Abnormal; Notable for the following components:   WBC 17.0 (*)    RBC  3.46 (*)    Hemoglobin 11.2 (*)    HCT 32.9 (*)    All other components within normal limits  HCG, QUANTITATIVE, PREGNANCY - Abnormal; Notable for the following components:   hCG, Beta Chain, Quant, S >250,000 (*)    All other components within normal limits  URINALYSIS, ROUTINE W REFLEX MICROSCOPIC    EKG None  Radiology No results found.  Procedures Procedures    Medications Ordered in ED Medications  promethazine (PHENERGAN) 12.5 mg in sodium chloride 0.9 % 50 mL IVPB (0 mg Intravenous Stopped 06/27/22 2220)  promethazine (PHENERGAN) 25 MG/ML injection (  Not Given 06/27/22 2201)  lactated ringers bolus 1,000 mL (1,000 mLs Intravenous New Bag/Given 06/27/22 2306)  lactated ringers bolus 1,000 mL (0 mLs Intravenous Stopped 06/27/22 2306)    ED Course/ Medical Decision Making/ A&P                           Medical Decision Making Amount and/or Complexity of Data Reviewed Labs: ordered.  Risk Prescription drug management.   Patient with triplet pregnancy.  Around 9 weeks.  Found to have nausea and vomiting.  Has had issues of the same with pregnancy.  Likely somewhat dehydrated since she is at her 57.  Has been on Phenergan at home.  Will check basic blood work and give fluid.  Also has had some mild vaginal spotting.  Has had previous confirmed intrauterine pregnancy.  Will check hCG but do not think we necessarily need ultrasound at this time.  Informed patient chance of threatened miscarriage.  Lab work overall reassuring.  Feeling somewhat better after some fluids.  Patient is requesting Zofran.  I think it is reasonable with the current persistent vomiting.  Hopefully should be able to discharge home.  Will give oral trial.  Care turned over to Dr.DeLo        Final Clinical Impression(s) / ED Diagnoses Final diagnoses:  Hyperemesis gravidarum  Threatened miscarriage    Rx / DC Orders ED Discharge Orders          Ordered    ondansetron (ZOFRAN-ODT) 4 MG  disintegrating tablet  Every 8 hours PRN        06/27/22 2339              Benjiman Core, MD 06/27/22 2340

## 2022-06-28 MED ORDER — ONDANSETRON 8 MG PO TBDP: ORAL_TABLET | ORAL | 0 refills | Status: DC

## 2022-06-28 NOTE — ED Notes (Signed)
Tolerates soda and crackers.

## 2022-06-28 NOTE — ED Provider Notes (Signed)
  Physical Exam  BP (!) 102/50   Pulse (!) 105   Temp 97.6 F (36.4 C) (Oral)   Resp 18   Ht 5\' 2"  (1.575 m)   Wt 90.7 kg   SpO2 99%   BMI 36.58 kg/m   Physical Exam Vitals and nursing note reviewed.  Constitutional:      Appearance: Normal appearance.  Pulmonary:     Effort: Pulmonary effort is normal.  Skin:    General: Skin is warm and dry.  Neurological:     Mental Status: She is alert and oriented to person, place, and time.     Procedures  Procedures  ED Course / MDM  Care assumed from Dr. at shift change.  Patient is G1 with triplets presenting with complaints of nausea and vomiting.  Care signed out to me awaiting response to medications and fluids.  Patient is now tolerating p.o. without difficulty.  She is resting comfortably and feels much better.  At this point, I feel as though she can safely be discharged.  Patient feels comfortable with the disposition and will return if symptoms worsen.       Rubin Payor, MD 06/28/22 [phone]

## 2022-06-28 NOTE — ED Notes (Signed)
Pt verbalized understanding of d/c instructions, meds, and followup care. Denies questions. VSS, no distress noted. Steady gait to exit with all belongings.  ?

## 2022-07-08 NOTE — L&D Delivery Note (Addendum)
Carol Paul is a 27 y.o. female G1P0000 with IUP at 86w6dadmitted for previable preterm labor.  She progressed with cytotec augmentation to complete. Cord clamping delayed by several minutes then clamped by CNM and cut by patient's partner.  1235: CNM called to room due to delivery of baby A. First baby delivered en caul, FHR noted at delivery. Cord clamped and cut by patient's partner. Inspection of placenta showed two intact placentas connected with one small septation between the two placentas.   Bleeding minimal after first delivery. Cervical exam showed BBOW with fetal parts at introitus. Patient requested a few minutes before attempting to delivery baby B.      At 1248, patient pushed one time to deliver baby B en caul with placenta attached. Bag of water opened and no signs of life noted with baby B. Cord clamped with 2 clamps and cut by patient's partner. Inspection of placenta showed one intact placenta.  Small trickle of bright red bleeding noted after delivery of baby B. Fundus firm. Pitocin started and methergine ordered. Bolus of LR started.  1302: Dr. WSi Raidernotified of BP of 82/31 and requested presence at bedside. Epidural paused and phenylephrine given. MD performed fundal rub and bleeding still minimal. MD recommends TXA.   1310: Patient remains hypotensive. Dr. AHarolyn Rutherfordcalled to bedside. Dr. HValma Cavacalled to bedside. Anesthesia recommends giving ephedrine and continuing with fluids and phenylephrine. Dr. AHarolyn Rutherfordrecommends transfusing 2 units PRBCs. Second IV started. MD remained at bedside until no longer hypotensive. CBC and DIC panel drawn  1331: Patient now febrile at 102.7. Consulted with Dr. AHarolyn Rutherfordwho recommends Zosyn q8 hours for 3 doses  Anesthesia: Epidural Blood Loss: 24061mtotal      Upon admission: 130060m    While in labor: 800m55m   At delivery: 200ml35mlaceration noted  Both fetuses at bedside with patient and family. Discussed need for patient to  remain inpatient at this time. Patient verbalized understanding.   Carol Paul 08/30/22 2:24 PM

## 2022-07-12 DIAGNOSIS — F509 Eating disorder, unspecified: Secondary | ICD-10-CM | POA: Insufficient documentation

## 2022-08-03 ENCOUNTER — Other Ambulatory Visit: Payer: Self-pay

## 2022-08-03 ENCOUNTER — Emergency Department (HOSPITAL_BASED_OUTPATIENT_CLINIC_OR_DEPARTMENT_OTHER)
Admission: EM | Admit: 2022-08-03 | Discharge: 2022-08-03 | Disposition: A | Discharge status: Other Acute Inpatient Hospital | Payer: Medicaid Other | Attending: Emergency Medicine | Admitting: Emergency Medicine

## 2022-08-03 ENCOUNTER — Encounter (HOSPITAL_BASED_OUTPATIENT_CLINIC_OR_DEPARTMENT_OTHER): Payer: Self-pay | Admitting: Emergency Medicine

## 2022-08-03 DIAGNOSIS — O4290 Premature rupture of membranes, unspecified as to length of time between rupture and onset of labor, unspecified weeks of gestation: Secondary | ICD-10-CM

## 2022-08-03 DIAGNOSIS — O4192X0 Disorder of amniotic fluid and membranes, unspecified, second trimester, not applicable or unspecified: Secondary | ICD-10-CM | POA: Diagnosis present

## 2022-08-03 DIAGNOSIS — O30102 Triplet pregnancy, unspecified number of placenta and unspecified number of amniotic sacs, second trimester: Secondary | ICD-10-CM | POA: Diagnosis not present

## 2022-08-03 DIAGNOSIS — Z3A14 14 weeks gestation of pregnancy: Secondary | ICD-10-CM | POA: Insufficient documentation

## 2022-08-03 NOTE — ED Notes (Signed)
Spoke with Loma Sousa, RN with [facility] and advised her of Ms Petrasek.  She advised to triage patient as normal.

## 2022-08-03 NOTE — ED Notes (Signed)
OB Charge Nurse Lattie Haw @[facility]  verbalized complete understanding of Pt plan of care and current condition, denies questions at this time.

## 2022-08-03 NOTE — ED Provider Notes (Signed)
[facility] Provider Note   CSN: [id number] Arrival date & time: 08/03/22  0235     History  Chief Complaint  Patient presents with   pregnancy complication    Carol Paul is a 27 y.o. female.  Patient is a 27 year old female G1 at [redacted] weeks gestation with triplets.  Patient had an elective reduction of one of the triplets earlier today performed at [facility].  This evening she noted some abdominal cramping, then had a rush of fluid causing what she describes as a small puddle on the floor.  She apparently called the high risk OB department at [facility] and was instructed to come to the nearest ED.  Patient denies to me she is having any bleeding, any fevers, any chills, or other complaints.  She does describe some lower abdominal cramping.  The history is provided by the patient.       Home Medications Prior to Admission medications   Medication Sig Start Date End Date Taking? Authorizing Provider  albuterol (VENTOLIN HFA) 108 (90 Base) MCG/ACT inhaler Inhale 1-2 puffs into the lungs every 6 (six) hours as needed for wheezing or shortness of breath. 04/07/20   Henderly, Britni A, PA-C  ALPRAZolam (XANAX) 1 MG tablet Take 1 mg by mouth daily as needed for anxiety.     [provider]  amphetamine-dextroamphetamine (ADDERALL) 20 MG tablet Take 20 mg by mouth See admin instructions. 2-3 times daily.    [provider]  cyclobenzaprine (FLEXERIL) 10 MG tablet Take 1 tablet (10 mg total) by mouth 3 (three) times daily as needed for muscle spasms. 67/89/38   Delora Fuel, MD  famotidine (PEPCID) 20 MG tablet Take 1 tablet (20 mg total) by mouth 2 (two) times daily. Patient taking differently: Take 20 mg by mouth 2 (two) times daily as needed for heartburn.  11/08/16   Doreatha Lew, MD  HYDROcodone-acetaminophen [facility]) 5-325 MG tablet Take 1 tablet by mouth every 4 (four) hours as needed for moderate pain. 04/23/50   Delora Fuel, MD  ibuprofen (ADVIL) 200 MG tablet Take 200 mg by mouth every 6 (six) hours as needed for mild pain.    [provider]  loratadine (CLARITIN) 10 MG tablet Take 1 tablet by mouth daily as needed.    [provider]  methylphenidate (RITALIN) 20 MG tablet Take 20 mg by mouth 3 (three) times daily. 12/15/20   [provider]  methylPREDNISolone (MEDROL DOSEPAK) 4 MG TBPK tablet Use as directed 05/28/21   Kathe Becton R, PA-C  ondansetron (ZOFRAN-ODT) 4 MG disintegrating tablet Take 1 tablet (4 mg total) by mouth every 8 (eight) hours as needed for nausea or vomiting. 06/27/22   Davonna Belling, MD  promethazine (PHENERGAN) 25 MG suppository Place 1 suppository (25 mg total) rectally every 8 (eight) hours as needed for up to 12 doses for nausea or vomiting. 01/10/21   Wyvonnia Dusky, MD      Allergies    Other    Review of Systems   Review of Systems  All other systems reviewed and are negative.   Physical Exam Updated Vital Signs BP 123/88 (BP Location: Right Arm)   Pulse (!) 126   Temp 98.3 F (36.8 C) (Oral)   Resp (!) 22   SpO2 97%  Physical Exam Vitals and nursing note reviewed.  Constitutional:      General: She is not in acute distress.    Appearance: She is well-developed. She  is not diaphoretic.  HENT:     Head: Normocephalic and atraumatic.  Cardiovascular:     Rate and Rhythm: Normal rate and regular rhythm.     Heart sounds: No murmur heard.    No friction rub. No gallop.  Pulmonary:     Effort: Pulmonary effort is normal. No respiratory distress.     Breath sounds: Normal breath sounds. No wheezing.  Abdominal:     General: Bowel sounds are normal. There is no distension.     Palpations: Abdomen is soft.     Tenderness: There is abdominal tenderness.     Comments: There is mild suprapubic tenderness noted  Genitourinary:    General: Normal vulva.     Comments: There is a trickle of clear fluid coming from the cervix.  No  bleeding or other abnormality noted. Musculoskeletal:        General: Normal range of motion.     Cervical back: Normal range of motion and neck supple.  Skin:    General: Skin is warm and dry.  Neurological:     General: No focal deficit present.     Mental Status: She is alert and oriented to person, place, and time.     ED Results / Procedures / Treatments   Labs (all labs ordered are listed, but only abnormal results are displayed) Labs Reviewed - No data to display  EKG None  Radiology No results found.  Procedures Procedures    Medications Ordered in ED Medications - No data to display  ED Course/ Medical Decision Making/ A&P  Patient is a 27 year old female G1 at 2 weeks with triplet gestation up until yesterday when she had an elective reduction performed at [facility].  This evening, she noticed a rush of fluid from her vagina and down her leg and caused what she described as a "small puddle" on the floor.  She called the high risk OB department at [facility], then was told to come to the nearest ER.  Patient arrives here clinically well-appearing with no active bleeding.  Speculum exam performed showing a slow trickle of clear to yellow fluid from the cervix, but no bleeding.    Care discussed with Dr. Anda Kraft from [facility] high risk OB.  It is her recommendation that patient come to [facility] labor and delivery for further evaluation.  Mode of transportation discussed with Dr. Anda Kraft.  Her and I are both comfortable with transfer by private auto.  Patient is not bleeding and denies significant discomfort and I feel can safely travel by private auto.  This discussed with patient who is also comfortable with this plan.  Final Clinical Impression(s) / ED Diagnoses Final diagnoses:  None    Rx / DC Orders ED Discharge Orders     None         Veryl Speak, MD 08/03/22 [phone]

## 2022-08-03 NOTE — ED Triage Notes (Signed)
14w pregnant with triplets. Fetal reduce proformed yesterday to reduce to twins. Woke tonight with rush of leaking fluid and abdominal cramping. Started around 1:30AM

## 2022-08-20 ENCOUNTER — Observation Stay (HOSPITAL_BASED_OUTPATIENT_CLINIC_OR_DEPARTMENT_OTHER)
Admission: EM | Admit: 2022-08-20 | Discharge: 2022-08-21 | Disposition: A | Discharge status: Home / Self Care | Payer: Medicaid Other | Attending: Obstetrics and Gynecology | Admitting: Obstetrics and Gynecology

## 2022-08-20 ENCOUNTER — Encounter (HOSPITAL_COMMUNITY): Payer: Self-pay | Admitting: Obstetrics and Gynecology

## 2022-08-20 ENCOUNTER — Other Ambulatory Visit: Payer: Self-pay

## 2022-08-20 DIAGNOSIS — O30002 Twin pregnancy, unspecified number of placenta and unspecified number of amniotic sacs, second trimester: Secondary | ICD-10-CM

## 2022-08-20 DIAGNOSIS — Z3A17 17 weeks gestation of pregnancy: Secondary | ICD-10-CM

## 2022-08-20 DIAGNOSIS — O99512 Diseases of the respiratory system complicating pregnancy, second trimester: Secondary | ICD-10-CM | POA: Insufficient documentation

## 2022-08-20 DIAGNOSIS — Z87891 Personal history of nicotine dependence: Secondary | ICD-10-CM | POA: Insufficient documentation

## 2022-08-20 DIAGNOSIS — Z79899 Other long term (current) drug therapy: Secondary | ICD-10-CM | POA: Insufficient documentation

## 2022-08-20 DIAGNOSIS — Z3A16 16 weeks gestation of pregnancy: Secondary | ICD-10-CM | POA: Insufficient documentation

## 2022-08-20 DIAGNOSIS — O30102 Triplet pregnancy, unspecified number of placenta and unspecified number of amniotic sacs, second trimester: Secondary | ICD-10-CM | POA: Diagnosis present

## 2022-08-20 DIAGNOSIS — O30122 Triplet pregnancy with two or more monoamniotic fetuses, second trimester: Secondary | ICD-10-CM | POA: Diagnosis not present

## 2022-08-20 DIAGNOSIS — O4690 Antepartum hemorrhage, unspecified, unspecified trimester: Secondary | ICD-10-CM | POA: Diagnosis present

## 2022-08-20 DIAGNOSIS — J45909 Unspecified asthma, uncomplicated: Secondary | ICD-10-CM | POA: Diagnosis not present

## 2022-08-20 DIAGNOSIS — O200 Threatened abortion: Secondary | ICD-10-CM | POA: Insufficient documentation

## 2022-08-20 DIAGNOSIS — F419 Anxiety disorder, unspecified: Secondary | ICD-10-CM | POA: Diagnosis present

## 2022-08-20 LAB — CBC WITH DIFFERENTIAL/PLATELET
Abs Immature Granulocytes: 0.77 10*3/uL — ABNORMAL HIGH (ref 0.00–0.07)
Basophils Absolute: 0.1 10*3/uL (ref 0.0–0.1)
Basophils Relative: 1 %
Eosinophils Absolute: 0.3 10*3/uL (ref 0.0–0.5)
Eosinophils Relative: 1 %
HCT: 30.7 % — ABNORMAL LOW (ref 36.0–46.0)
Hemoglobin: 10.5 g/dL — ABNORMAL LOW (ref 12.0–15.0)
Immature Granulocytes: 3 %
Lymphocytes Relative: 11 %
Lymphs Abs: 2.5 10*3/uL (ref 0.7–4.0)
MCH: 31.2 pg (ref 26.0–34.0)
MCHC: 34.2 g/dL (ref 30.0–36.0)
MCV: 91.1 fL (ref 80.0–100.0)
Monocytes Absolute: 1.4 10*3/uL — ABNORMAL HIGH (ref 0.1–1.0)
Monocytes Relative: 6 %
Neutro Abs: 18.2 10*3/uL — ABNORMAL HIGH (ref 1.7–7.7)
Neutrophils Relative %: 78 %
Platelets: 352 10*3/uL (ref 150–400)
RBC: 3.37 MIL/uL — ABNORMAL LOW (ref 3.87–5.11)
RDW: 12.3 % (ref 11.5–15.5)
WBC: 23.2 10*3/uL — ABNORMAL HIGH (ref 4.0–10.5)
nRBC: 0 % (ref 0.0–0.2)

## 2022-08-20 LAB — COMPREHENSIVE METABOLIC PANEL
ALT: 23 U/L (ref 0–44)
AST: 25 U/L (ref 15–41)
Albumin: 3.6 g/dL (ref 3.5–5.0)
Alkaline Phosphatase: 74 U/L (ref 38–126)
Anion gap: 12 (ref 5–15)
BUN: <5 mg/dL — ABNORMAL LOW (ref 6–20)
CO2: 19 mmol/L — ABNORMAL LOW (ref 22–32)
Calcium: 9.1 mg/dL (ref 8.9–10.3)
Chloride: 105 mmol/L (ref 98–111)
Creatinine, Ser: 0.4 mg/dL — ABNORMAL LOW (ref 0.44–1.00)
GFR, Estimated: >60 mL/min (ref >60)
Glucose, Bld: 81 mg/dL (ref 70–99)
Potassium: 3.8 mmol/L (ref 3.5–5.1)
Sodium: 136 mmol/L (ref 135–145)
Total Bilirubin: 0.2 mg/dL — ABNORMAL LOW (ref 0.3–1.2)
Total Protein: 7.2 g/dL (ref 6.5–8.1)

## 2022-08-20 LAB — TYPE AND SCREEN
ABO/RH(D): A POS
Antibody Screen: NEGATIVE

## 2022-08-20 LAB — HCG, QUANTITATIVE, PREGNANCY: hCG, Beta Chain, Quant, S: 32906 m[IU]/mL — ABNORMAL HIGH (ref <5)

## 2022-08-20 MED ORDER — LACTATED RINGERS IV SOLN: 125.0000 mL/h | INTRAVENOUS | Status: DC

## 2022-08-20 MED ORDER — ONDANSETRON HCL 4 MG/2ML IJ SOLN
4.0000 mg | Freq: Once | INTRAMUSCULAR | Status: AC
  Administered 2022-08-20: 4 mg via INTRAVENOUS
  Filled 2022-08-20: qty 2

## 2022-08-20 MED ORDER — CALCIUM CARBONATE ANTACID 500 MG PO CHEW: 2.0000 | CHEWABLE_TABLET | ORAL | Status: DC | PRN

## 2022-08-20 MED ORDER — SODIUM CHLORIDE 0.9 % IV SOLN
1000.0000 mL | INTRAVENOUS | Status: DC
  Administered 2022-08-20 – 2022-08-21 (×2): 1000 mL via INTRAVENOUS

## 2022-08-20 MED ORDER — SODIUM CHLORIDE 0.9 % IV BOLUS (SEPSIS)
1000.0000 mL | Freq: Once | INTRAVENOUS | Status: AC
  Administered 2022-08-20: 1000 mL via INTRAVENOUS

## 2022-08-20 MED ORDER — ACETAMINOPHEN 325 MG PO TABS: 650.0000 mg | ORAL_TABLET | ORAL | Status: DC | PRN

## 2022-08-20 MED ORDER — PRENATAL MULTIVITAMIN CH
1.0000 | ORAL_TABLET | Freq: Every day | ORAL | Status: DC
  Administered 2022-08-21: 1 via ORAL
  Filled 2022-08-20: qty 1

## 2022-08-20 MED ORDER — ORAL CARE MOUTH RINSE: 15.0000 mL | OROMUCOSAL | Status: DC | PRN

## 2022-08-20 MED ORDER — LACTATED RINGERS IV BOLUS
1000.0000 mL | Freq: Once | INTRAVENOUS | Status: AC
  Administered 2022-08-20: 1000 mL via INTRAVENOUS

## 2022-08-20 MED ORDER — DOCUSATE SODIUM 100 MG PO CAPS
100.0000 mg | ORAL_CAPSULE | Freq: Every day | ORAL | Status: DC
  Administered 2022-08-21: 100 mg via ORAL
  Filled 2022-08-20: qty 1

## 2022-08-20 MED ORDER — FENTANYL CITRATE (PF) 100 MCG/2ML IJ SOLN
50.0000 ug | Freq: Once | INTRAMUSCULAR | Status: AC
  Administered 2022-08-20: 50 ug via INTRAVENOUS
  Filled 2022-08-20: qty 2

## 2022-08-20 NOTE — ED Notes (Signed)
Fetus given to Significant Other for Transfer.

## 2022-08-20 NOTE — H&P (Signed)
History     CSN: [id number]  Arrival date and time: 08/20/22 [id number]   Event Date/Time   First Provider Initiated Contact with Patient 08/20/22 2018      Chief Complaint  Patient presents with   Laboring   HPI  Carol Paul is a 27 y.o. G1P0000 at 16 weeks 3 days with a now twin pregnancy due to selective reduction who presents for evaluation of abdominal pain and bleeding. Patient reports she was having intense cramping at home and felt like she felt something in her vagina. She reports she went to the bathroom and was bleeding heavily so she believed it was a clot. She then delivered the deceased fetus from the selective reduction. She presented to [facility] ED for further evaluation.   She reports she is still having some contractions, reports a few an hour, but significantly improved from earlier. Patient rates the pain as a 5/10 and has not tried anything for the pain.   She reports she felt one gush of bleeding since her transfer from the ED. She is unsure if she delivered the placenta at home. She feels some pressure but is unsure if she is just worried or something is happening.  She is appropriately upset and tearful about the delivery. Her wife is at the bedside for support and contributing to history taking.  OB History     Gravida  1   Para  0   Term  0   Preterm  0   AB  0   Living  0      SAB  0   IAB  0   Ectopic  0   Multiple  0   Live Births  0           Past Medical History:  Diagnosis Date   Asthma     Past Surgical History:  Procedure Laterality Date   TONSILLECTOMY      Family History  Problem Relation Age of Onset   CAD Other    Diabetes Mother     Social History   Tobacco Use   Smoking status: Former    Passive exposure: Current   Smokeless tobacco: Never  Vaping Use   Vaping Use: Every day   Substances: Nicotine, THC, Flavoring  Substance Use Topics   Alcohol use: Not Currently    Comment: social   Drug  use: Not Currently    Types: Marijuana    Allergies:  Allergies  Allergen Reactions   Other Other (See Comments)    GI discomfort to wheat, soy, eggs, milk and sesame    Medications Prior to Admission  Medication Sig Dispense Refill Last Dose   acetaminophen (TYLENOL) 650 MG CR tablet Take 650 mg by mouth every 8 (eight) hours as needed for pain.   08/20/2022 at 1000   albuterol (VENTOLIN HFA) 108 (90 Base) MCG/ACT inhaler Inhale 1-2 puffs into the lungs every 6 (six) hours as needed for wheezing or shortness of breath. 8 g 0 08/20/2022 at 1200   cyclobenzaprine (FLEXERIL) 10 MG tablet Take 1 tablet (10 mg total) by mouth 3 (three) times daily as needed for muscle spasms. 30 tablet 0 08/20/2022 at 1000   ALPRAZolam (XANAX) 1 MG tablet Take 1 mg by mouth daily as needed for anxiety.       amphetamine-dextroamphetamine (ADDERALL) 20 MG tablet Take 20 mg by mouth See admin instructions. 2-3 times daily.      famotidine (PEPCID) 20 MG tablet Take  1 tablet (20 mg total) by mouth 2 (two) times daily. (Patient taking differently: Take 20 mg by mouth 2 (two) times daily as needed for heartburn. ) 20 tablet 0    HYDROcodone-acetaminophen (NORCO) 5-325 MG tablet Take 1 tablet by mouth every 4 (four) hours as needed for moderate pain. 10 tablet 0    ibuprofen (ADVIL) 200 MG tablet Take 200 mg by mouth every 6 (six) hours as needed for mild pain.      loratadine (CLARITIN) 10 MG tablet Take 1 tablet by mouth daily as needed.      methylphenidate (RITALIN) 20 MG tablet Take 20 mg by mouth 3 (three) times daily.      methylPREDNISolone (MEDROL DOSEPAK) 4 MG TBPK tablet Use as directed 21 tablet 0    ondansetron (ZOFRAN-ODT) 4 MG disintegrating tablet Take 1 tablet (4 mg total) by mouth every 8 (eight) hours as needed for nausea or vomiting. 10 tablet 0    promethazine (PHENERGAN) 25 MG suppository Place 1 suppository (25 mg total) rectally every 8 (eight) hours as needed for up to 12 doses for nausea or  vomiting. 12 each 0     Review of Systems  Constitutional: Negative.  Negative for fatigue and fever.  HENT: Negative.    Respiratory: Negative.  Negative for shortness of breath.   Cardiovascular: Negative.  Negative for chest pain.  Gastrointestinal:  Positive for abdominal pain. Negative for constipation, diarrhea, nausea and vomiting.  Genitourinary:  Positive for vaginal bleeding. Negative for dysuria and vaginal discharge.  Neurological: Negative.  Negative for dizziness and headaches.   Physical Exam   Blood pressure (!) 118/58, pulse (!) 128, temperature 99.6 F (37.6 C), temperature source Temporal, resp. rate (!) 27, height 5' 2"$  (1.575 m), weight 90.7 kg, SpO2 93 %.  Patient Vitals for the past 24 hrs:  BP Temp Temp src Pulse Resp SpO2 Height Weight  08/20/22 2004 (!) 118/58 -- -- (!) 128 -- -- -- --  08/20/22 1821 -- 99.6 F (37.6 C) Temporal -- -- -- -- --  08/20/22 1816 106/75 -- -- (!) 133 (!) 27 93 % -- --  08/20/22 1814 -- -- -- -- -- -- 5' 2"$  (1.575 m) 90.7 kg    Physical Exam Vitals and nursing note reviewed.  Constitutional:      General: She is not in acute distress.    Appearance: She is well-developed.  HENT:     Head: Normocephalic.  Eyes:     Pupils: Pupils are equal, round, and reactive to light.  Cardiovascular:     Rate and Rhythm: Normal rate and regular rhythm.     Heart sounds: Normal heart sounds.  Pulmonary:     Effort: Pulmonary effort is normal. No respiratory distress.     Breath sounds: Normal breath sounds.  Abdominal:     General: Bowel sounds are normal. There is no distension.     Palpations: Abdomen is soft.     Tenderness: There is no abdominal tenderness.  Genitourinary:    Comments: SSE: small amount of bright red blood in vault, one strand of membranes removed from vagina, scant amount of bright red active bleeding noted from cervix. Cervix visually 1-2cm- digital exam deferred Skin:    General: Skin is warm and dry.   Neurological:     Mental Status: She is alert and oriented to person, place, and time.  Psychiatric:        Mood and Affect: Mood normal.  Behavior: Behavior normal.        Thought Content: Thought content normal.        Judgment: Judgment normal.    MAU Course  Procedures  Results for orders placed or performed during the hospital encounter of 08/20/22 (from the past 24 hour(s))  Comprehensive metabolic panel     Status: Abnormal   Collection Time: 08/20/22  6:22 PM  Result Value Ref Range   Sodium 136 135 - 145 mmol/L   Potassium 3.8 3.5 - 5.1 mmol/L   Chloride 105 98 - 111 mmol/L   CO2 19 (L) 22 - 32 mmol/L   Glucose, Bld 81 70 - 99 mg/dL   BUN <5 (L) 6 - 20 mg/dL   Creatinine, Ser 0.40 (L) 0.44 - 1.00 mg/dL   Calcium 9.1 8.9 - 10.3 mg/dL   Total Protein 7.2 6.5 - 8.1 g/dL   Albumin 3.6 3.5 - 5.0 g/dL   AST 25 15 - 41 U/L   ALT 23 0 - 44 U/L   Alkaline Phosphatase 74 38 - 126 U/L   Total Bilirubin 0.2 (L) 0.3 - 1.2 mg/dL   GFR, Estimated >60 >60 mL/min   Anion gap 12 5 - 15  CBC with Differential     Status: Abnormal   Collection Time: 08/20/22  6:22 PM  Result Value Ref Range   WBC 23.2 (H) 4.0 - 10.5 K/uL   RBC 3.37 (L) 3.87 - 5.11 MIL/uL   Hemoglobin 10.5 (L) 12.0 - 15.0 g/dL   HCT 30.7 (L) 36.0 - 46.0 %   MCV 91.1 80.0 - 100.0 fL   MCH 31.2 26.0 - 34.0 pg   MCHC 34.2 30.0 - 36.0 g/dL   RDW 12.3 11.5 - 15.5 %   Platelets 352 150 - 400 K/uL   nRBC 0.0 0.0 - 0.2 %   Neutrophils Relative % 78 %   Neutro Abs 18.2 (H) 1.7 - 7.7 K/uL   Lymphocytes Relative 11 %   Lymphs Abs 2.5 0.7 - 4.0 K/uL   Monocytes Relative 6 %   Monocytes Absolute 1.4 (H) 0.1 - 1.0 K/uL   Eosinophils Relative 1 %   Eosinophils Absolute 0.3 0.0 - 0.5 K/uL   Basophils Relative 1 %   Basophils Absolute 0.1 0.0 - 0.1 K/uL   Immature Granulocytes 3 %   Abs Immature Granulocytes 0.77 (H) 0.00 - 0.07 K/uL    MDM Prenatal records from community office reviewed. Pregnancy complicated  by selective reduction of triplet pregnancy, home insemination to conceive Labs ordered and reviewed.   Pt informed that the ultrasound is considered a limited OB ultrasound and is not intended to be a complete ultrasound exam.  Patient also informed that the ultrasound is not being completed with the intent of assessing for fetal or placental anomalies or any pelvic abnormalities.  Explained that the purpose of today's ultrasound is to assess for  viability.  Patient acknowledges the purpose of the exam and the limitations of the study.    FHR: Baby A: 162 bpm Baby B (previously C): 178 bpm  Dr. Harolyn Rutherford notified of patient arrival in MAU- will admit to [facility] for observation overnight and consult with MFM in the AM  Assessment and Plan   1. Threatened miscarriage   2. [redacted] weeks gestation of pregnancy   3. Twin pregnancy following selective fetal reduction in second trimester    -Admit to [facility] turned over to MD  Wende Mott, CNM 08/20/2022, 8:32 PM

## 2022-08-20 NOTE — ED Notes (Signed)
Carol Paul with [facility] called for transport

## 2022-08-20 NOTE — ED Notes (Signed)
Report called to [facility] by [facility] RN

## 2022-08-20 NOTE — ED Notes (Signed)
[facility] at bedside 

## 2022-08-20 NOTE — ED Notes (Signed)
Placed patient on stretcher in lobby.  Patient's father handed me a bag with what he stated was a fetus that was not alive,  contained in a zip lock bag wrapped in paper towel.  Bag wrapped in towel and given to [facility] RN.  Patient taken back to RESU immediately upon arrival

## 2022-08-20 NOTE — ED Provider Notes (Signed)
[facility] Provider Note   CSN: [id number] Arrival date & time: 08/20/22  1804     History  Chief Complaint  Patient presents with   Laboring    Carol Paul is a 27 y.o. female.  HPI   Patient is G1, P0 [place] and triplets who presents to the ED after a spontaneous carriage.  Patient initially presented to the ED at [facility] where she was admitted on February 12.  Patient was having abdominal pain and vaginal bleeding.  Patient was ultimately discharged.  Patient was at home today when she started having severe abdominal cramping.  Patient went to the bathroom and then passed an intact fetus.  Patient then presented to the ED here.  She is not currently having any bleeding.  She does feel like the contractions have passed.  Patient is obviously distressed.  Home Medications Prior to Admission medications   Medication Sig Start Date End Date Taking? Authorizing Provider  albuterol (VENTOLIN HFA) 108 (90 Base) MCG/ACT inhaler Inhale 1-2 puffs into the lungs every 6 (six) hours as needed for wheezing or shortness of breath. 04/07/20   Henderly, Britni A, PA-C  ALPRAZolam (XANAX) 1 MG tablet Take 1 mg by mouth daily as needed for anxiety.     [provider]  amphetamine-dextroamphetamine (ADDERALL) 20 MG tablet Take 20 mg by mouth See admin instructions. 2-3 times daily.    [provider]  cyclobenzaprine (FLEXERIL) 10 MG tablet Take 1 tablet (10 mg total) by mouth 3 (three) times daily as needed for muscle spasms. [id number]   Delora Fuel, MD  famotidine (PEPCID) 20 MG tablet Take 1 tablet (20 mg total) by mouth 2 (two) times daily. Patient taking differently: Take 20 mg by mouth 2 (two) times daily as needed for heartburn.  11/08/16   Doreatha Lew, MD  HYDROcodone-acetaminophen [facility]) 5-325 MG tablet Take 1 tablet by mouth every 4 (four) hours as needed for moderate pain. [id number]   Delora Fuel, MD  ibuprofen (ADVIL)  200 MG tablet Take 200 mg by mouth every 6 (six) hours as needed for mild pain.    [provider]  loratadine (CLARITIN) 10 MG tablet Take 1 tablet by mouth daily as needed.    [provider]  methylphenidate (RITALIN) 20 MG tablet Take 20 mg by mouth 3 (three) times daily. 12/15/20   [provider]  methylPREDNISolone (MEDROL DOSEPAK) 4 MG TBPK tablet Use as directed 05/28/21   Kathe Becton R, PA-C  ondansetron (ZOFRAN-ODT) 4 MG disintegrating tablet Take 1 tablet (4 mg total) by mouth every 8 (eight) hours as needed for nausea or vomiting. 06/27/22   Davonna Belling, MD  promethazine (PHENERGAN) 25 MG suppository Place 1 suppository (25 mg total) rectally every 8 (eight) hours as needed for up to 12 doses for nausea or vomiting. 01/10/21   Wyvonnia Dusky, MD      Allergies    Other    Review of Systems   Review of Systems  Physical Exam Updated Vital Signs BP 106/75 (BP Location: Right Arm)   Pulse (!) 133   Temp 99.6 F (37.6 C) (Temporal)   Resp (!) 27   Ht 1.575 m (5' 2"$ )   Wt 90.7 kg   SpO2 93%   BMI 36.57 kg/m  Physical Exam Vitals and nursing note reviewed.  Constitutional:      General: She is in acute distress.     Appearance: She is well-developed.  Comments: Anxious  HENT:     Head: Normocephalic and atraumatic.     Right Ear: External ear normal.     Left Ear: External ear normal.  Eyes:     General: No scleral icterus.       Right eye: No discharge.        Left eye: No discharge.     Conjunctiva/sclera: Conjunctivae normal.  Neck:     Trachea: No tracheal deviation.  Cardiovascular:     Rate and Rhythm: Normal rate and regular rhythm.  Pulmonary:     Effort: Pulmonary effort is normal. No respiratory distress.     Breath sounds: Normal breath sounds. No stridor. No wheezing or rales.  Abdominal:     General: Bowel sounds are normal. There is no distension.     Palpations: Abdomen is soft.     Tenderness: There is no  abdominal tenderness. There is no guarding or rebound.     Comments: Gravid uterus  Genitourinary:    Comments: Sterile bimanual exam performed, no fetus palpated or placental tissue noted Musculoskeletal:        General: No tenderness or deformity.     Cervical back: Neck supple.  Skin:    General: Skin is warm and dry.     Findings: No rash.  Neurological:     General: No focal deficit present.     Mental Status: She is alert.     Cranial Nerves: No cranial nerve deficit, dysarthria or facial asymmetry.     Sensory: No sensory deficit.     Motor: No abnormal muscle tone or seizure activity.     Coordination: Coordination normal.     ED Results / Procedures / Treatments   Labs (all labs ordered are listed, but only abnormal results are displayed) Labs Reviewed  CBC WITH DIFFERENTIAL/PLATELET - Abnormal; Notable for the following components:      Result Value   WBC 23.2 (*)    RBC 3.37 (*)    Hemoglobin 10.5 (*)    HCT 30.7 (*)    Neutro Abs 18.2 (*)    Monocytes Absolute 1.4 (*)    Abs Immature Granulocytes 0.77 (*)    All other components within normal limits  COMPREHENSIVE METABOLIC PANEL  HCG, QUANTITATIVE, PREGNANCY    EKG None  Radiology No results found.  Procedures Ultrasound ED OB Pelvic  Date/Time: 08/20/2022 6:34 PM  Performed by: Dorie Rank, MD Authorized by: Dorie Rank, MD   Procedure details:    Indications: evaluate for IUP     Assess:  Intrauterine pregnancy   Technique:  Transabdominal obstetric (HCG+) exam   Images: archived    Uterine findings:    Intrauterine pregnancy: identified     Fetal heart rate: identified      Comments:     2 separate intrauterine pregnancy is noted.  Cardiac activity noted on both.     Medications Ordered in ED Medications  sodium chloride 0.9 % bolus 1,000 mL (1,000 mLs Intravenous New Bag/Given 08/20/22 1826)    Followed by  0.9 %  sodium chloride infusion (has no administration in time range)    ED  Course/ Medical Decision Making/ A&P                             Medical Decision Making Amount and/or Complexity of Data Reviewed Labs: ordered.  Risk Prescription drug management. Decision regarding hospitalization.   Presented to the ED  for evaluation after spontaneous miscarriage.  Patient had triplets.  She still has 2 intrauterine pregnancies.  Patient noted to have fetal cardiac activity in both.  Patient states she does not feel like she is contracting at this time.  She has been placed on monitors.  IV fluids and laboratory test have been ordered.  Patient is currently at a freestanding emergency room.  I do think she warrants emergent transfer to [facility].  We are not seeing signs of active contractions or miscarriage at this time.  I have consulted with Dr. Elgie Congo.  [facility] to [facility]        Final Clinical Impression(s) / ED Diagnoses Final diagnoses:  Miscarriage at 8 to [redacted] weeks gestation  Triplet gestation in second trimester, unspecified multiple gestation type    Rx / DC Orders ED Discharge Orders     None         Dorie Rank, MD 08/20/22 1836

## 2022-08-20 NOTE — ED Triage Notes (Signed)
Patient here POV from Home.  Pregnant with Triplets. Contractions for 5 Hours. States she felt something exit from vaginal canal shortly PTA which appears to be fetus.  Uncomfortable during Triage. A&Ox4. GCS 15. BIB Wheelchair.

## 2022-08-21 ENCOUNTER — Observation Stay (HOSPITAL_BASED_OUTPATIENT_CLINIC_OR_DEPARTMENT_OTHER): Payer: Medicaid Other

## 2022-08-21 DIAGNOSIS — Z3A16 16 weeks gestation of pregnancy: Secondary | ICD-10-CM

## 2022-08-21 DIAGNOSIS — O4692 Antepartum hemorrhage, unspecified, second trimester: Secondary | ICD-10-CM | POA: Diagnosis not present

## 2022-08-21 DIAGNOSIS — O3132X0 Continuing pregnancy after elective fetal reduction of one fetus or more, second trimester, not applicable or unspecified: Secondary | ICD-10-CM

## 2022-08-21 DIAGNOSIS — O30102 Triplet pregnancy, unspecified number of placenta and unspecified number of amniotic sacs, second trimester: Secondary | ICD-10-CM | POA: Diagnosis not present

## 2022-08-21 DIAGNOSIS — O208 Other hemorrhage in early pregnancy: Secondary | ICD-10-CM

## 2022-08-21 DIAGNOSIS — O3132X1 Continuing pregnancy after elective fetal reduction of one fetus or more, second trimester, fetus 1: Secondary | ICD-10-CM

## 2022-08-21 DIAGNOSIS — O3132X3 Continuing pregnancy after elective fetal reduction of one fetus or more, second trimester, fetus 3: Secondary | ICD-10-CM

## 2022-08-21 DIAGNOSIS — Z3A17 17 weeks gestation of pregnancy: Secondary | ICD-10-CM

## 2022-08-21 DIAGNOSIS — O30002 Twin pregnancy, unspecified number of placenta and unspecified number of amniotic sacs, second trimester: Secondary | ICD-10-CM | POA: Diagnosis not present

## 2022-08-21 DIAGNOSIS — F419 Anxiety disorder, unspecified: Secondary | ICD-10-CM | POA: Diagnosis present

## 2022-08-21 MED ORDER — DM-GUAIFENESIN ER 30-600 MG PO TB12
1.0000 | ORAL_TABLET | Freq: Two times a day (BID) | ORAL | Status: DC
  Administered 2022-08-21: 1 via ORAL
  Filled 2022-08-21 (×2): qty 1

## 2022-08-21 MED ORDER — ONDANSETRON 4 MG PO TBDP
4.0000 mg | ORAL_TABLET | Freq: Four times a day (QID) | ORAL | Status: DC | PRN
  Administered 2022-08-21: 4 mg via ORAL
  Filled 2022-08-21: qty 1

## 2022-08-21 MED ORDER — LORAZEPAM 1 MG PO TABS
1.0000 mg | ORAL_TABLET | Freq: Four times a day (QID) | ORAL | Status: DC | PRN
  Administered 2022-08-21: 1 mg via ORAL
  Filled 2022-08-21: qty 1

## 2022-08-21 NOTE — Progress Notes (Signed)
Pt given discharge instructions and all questions answered. IV discontinued. Pt in stable condition.

## 2022-08-21 NOTE — Progress Notes (Signed)
[facility] COMPREHENSIVE PROGRESS NOTE  Carol Paul is a 27 y.o. G1P0000 at 86w4dwho is admitted for observation after delivery of demised fetus; patient had elective reduction from triplet to twin gestation around 185weeks gestation.  Estimated Date of Delivery: 02/01/23  Length of Stay:  0 Days. Admitted 08/20/2022  Subjective: Patient is comfortable but anxious. Requesting something to help her anxiety and for sleep. Has taken Xanax in the past.  She reports scant bleeding and no loss of fluid per vagina.  Vitals:  Blood pressure 105/64, pulse (!) 118, temperature 98 F (36.7 C), temperature source Oral, resp. rate 17, height 5' 2"$  (1.575 m), weight 90.7 kg, SpO2 99 %. Physical Examination: CONSTITUTIONAL: Well-developed, well-nourished female in no acute distress.  NEUROLOGIC: Alert and oriented to person, place, and time. No cranial nerve deficit noted. PSYCHIATRIC: Normal mood and affect. Normal behavior. Normal judgment and thought content. CARDIOVASCULAR: Normal heart rate noted, regular rhythm RESPIRATORY: Effort and breath sounds normal, no problems with respiration noted MUSCULOSKELETAL: Normal range of motion. No edema and no tenderness. 2+ distal pulses. ABDOMEN: Soft, nontender, nondistended, gravid. CERVIX:  Deferred   Results for orders placed or performed during the hospital encounter of 08/20/22 (from the past 48 hour(s))  Comprehensive metabolic panel     Status: Abnormal   Collection Time: 08/20/22  6:22 PM  Result Value Ref Range   Sodium 136 135 - 145 mmol/L   Potassium 3.8 3.5 - 5.1 mmol/L   Chloride 105 98 - 111 mmol/L   CO2 19 (L) 22 - 32 mmol/L   Glucose, Bld 81 70 - 99 mg/dL    Comment: Glucose reference range applies only to samples taken after fasting for at least 8 hours.   BUN <5 (L) 6 - 20 mg/dL   Creatinine, Ser 0.40 (L) 0.44 - 1.00 mg/dL   Calcium 9.1 8.9 - 10.3 mg/dL   Total Protein 7.2 6.5 - 8.1 g/dL   Albumin 3.6 3.5 - 5.0  g/dL   AST 25 15 - 41 U/L   ALT 23 0 - 44 U/L   Alkaline Phosphatase 74 38 - 126 U/L   Total Bilirubin 0.2 (L) 0.3 - 1.2 mg/dL   GFR, Estimated >60 >60 mL/min    Comment: (NOTE) Calculated using the CKD-EPI Creatinine Equation (2021)    Anion gap 12 5 - 15    Comment: Performed at [facility] [address] [id number] CBC with Differential     Status: Abnormal   Collection Time: 08/20/22  6:22 PM  Result Value Ref Range   WBC 23.2 (H) 4.0 - 10.5 K/uL   RBC 3.37 (L) 3.87 - 5.11 MIL/uL   Hemoglobin 10.5 (L) 12.0 - 15.0 g/dL   HCT 30.7 (L) 36.0 - 46.0 %   MCV 91.1 80.0 - 100.0 fL   MCH 31.2 26.0 - 34.0 pg   MCHC 34.2 30.0 - 36.0 g/dL   RDW 12.3 11.5 - 15.5 %   Platelets 352 150 - 400 K/uL   nRBC 0.0 0.0 - 0.2 %   Neutrophils Relative % 78 %   Neutro Abs 18.2 (H) 1.7 - 7.7 K/uL   Lymphocytes Relative 11 %   Lymphs Abs 2.5 0.7 - 4.0 K/uL   Monocytes Relative 6 %   Monocytes Absolute 1.4 (H) 0.1 - 1.0 K/uL   Eosinophils Relative 1 %   Eosinophils Absolute 0.3 0.0 - 0.5 K/uL   Basophils Relative 1 %  Basophils Absolute 0.1 0.0 - 0.1 K/uL   Immature Granulocytes 3 %   Abs Immature Granulocytes 0.77 (H) 0.00 - 0.07 K/uL    Comment: Performed at [facility], [address] [place], [place] [address]  hCG, quantitative, pregnancy     Status: Abnormal   Collection Time: 08/20/22  6:22 PM  Result Value Ref Range   hCG, Beta Chain, Quant, S 32,906 (H) <5 mIU/mL    Comment:          GEST. AGE      CONC.  (mIU/mL)   <=1 WEEK        5 - 50     2 WEEKS       50 - 500     3 WEEKS       100 - 10,000     4 WEEKS     1,000 - 30,000     5 WEEKS     3,500 - 115,000   6-8 WEEKS     12,000 - 270,000    12 WEEKS     15,000 - 220,000        FEMALE AND NON-PREGNANT FEMALE:     LESS THAN 5 mIU/mL RESULT CONFIRMED BY AUTOMATED DILUTION Performed at [facility], [address] [place], [place] [address]   Type  and screen [facility]     Status: None   Collection Time: 08/20/22 10:30 PM  Result Value Ref Range   ABO/RH(D) A POS    Antibody Screen NEG    Sample Expiration      08/23/2022,2359 Performed at [facility], [place] [address], [place], [place] [address]     No results found.  Current scheduled medications  docusate sodium  100 mg Oral Daily   prenatal multivitamin  1 tablet Oral Q1200    I have reviewed the patient's current medications.  ASSESSMENT: Principal Problem:   Vaginal bleeding during pregnancy Active Problems:   Twin pregnancy following selective fetal reduction, second trimester   Anxiety   PLAN: Ordered for ultrasound and consult by MFM this morning, will follow up results and recommendations. Patient desires to continue her care here at [facility], message sent to office to schedule initial OB visit. Ativan ordered prn anxiety and sleep for now If patient is stable, she will likely be discharged to home later today. Continue routine antenatal care.   Verita Schneiders, MD, [facility] [employer], [place]

## 2022-08-21 NOTE — Discharge Summary (Signed)
Antenatal Physician Discharge Summary  Patient ID: [name] MRN: [id number] DOB/AGE: 1996/03/03 27 y.o.  Admit date: 08/20/2022 Discharge date: 08/21/2022  Admission Diagnoses:  twin pregnancy, loss of third fetus due to fetal reduction  Discharge Diagnoses: same  Prenatal Procedures: ultrasound  Consults:  Maternal Fetal Medicine  Hospital Course:  Carol Paul is a 27 y.o. G1P0000 with IUP at 69w4dadmitted for observation due to passage of one fetus from selective reduction of triplet B at [facility]  She was admitted after passage of the triplet at [facility]  Pt still had a small amount of vaginal bleeding and scant membranes which were removed.  Pt denied any further abdominal pain or cramping during her stay.  She currently only has spotting.  The patient had viability ultrasound which showed viable twin pregnancy.  She had consultation with MFM, Dr. SDonalee Citrin  She was deemed stable for discharge to home with outpatient follow up.  Discharge Exam: Temp:  [97.8 F (36.6 C)-99.7 F (37.6 C)] 97.8 F (36.6 C) (02/14 1213) Pulse Rate:  [114-133] 128 (02/14 1213) Resp:  [16-27] 18 (02/14 1213) BP: (95-118)/(43-76) 107/54 (02/14 1213) SpO2:  [93 %-100 %] 99 % (02/14 1213) Weight:  [90.7 kg] 90.7 kg (02/13 1814) Physical Examination: CONSTITUTIONAL: Well-developed, well-nourished female in no acute distress.  HENT:  Normocephalic, atraumatic, External right and left ear normal. Oropharynx is clear and moist EYES: Conjunctivae and EOM are normal.  NECK: Normal range of motion, supple, no masses SKIN: Skin is warm and dry. No rash noted. Not diaphoretic. No erythema. No pallor. [place] Alert and oriented to person, place, and time. Normal reflexes, muscle tone coordination. No cranial nerve deficit noted. PSYCHIATRIC: Normal mood and affect. Normal behavior. Normal judgment and thought content. CARDIOVASCULAR: Normal heart rate noted, regular rhythm RESPIRATORY: Effort and  breath sounds normal, no problems with respiration noted MUSCULOSKELETAL: Normal range of motion. No edema and no tenderness. 2+ distal pulses. ABDOMEN: Soft, nontender, nondistended, gravid. CERVIX:  deferred  Significant Diagnostic Studies:  Results for orders placed or performed during the hospital encounter of 08/20/22 (from the past 168 hour(s))  Comprehensive metabolic panel   Collection Time: 08/20/22  6:22 PM  Result Value Ref Range   Sodium 136 135 - 145 mmol/L   Potassium 3.8 3.5 - 5.1 mmol/L   Chloride 105 98 - 111 mmol/L   CO2 19 (L) 22 - 32 mmol/L   Glucose, Bld 81 70 - 99 mg/dL   BUN <5 (L) 6 - 20 mg/dL   Creatinine, Ser 0.40 (L) 0.44 - 1.00 mg/dL   Calcium 9.1 8.9 - 10.3 mg/dL   Total Protein 7.2 6.5 - 8.1 g/dL   Albumin 3.6 3.5 - 5.0 g/dL   AST 25 15 - 41 U/L   ALT 23 0 - 44 U/L   Alkaline Phosphatase 74 38 - 126 U/L   Total Bilirubin 0.2 (L) 0.3 - 1.2 mg/dL   GFR, Estimated >60 >60 mL/min   Anion gap 12 5 - 15  CBC with Differential   Collection Time: 08/20/22  6:22 PM  Result Value Ref Range   WBC 23.2 (H) 4.0 - 10.5 K/uL   RBC 3.37 (L) 3.87 - 5.11 MIL/uL   Hemoglobin 10.5 (L) 12.0 - 15.0 g/dL   HCT 30.7 (L) 36.0 - 46.0 %   MCV 91.1 80.0 - 100.0 fL   MCH 31.2 26.0 - 34.0 pg   MCHC 34.2 30.0 - 36.0 g/dL   RDW 12.3 11.5 - 15.5 %  Platelets 352 150 - 400 K/uL   nRBC 0.0 0.0 - 0.2 %   Neutrophils Relative % 78 %   Neutro Abs 18.2 (H) 1.7 - 7.7 K/uL   Lymphocytes Relative 11 %   Lymphs Abs 2.5 0.7 - 4.0 K/uL   Monocytes Relative 6 %   Monocytes Absolute 1.4 (H) 0.1 - 1.0 K/uL   Eosinophils Relative 1 %   Eosinophils Absolute 0.3 0.0 - 0.5 K/uL   Basophils Relative 1 %   Basophils Absolute 0.1 0.0 - 0.1 K/uL   Immature Granulocytes 3 %   Abs Immature Granulocytes 0.77 (H) 0.00 - 0.07 K/uL  hCG, quantitative, pregnancy   Collection Time: 08/20/22  6:22 PM  Result Value Ref Range   hCG, Beta Chain, Quant, S 32,906 (H) <5 mIU/mL  Type and screen [place]   Collection Time: 08/20/22 10:30 PM  Result Value Ref Range   ABO/RH(D) A POS    Antibody Screen NEG    Sample Expiration      08/23/2022,2359 Performed at [facility], [place] [address] [place], [place] [address]    [place] MFM OB LIMITED  Result Date: 08/21/2022 ----------------------------------------------------------------------  OBSTETRICS REPORT                        (Signed Final 08/21/2022 01:23 pm) ---------------------------------------------------------------------- Patient Info  ID #:       [id number]                          D.O.B.:  [birth year] (26 yrs)  Name:       Carol Paul                Visit Date: 08/21/2022 09:41 am ---------------------------------------------------------------------- Performed By  Attending:        Tama High MD        Ref. Address:      [address]                                                              [place], [place]  Performed By:     Berlinda Last          Secondary Phy.:    [facility]                    RDMS                                                              Care  Referred By:      Sallyanne Havers  Location[facility]                    ANYANWU MD                                [place] ---------------------------------------------------------------------- Orders  #  Description                           Code        Ordered By  1  [place] MFM OB LIMITED                     76815.01    Verita Schneiders  2  [place] MFM OB TRANSVAGINAL                [id number]     Verita Schneiders ----------------------------------------------------------------------  #  Order #                     Accession #                Episode #  1  [id number]                   [id number]                 [id number]  2  [id number]                   [id number]                 [id number] ---------------------------------------------------------------------- Indications   Twin pregnancy following elective fetal         O30.003, O31.30X0  reduction, antepartum  Twin pregnancy, di/di, second trimester         O30.042  [redacted] weeks gestation of pregnancy                 Z3A.16  Encounter for cervical length                   Z36.86 ---------------------------------------------------------------------- Fetal Evaluation (Fetus A)  Num Of Fetuses:          2  Fetal Heart Rate(bpm):   159  Cardiac Activity:        Observed  Fetal Lie:               Lower Fetus  Presentation:            Breech  Placenta:                Anterior  P. Cord Insertion:       Visualized  Membrane Desc:      Dividing Membrane seen - Dichorionic.  Amniotic Fluid  AFI FV:      Within normal limits                              Largest Pocket(cm)                              2 ---------------------------------------------------------------------- OB History  Gravidity:    1         Term:   0        Prem:   0  SAB:   0  TOP:          0       Ectopic:  0        Living: 0 ---------------------------------------------------------------------- Gestational Age (Fetus A)  Best:          16w 4d     Det. By:  Loman Chroman         EDD:   02/01/23 ---------------------------------------------------------------------- Anatomy (Fetus A)  Cranium:               Appears normal         Stomach:                Appears normal, left                                                                        sided  Ventricles:            Appears normal         Bladder:                Appears normal  Choroid Plexus:        Appears normal ---------------------------------------------------------------------- Fetal Evaluation (Fetus B)  Num Of Fetuses:          2  Fetal Heart Rate(bpm):   134  Cardiac Activity:        Observed  Fetal Lie:               Upper Fetus  Presentation:            Transverse, head to maternal left  Placenta:                Anterior  P. Cord Insertion:       Visualized  Membrane Desc:      Dividing Membrane seen -  Dichorionic.  Amniotic Fluid  AFI FV:      Within normal limits                              Largest Pocket(cm)                              3.3 ---------------------------------------------------------------------- Gestational Age (Fetus B)  Best:          16w 4d     Det. By:  Loman Chroman         EDD:   02/01/23 ---------------------------------------------------------------------- Anatomy (Fetus B)  Cranium:               Appears normal         Stomach:                Appears normal, left                                                                        sided  Ventricles:            Appears normal         Spine:                  Appears normal  Choroid Plexus:        Appears normal ---------------------------------------------------------------------- Cervix Uterus Adnexa  Cervix  Length:            1.8  cm.  Measured transvaginally  Uterus  No abnormality visualized.  Right Ovary  Within normal limits.  Left Ovary  Within normal limits.  Adnexa  No abnormality visualized ---------------------------------------------------------------------- Impression  We confirmed a dichorionic-diamniotic twin pregnancy.  Twin A: Lower fetus, breech presentation, anterior placenta.  Amniotic fluid is normal and good fetal heart activity seen.  Twin B: Upper fetus, transverse lie and head to maternal left,  anterior placenta.  Amniotic fluid is normal and good fetal  heart activity seen.  We performed a transvaginal ultrasound.  The cervix  measures 1.8 to 2.5 cm.  xxxxxxxxxxxxxxxxxxxxxxxxxxxxxxxxxxxxxxxxxxxxxxxxxxxxxxxxx  xxxxxxxxxxxxxxxxxxxxxxxxxxxxxxxxxxxxx  Consultation  I had the pleasure of seeing Ms. Coss today at the [facility]-  Specialty Unit. She is G1 P0 at 16w 4d gestation with natural  triplet pregnancy with reduction of one fetus at 13-weeks'  gestation. Patient presented to the MAU yesterday evening  with complaints of abdominal pain and vaginal bleeding.  She had multifetal pregnancy reduction (from triplets  to twins)  at [facility] at 13 weeks and 6 days gestation.  Triplet B was reduced.  Chorionic villous sampling was  performed on triplet A and C and karyotype was reported as  normal. Female and female fetuses were reported.  Pregnancy  was conceived by donor insemination at home.  The donor is  Caucasian and no genetic studies were performed.  She has  a same-sex partner with her.  4 days ago, patient presented to [facility] with  complaints of bleeding and the cervix was 2 cm dilated.  Yesterday she had severe abdominal pain and passed the  reduced fetus (planning to bury; declined pathology).  Patient  brought the fetus with her for confirmation.  Past medical history: No history of diabetes or hypertension  or any chronic medical conditions.  Past surgical history: Tonsillectomy, ankle surgery.  Medications: Prenatal vitamins, Phenergan.  Allergies: No known drug allergies.  Social history: Denies tobacco or drug or alcohol use.  Family history: No history of venous thromboembolism in the  family.  GYN history: No history of abnormal Pap smears or cervical  surgeries.  P/E: Patient is comfortably lying in bed; not in distress.  Vitals: Stable; afebrile.  HEENT: Normal.  Abdomen: Soft; no tenderness in any of the quadrants; no  flank tenderness.  No pedal edema.  Labs: Hemoglobin 10.5, hematocrit 30.7, WBC 23.2, platelets  352.  Electrolytes normal.  Creatinine 0.4.  AST 25, ALT 23.  Blood type A positive.  Triplet pregnancy with miscarriage of the reduced fetus.  Before multifetal pregnancy reduction procedure, the patient  was given an 8% chance of miscarriage of all fetuses from  the procedure.  I reassured the patient of normal heart  activities and the surviving fetuses.  The likelihood of miscarriage is little slightly higher but should  decrease with cessation of vaginal bleeding.  Cervical  shortening seen he is more likely from the miscarriage.  I recommended a follow-up transvaginal  ultrasound next  week to evaluate the cervical length.  If significant cervical  shortening  is seen (less than 1 cm), we will consider rescue  cerclage.  Patient had questions about vaginal progesterone.  delivery rates and twins.  However, at her recent study told it  may be beneficial after 32 weeks and twin pregnancy.  We do  not routinely recommend vaginal progesterone in twin  pregnancies.  Patient was told to return to the MAU if she has significant  bleeding or fever or severe abdominal pain.  [facility] and has a new OB appointment. ---------------------------------------------------------------------- Recommendations  -Transvaginal ultrasound on 08/30/2022 at 8:30 AM at [facility] [place].  -Fetal anatomy scan and cervical length measurement on  09/10/2022 at 11:15 AM.  -[facility] appointment with [facility]. ----------------------------------------------------------------------                  Tama High, MD Electronically Signed Final Report   08/21/2022 01:23 pm ----------------------------------------------------------------------  [place] MFM OB Transvaginal  Result Date: 08/21/2022 ----------------------------------------------------------------------  OBSTETRICS REPORT                        (Signed Final 08/21/2022 01:23 pm) ---------------------------------------------------------------------- Patient Info  ID #:       [id number]                          D.O.B.:  1995/12/07 (26 yrs)  Name:       LATRINDA CARAVEO                Visit Date: 08/21/2022 09:41 am ---------------------------------------------------------------------- Performed By  Attending:        Tama High MD        Ref. Address:      [place], [place]  Performed By:     Berlinda Last          Secondary Phy.:    [facility]  Specialty                    RDMS                                                              Care  Referred By:      Sallyanne Havers               Location:          [facility]  [place] ---------------------------------------------------------------------- Orders  #  Description                           Code        Ordered By  1  [place] MFM OB LIMITED                     76815.01    Verita Schneiders  2  [place] MFM OB TRANSVAGINAL                [id number]     Verita Schneiders ----------------------------------------------------------------------  #  Order #                     Accession #                Episode #  1  [id number]                   [id number]                 [id number]  2  [id number]                   [id number]                 [id number] ---------------------------------------------------------------------- Indications  Twin pregnancy following elective fetal         O30.003, O31.30X0  reduction, antepartum  Twin pregnancy, di/di, second trimester         O30.042  [redacted] weeks gestation of pregnancy                 Z3A.16  Encounter for cervical length                   Z36.86 ---------------------------------------------------------------------- Fetal Evaluation (Fetus A)  Num Of Fetuses:          2  Fetal Heart Rate(bpm):   159  Cardiac Activity:        Observed  Fetal Lie:               Lower Fetus  Presentation:            Breech  Placenta:                Anterior  P. Cord Insertion:       Visualized  Membrane Desc:      Dividing Membrane seen - Dichorionic.  Amniotic Fluid  AFI FV:      Within normal limits                              Largest Pocket(cm)                              2 ---------------------------------------------------------------------- OB History  Gravidity:    1         Term:   0        Prem:   0        SAB:   0  TOP:          0       Ectopic:  0  Living: 0 ----------------------------------------------------------------------  Gestational Age (Fetus A)  Best:          16w 4d     Det. By:  Loman Chroman         EDD:   02/01/23 ---------------------------------------------------------------------- Anatomy (Fetus A)  Cranium:               Appears normal         Stomach:                Appears normal, left                                                                        sided  Ventricles:            Appears normal         Bladder:                Appears normal  Choroid Plexus:        Appears normal ---------------------------------------------------------------------- Fetal Evaluation (Fetus B)  Num Of Fetuses:          2  Fetal Heart Rate(bpm):   134  Cardiac Activity:        Observed  Fetal Lie:               Upper Fetus  Presentation:            Transverse, head to maternal left  Placenta:                Anterior  P. Cord Insertion:       Visualized  Membrane Desc:      Dividing Membrane seen - Dichorionic.  Amniotic Fluid  AFI FV:      Within normal limits                              Largest Pocket(cm)                              3.3 ---------------------------------------------------------------------- Gestational Age (Fetus B)  Best:          16w 4d     Det. By:  Loman Chroman         EDD:   02/01/23 ---------------------------------------------------------------------- Anatomy (Fetus B)  Cranium:               Appears normal         Stomach:                Appears normal, left                                                                        sided  Ventricles:            Appears normal         Spine:  Appears normal  Choroid Plexus:        Appears normal ---------------------------------------------------------------------- Cervix Uterus Adnexa  Cervix  Length:            1.8  cm.  Measured transvaginally  Uterus  No abnormality visualized.  Right Ovary  Within normal limits.  Left Ovary  Within normal limits.  Adnexa  No abnormality visualized  ---------------------------------------------------------------------- Impression  We confirmed a dichorionic-diamniotic twin pregnancy.  Twin A: Lower fetus, breech presentation, anterior placenta.  Amniotic fluid is normal and good fetal heart activity seen.  Twin B: Upper fetus, transverse lie and head to maternal left,  anterior placenta.  Amniotic fluid is normal and good fetal  heart activity seen.  We performed a transvaginal ultrasound.  The cervix  measures 1.8 to 2.5 cm.  xxxxxxxxxxxxxxxxxxxxxxxxxxxxxxxxxxxxxxxxxxxxxxxxxxxxxxxxx  xxxxxxxxxxxxxxxxxxxxxxxxxxxxxxxxxxxxx  Consultation  I had the pleasure of seeing Ms. Seth today at the [facility]. She is G1 P0 at 16w 4d gestation with natural  triplet pregnancy with reduction of one fetus at 13-weeks'  gestation. Patient presented to the MAU yesterday evening  with complaints of abdominal pain and vaginal bleeding.  She had multifetal pregnancy reduction (from triplets to twins)  at [facility] at 13 weeks and 6 days gestation.  Triplet B was reduced.  Chorionic villous sampling was  performed on triplet A and C and karyotype was reported as  normal. Female and female fetuses were reported.  Pregnancy  was conceived by donor insemination at home.  The donor is  Caucasian and no genetic studies were performed.  She has  a same-sex partner with her.  4 days ago, patient presented to [facility] with  complaints of bleeding and the cervix was 2 cm dilated.  Yesterday she had severe abdominal pain and passed the  reduced fetus (planning to bury; declined pathology).  Patient  brought the fetus with her for confirmation.  Past medical history: No history of diabetes or hypertension  or any chronic medical conditions.  Past surgical history: Tonsillectomy, ankle surgery.  Medications: Prenatal vitamins, Phenergan.  Allergies: No known drug allergies.  Social history: Denies tobacco or drug or alcohol use.  Family history: No history of  venous thromboembolism in the  family.  GYN history: No history of abnormal Pap smears or cervical  surgeries.  P/E: Patient is comfortably lying in bed; not in distress.  Vitals: Stable; afebrile.  HEENT: Normal.  Abdomen: Soft; no tenderness in any of the quadrants; no  flank tenderness.  No pedal edema.  Labs: Hemoglobin 10.5, hematocrit 30.7, WBC 23.2, platelets  352.  Electrolytes normal.  Creatinine 0.4.  AST 25, ALT 23.  Blood type A positive.  Triplet pregnancy with miscarriage of the reduced fetus.  Before multifetal pregnancy reduction procedure, the patient  was given an 8% chance of miscarriage of all fetuses from  the procedure.  I reassured the patient of normal heart  activities and the surviving fetuses.  The likelihood of miscarriage is little slightly higher but should  decrease with cessation of vaginal bleeding.  Cervical  shortening seen he is more likely from the miscarriage.  I recommended a follow-up transvaginal ultrasound next  week to evaluate the cervical length.  If significant cervical  shortening is seen (less than 1 cm), we will consider rescue  cerclage.  Patient had questions about vaginal progesterone.  delivery rates and twins.  However, at her recent study told it  may be beneficial after 32 weeks and  twin pregnancy.  We do  not routinely recommend vaginal progesterone in twin  pregnancies.  Patient was told to return to the MAU if she has significant  bleeding or fever or severe abdominal pain.  [facility] and has a new OB appointment. ---------------------------------------------------------------------- Recommendations  -Transvaginal ultrasound on 08/30/2022 at 8:30 AM at [facility] [place].  -Fetal anatomy scan and cervical length measurement on  09/10/2022 at 11:15 AM.  -[facility]. ----------------------------------------------------------------------                   Tama High, MD Electronically Signed Final Report   08/21/2022 01:23 pm ----------------------------------------------------------------------   Future Appointments  Date Time Provider [place]  08/30/2022  8:30 AM [facility] NURSE [facility]  08/30/2022  8:45 AM [facility] US5 [facility]  09/09/2022  9:55 AM Griffin Basil, MD [facility]  09/10/2022  1:15 PM [facility] NURSE [facility]  09/10/2022  1:30 PM [facility] US2 [facility]    Discharge Condition: Stable  Discharge disposition: 01-Home or Self Care       Discharge Instructions     Discharge activity:  No Restrictions   Complete by: As directed    Discharge diet:  No restrictions   Complete by: As directed    Do not have sex or do anything that might make you have an orgasm   Complete by: As directed    Pelvic rest   Notify physician for a general feeling that "something is not right"   Complete by: As directed    Notify physician for increase or change in vaginal discharge   Complete by: As directed    Notify physician for intestinal cramps, with or without diarrhea, sometimes described as "gas pain"   Complete by: As directed    Notify physician for leaking of fluid   Complete by: As directed    Notify physician for low, dull backache, unrelieved by heat or Tylenol   Complete by: As directed    Notify physician for menstrual like cramps   Complete by: As directed    Notify physician for pelvic pressure   Complete by: As directed    Notify physician for uterine contractions.  These may be painless and feel like the uterus is tightening or the baby is  "balling up"   Complete by: As directed    Notify physician for vaginal bleeding   Complete by: As directed    PRETERM LABOR:  Includes any of the follwing symptoms that occur between 20 - [redacted] weeks gestation.  If these symptoms are not stopped, preterm labor can result in preterm delivery, placing your baby at risk   Complete by: As directed       Allergies  as of 08/21/2022       Reactions   Other Other (See Comments)   GI discomfort to wheat, soy, eggs, milk and sesame        Medication List     STOP taking these medications    ALPRAZolam 1 MG tablet Commonly known as: XANAX   amphetamine-dextroamphetamine 20 MG tablet Commonly known as: ADDERALL   cyclobenzaprine 10 MG tablet Commonly known as: FLEXERIL   famotidine 20 MG tablet Commonly known as: PEPCID   HYDROcodone-acetaminophen 5-325 MG tablet Commonly known as: Norco   ibuprofen 200 MG tablet Commonly known as: ADVIL   methylphenidate 20 MG tablet Commonly known as: RITALIN  methylPREDNISolone 4 MG Tbpk tablet Commonly known as: MEDROL DOSEPAK       TAKE these medications    acetaminophen 650 MG CR tablet Commonly known as: TYLENOL Take 650 mg by mouth every 8 (eight) hours as needed for pain.   albuterol 108 (90 Base) MCG/ACT inhaler Commonly known as: VENTOLIN HFA Inhale 1-2 puffs into the lungs every 6 (six) hours as needed for wheezing or shortness of breath.   loratadine 10 MG tablet Commonly known as: CLARITIN Take 1 tablet by mouth daily as needed.   ondansetron 4 MG disintegrating tablet Commonly known as: ZOFRAN-ODT Take 1 tablet (4 mg total) by mouth every 8 (eight) hours as needed for nausea or vomiting.   promethazine 25 MG suppository Commonly known as: PHENERGAN Place 1 suppository (25 mg total) rectally every 8 (eight) hours as needed for up to 12 doses for nausea or vomiting.        Follow-up Information     Griffin Basil, MD. Go on 09/10/2022.   Specialty: Obstetrics and Gynecology [facility]: [facility] Contact information: [address] [address] [phone]               Pt has transvaginal ultrasound scheduled for 08/30/22 and anatomy scan/CL on 09/10/22  Total discharge time: 30 minutes   Signed: Griffin Basil M.D. 08/21/2022, 2:21 PM

## 2022-08-21 NOTE — Consult Note (Signed)
Maternal-Fetal Medicine   Name: Carol Paul DOB: [birth year] MRN: [id number] Referring Provider: Lynnda Shields, MD  I had the pleasure of seeing Carol Paul today at the Ob-Specialty Unit. She is G1 P0 at 16w 4d gestation with natural triplet pregnancy with reduction of one fetus at 13-weeks' gestation. Patient presented to the MAU yesterday evening with complaints of abdominal pain and vaginal bleeding.  She had multifetal pregnancy reduction (from triplets to twins) at [facility] at 13 weeks and 6 days gestation.  Triplet B was reduced.  Chorionic villous sampling was performed on triplet A and C and karyotype was reported as normal. Female and female fetuses were reported.  Pregnancy was conceived by donor insemination at home.  The donor is Caucasian and no genetic studies were performed.  She has a same-sex partner with her.  4 days ago, patient presented to [facility] with complaints of bleeding and the cervix was 2 cm dilated.  Yesterday she had severe abdominal pain and passed the reduced fetus (planning to bury; declined pathology).  Patient brought the fetus with her for confirmation.  Past medical history: No history of diabetes or hypertension or any chronic medical conditions. Past surgical history: Tonsillectomy, ankle surgery. Medications: Prenatal vitamins, Phenergan. Allergies: No known drug allergies. Social history: Denies tobacco or drug or alcohol use. Family history: No history of venous thromboembolism in the family. GYN history: No history of abnormal Pap smears or cervical surgeries.  P/E: Patient is comfortably lying in bed; not in distress. Vitals: Stable; afebrile. HEENT: Normal. Abdomen: Soft; no tenderness in any of the quadrants; no flank tenderness. No pedal edema.  Labs: Hemoglobin 10.5, hematocrit 30.7, WBC 23.2, platelets 352.  Electrolytes normal.  Creatinine 0.4.  AST 25, ALT 23.  Blood type A positive.  Ultrasound We confirmed a  dichorionic-diamniotic twin pregnancy. Twin A: Lower fetus, breech presentation, anterior placenta.  Amniotic fluid is normal and good fetal heart activity seen. Twin B: Upper fetus, transverse lie and head to maternal left, anterior placenta.  Amniotic fluid is normal and good fetal heart activity seen.  We performed a transvaginal ultrasound.  The cervix measures 1.8 to 2.5 cm.  Triplet pregnancy with miscarriage of the reduced fetus. Before multifetal pregnancy reduction procedure, the patient was given an 8% chance of miscarriage of all fetuses from the procedure.  I reassured the patient of normal heart activities and the surviving fetuses. The likelihood of miscarriage is little slightly higher but should decrease with cessation of vaginal bleeding.  Cervical shortening seen he is more likely from the miscarriage. I recommended a follow-up transvaginal ultrasound next week to evaluate the cervical length.  If significant cervical shortening is seen (less than 1 cm), we will consider rescue cerclage.  Patient had questions about vaginal progesterone. Vaginal progesterone has not shown to decrease preterm delivery rates and twins.  However, at her recent study told it may be beneficial after 32 weeks and twin pregnancy.  We do not routinely recommend vaginal progesterone in twin pregnancies. Patient was told to return to the MAU if she has significant bleeding or fever or severe abdominal pain.  [facility] and has a new OB appointment.  Recommendations -Transvaginal ultrasound on 08/30/2022 at 8:30 AM at [place] for [place]. -Fetal anatomy scan and cervical length measurement on 09/10/2022 at 11:15 AM. -New OB appointment with [facility].  Thank you for consultation.  If you have any questions or  concerns, please contact me the [facility].  Consultation including face-to-face counseling 45  minutes.

## 2022-08-21 NOTE — Plan of Care (Signed)
  Problem: Education: Goal: Knowledge of disease or condition will improve Outcome: Completed/Met   Problem: Education: Goal: Knowledge of General Education information will improve Description: Including pain rating scale, medication(s)/side effects and non-pharmacologic comfort measures Outcome: Completed/Met   Problem: Activity: Goal: Risk for activity intolerance will decrease Outcome: Completed/Met   Problem: Nutrition: Goal: Adequate nutrition will be maintained Outcome: Completed/Met   Problem: Elimination: Goal: Will not experience complications related to urinary retention Outcome: Completed/Met

## 2022-08-21 NOTE — MAU Provider Note (Addendum)
Please refer to [place] for admission details.    Verita Schneiders, MD, [place] Attending [place], [facility]

## 2022-08-22 ENCOUNTER — Other Ambulatory Visit: Payer: Self-pay | Admitting: *Deleted

## 2022-08-22 DIAGNOSIS — O30009 Twin pregnancy, unspecified number of placenta and unspecified number of amniotic sacs, unspecified trimester: Secondary | ICD-10-CM

## 2022-08-22 DIAGNOSIS — O4690 Antepartum hemorrhage, unspecified, unspecified trimester: Secondary | ICD-10-CM

## 2022-08-24 ENCOUNTER — Other Ambulatory Visit: Payer: Self-pay

## 2022-08-24 ENCOUNTER — Inpatient Hospital Stay (HOSPITAL_COMMUNITY)
Admission: AD | Admit: 2022-08-24 | Discharge: 2022-08-29 | DRG: 832 | Disposition: A | Discharge status: Home / Self Care | Payer: Medicaid Other | Attending: Obstetrics & Gynecology | Admitting: Obstetrics & Gynecology

## 2022-08-24 ENCOUNTER — Encounter (HOSPITAL_COMMUNITY): Payer: Self-pay | Admitting: Obstetrics and Gynecology

## 2022-08-24 ENCOUNTER — Inpatient Hospital Stay (HOSPITAL_BASED_OUTPATIENT_CLINIC_OR_DEPARTMENT_OTHER): Payer: Medicaid Other

## 2022-08-24 ENCOUNTER — Inpatient Hospital Stay (HOSPITAL_COMMUNITY): Payer: Medicaid Other

## 2022-08-24 DIAGNOSIS — O30042 Twin pregnancy, dichorionic/diamniotic, second trimester: Secondary | ICD-10-CM | POA: Diagnosis present

## 2022-08-24 DIAGNOSIS — F419 Anxiety disorder, unspecified: Secondary | ICD-10-CM | POA: Diagnosis present

## 2022-08-24 DIAGNOSIS — O30122 Triplet pregnancy with two or more monoamniotic fetuses, second trimester: Secondary | ICD-10-CM | POA: Diagnosis present

## 2022-08-24 DIAGNOSIS — D62 Acute posthemorrhagic anemia: Secondary | ICD-10-CM | POA: Diagnosis not present

## 2022-08-24 DIAGNOSIS — O26872 Cervical shortening, second trimester: Secondary | ICD-10-CM | POA: Diagnosis present

## 2022-08-24 DIAGNOSIS — O209 Hemorrhage in early pregnancy, unspecified: Principal | ICD-10-CM | POA: Diagnosis present

## 2022-08-24 DIAGNOSIS — O3132X1 Continuing pregnancy after elective fetal reduction of one fetus or more, second trimester, fetus 1: Secondary | ICD-10-CM

## 2022-08-24 DIAGNOSIS — Z3A17 17 weeks gestation of pregnancy: Secondary | ICD-10-CM

## 2022-08-24 DIAGNOSIS — O4692 Antepartum hemorrhage, unspecified, second trimester: Secondary | ICD-10-CM | POA: Diagnosis not present

## 2022-08-24 DIAGNOSIS — O4690 Antepartum hemorrhage, unspecified, unspecified trimester: Secondary | ICD-10-CM

## 2022-08-24 DIAGNOSIS — F329 Major depressive disorder, single episode, unspecified: Secondary | ICD-10-CM | POA: Diagnosis present

## 2022-08-24 DIAGNOSIS — O30009 Twin pregnancy, unspecified number of placenta and unspecified number of amniotic sacs, unspecified trimester: Secondary | ICD-10-CM | POA: Diagnosis present

## 2022-08-24 DIAGNOSIS — O99012 Anemia complicating pregnancy, second trimester: Secondary | ICD-10-CM | POA: Diagnosis present

## 2022-08-24 DIAGNOSIS — Z20822 Contact with and (suspected) exposure to covid-19: Secondary | ICD-10-CM | POA: Diagnosis present

## 2022-08-24 DIAGNOSIS — O3132X3 Continuing pregnancy after elective fetal reduction of one fetus or more, second trimester, fetus 3: Secondary | ICD-10-CM | POA: Diagnosis present

## 2022-08-24 DIAGNOSIS — O3132X0 Continuing pregnancy after elective fetal reduction of one fetus or more, second trimester, not applicable or unspecified: Principal | ICD-10-CM

## 2022-08-24 DIAGNOSIS — O30002 Twin pregnancy, unspecified number of placenta and unspecified number of amniotic sacs, second trimester: Principal | ICD-10-CM

## 2022-08-24 LAB — CBC
HCT: 29.9 % — ABNORMAL LOW (ref 36.0–46.0)
Hemoglobin: 10.1 g/dL — ABNORMAL LOW (ref 12.0–15.0)
MCH: 31.5 pg (ref 26.0–34.0)
MCHC: 33.8 g/dL (ref 30.0–36.0)
MCV: 93.1 fL (ref 80.0–100.0)
Platelets: 396 10*3/uL (ref 150–400)
RBC: 3.21 MIL/uL — ABNORMAL LOW (ref 3.87–5.11)
RDW: 12.3 % (ref 11.5–15.5)
WBC: 20.6 10*3/uL — ABNORMAL HIGH (ref 4.0–10.5)
nRBC: 0 % (ref 0.0–0.2)

## 2022-08-24 MED ORDER — PRENATAL MULTIVITAMIN CH
1.0000 | ORAL_TABLET | Freq: Every day | ORAL | Status: DC
  Administered 2022-08-25 – 2022-08-29 (×4): 1 via ORAL
  Filled 2022-08-24 (×4): qty 1

## 2022-08-24 MED ORDER — FENTANYL CITRATE (PF) 100 MCG/2ML IJ SOLN
100.0000 ug | Freq: Once | INTRAMUSCULAR | Status: AC
  Administered 2022-08-24: 100 ug via INTRAVENOUS
  Filled 2022-08-24: qty 2

## 2022-08-24 MED ORDER — CALCIUM CARBONATE ANTACID 500 MG PO CHEW: 2.0000 | CHEWABLE_TABLET | ORAL | Status: DC | PRN

## 2022-08-24 MED ORDER — LACTATED RINGERS IV SOLN: INTRAVENOUS | Status: DC

## 2022-08-24 MED ORDER — LACTATED RINGERS IV SOLN: 125.0000 mL/h | INTRAVENOUS | Status: DC

## 2022-08-24 MED ORDER — ACETAMINOPHEN 325 MG PO TABS: 650.0000 mg | ORAL_TABLET | ORAL | Status: DC | PRN

## 2022-08-24 MED ORDER — DOCUSATE SODIUM 100 MG PO CAPS
100.0000 mg | ORAL_CAPSULE | Freq: Every day | ORAL | Status: DC
  Administered 2022-08-25 – 2022-08-29 (×4): 100 mg via ORAL
  Filled 2022-08-24 (×5): qty 1

## 2022-08-24 MED ORDER — MORPHINE SULFATE (PF) 4 MG/ML IV SOLN
4.0000 mg | Freq: Once | INTRAVENOUS | Status: AC
  Administered 2022-08-24: 4 mg via INTRAMUSCULAR
  Filled 2022-08-24: qty 1

## 2022-08-24 MED ORDER — LACTATED RINGERS IV BOLUS
1000.0000 mL | Freq: Once | INTRAVENOUS | Status: AC
  Administered 2022-08-24: 1000 mL via INTRAVENOUS

## 2022-08-24 MED ORDER — OXYCODONE HCL 5 MG PO TABS
5.0000 mg | ORAL_TABLET | ORAL | Status: DC | PRN
  Administered 2022-08-24 – 2022-08-25 (×2): 5 mg via ORAL
  Filled 2022-08-24 (×2): qty 1

## 2022-08-24 NOTE — H&P (Signed)
History     CSN: [id number]  Arrival date and time: 08/24/22 1648   Event Date/Time   First Provider Initiated Contact with Patient 08/24/22 1738      Chief Complaint  Patient presents with   Vaginal Bleeding   Back Pain   Abdominal Pain   27 y.o. G1 @17$ .0 with multi gestation pregnancy presenting with VB and ctx. Of note she had a triplet pregnancy that was reduced to twins around 14 weeks. Pt reports having a BM this afternoon and passed some clots and possible tissue then started having ctx. Reports ctx q7 min. Denies LOF.     OB History     Gravida  1   Para  0   Term  0   Preterm  0   AB  0   Living  0      SAB  0   IAB  0   Ectopic  0   Multiple  0   Live Births  0           Past Medical History:  Diagnosis Date   Asthma     Past Surgical History:  Procedure Laterality Date   TONSILLECTOMY      Family History  Problem Relation Age of Onset   CAD Other    Diabetes Mother     Social History   Tobacco Use   Smoking status: Former    Passive exposure: Current   Smokeless tobacco: Never  Vaping Use   Vaping Use: Every day   Substances: Nicotine, THC, Flavoring  Substance Use Topics   Alcohol use: Not Currently    Comment: social   Drug use: Not Currently    Types: Marijuana    Allergies:  Allergies  Allergen Reactions   Other Other (See Comments)    GI discomfort to wheat, soy, eggs, milk and sesame    Medications Prior to Admission  Medication Sig Dispense Refill Last Dose   albuterol (VENTOLIN HFA) 108 (90 Base) MCG/ACT inhaler Inhale 1-2 puffs into the lungs every 6 (six) hours as needed for wheezing or shortness of breath. 8 g 0 08/24/2022   progesterone (PROMETRIUM) 200 MG capsule Take 200 mg by mouth daily.   08/23/2022   promethazine (PHENERGAN) 25 MG suppository Place 1 suppository (25 mg total) rectally every 8 (eight) hours as needed for up to 12 doses for nausea or vomiting. 12 each 0 08/24/2022   acetaminophen  (TYLENOL) 650 MG CR tablet Take 650 mg by mouth every 8 (eight) hours as needed for pain.   Unknown   loratadine (CLARITIN) 10 MG tablet Take 1 tablet by mouth daily as needed.   Unknown   ondansetron (ZOFRAN-ODT) 4 MG disintegrating tablet Take 1 tablet (4 mg total) by mouth every 8 (eight) hours as needed for nausea or vomiting. 10 tablet 0 Unknown    Review of Systems  Gastrointestinal:  Positive for abdominal pain.  Genitourinary:  Positive for vaginal bleeding. Negative for vaginal discharge.   Physical Exam   Blood pressure 121/78, pulse (!) 134, temperature 98.1 F (36.7 C), temperature source Oral, resp. rate 20, SpO2 95 %.  Physical Exam Vitals and nursing note reviewed. Exam conducted with a chaperone present.  Constitutional:      General: She is not in acute distress.    Appearance: Normal appearance.  HENT:     Head: Normocephalic and atraumatic.  Pulmonary:     Effort: Pulmonary effort is normal. No respiratory distress.  Abdominal:  Palpations: Abdomen is soft.     Tenderness: There is no abdominal tenderness.  Genitourinary:    Comments: SSE: small amt bloody mucous; visually 1cm SVE: 1/50 Musculoskeletal:        General: Normal range of motion.     Cervical back: Normal range of motion.  Skin:    General: Skin is warm and dry.  Neurological:     General: No focal deficit present.     Mental Status: She is alert and oriented to person, place, and time.  Psychiatric:        Mood and Affect: Mood normal.        Behavior: Behavior normal.    Results for orders placed or performed during the hospital encounter of 08/24/22 (from the past 24 hour(s))  CBC     Status: Abnormal   Collection Time: 08/24/22  5:56 PM  Result Value Ref Range   WBC 20.6 (H) 4.0 - 10.5 K/uL   RBC 3.21 (L) 3.87 - 5.11 MIL/uL   Hemoglobin 10.1 (L) 12.0 - 15.0 g/dL   HCT 29.9 (L) 36.0 - 46.0 %   MCV 93.1 80.0 - 100.0 fL   MCH 31.5 26.0 - 34.0 pg   MCHC 33.8 30.0 - 36.0 g/dL    RDW 12.3 11.5 - 15.5 %   Platelets 396 150 - 400 K/uL   nRBC 0.0 0.0 - 0.2 %     Media Information     MAU Course  Procedures Morphine  MDM Labs and [place] ordered. 1950: continues to feel painful ctx, medication helped some, requesting more. IVF and Fentanyl ordered. Transfer of care given Sonia Baller, CNM  08/24/2022 8:18 PM   Assessment and Plan  Reassessment (10:01 PM)  -Patient reports some improvement in pain with fentanyl dosing. -States that she continues to experience contractions. -She reports some continued bleeding and concern for miscarriage. -Provider informs patient that concerns are heard and previously discussed with MD.  Cautioned that admission does not guarantee resolution of symptoms.  -Dr. Eddie North contacted and agreeable to admission for patient comfort and reassurance. -Orders placed.  Maryann Conners MSN, CNM Advanced Practice Provider, [facility]

## 2022-08-24 NOTE — MAU Provider Note (Incomplete Revision)
History     CSN: [id number]  Arrival date and time: 08/24/22 1648   Event Date/Time   First Provider Initiated Contact with Patient 08/24/22 1738      Chief Complaint  Patient presents with   Vaginal Bleeding   Back Pain   Abdominal Pain   27 y.o. G1 @17$ .0 with multi gestation pregnancy presenting with VB and ctx. Of note she had a triplet pregnancy that was reduced to twins around 14 weeks. Pt reports having a BM this afternoon and passed some clots and possible tissue then started having ctx. Reports ctx q7 min. Denies LOF.     OB History     Gravida  1   Para  0   Term  0   Preterm  0   AB  0   Living  0      SAB  0   IAB  0   Ectopic  0   Multiple  0   Live Births  0           Past Medical History:  Diagnosis Date   Asthma     Past Surgical History:  Procedure Laterality Date   TONSILLECTOMY      Family History  Problem Relation Age of Onset   CAD Other    Diabetes Mother     Social History   Tobacco Use   Smoking status: Former    Passive exposure: Current   Smokeless tobacco: Never  Vaping Use   Vaping Use: Every day   Substances: Nicotine, THC, Flavoring  Substance Use Topics   Alcohol use: Not Currently    Comment: social   Drug use: Not Currently    Types: Marijuana    Allergies:  Allergies  Allergen Reactions   Other Other (See Comments)    GI discomfort to wheat, soy, eggs, milk and sesame    Medications Prior to Admission  Medication Sig Dispense Refill Last Dose   albuterol (VENTOLIN HFA) 108 (90 Base) MCG/ACT inhaler Inhale 1-2 puffs into the lungs every 6 (six) hours as needed for wheezing or shortness of breath. 8 g 0 08/24/2022   progesterone (PROMETRIUM) 200 MG capsule Take 200 mg by mouth daily.   08/23/2022   promethazine (PHENERGAN) 25 MG suppository Place 1 suppository (25 mg total) rectally every 8 (eight) hours as needed for up to 12 doses for nausea or vomiting. 12 each 0 08/24/2022   acetaminophen  (TYLENOL) 650 MG CR tablet Take 650 mg by mouth every 8 (eight) hours as needed for pain.   Unknown   loratadine (CLARITIN) 10 MG tablet Take 1 tablet by mouth daily as needed.   Unknown   ondansetron (ZOFRAN-ODT) 4 MG disintegrating tablet Take 1 tablet (4 mg total) by mouth every 8 (eight) hours as needed for nausea or vomiting. 10 tablet 0 Unknown    Review of Systems  Gastrointestinal:  Positive for abdominal pain.  Genitourinary:  Positive for vaginal bleeding. Negative for vaginal discharge.   Physical Exam   Blood pressure 121/78, pulse (!) 134, temperature 98.1 F (36.7 C), temperature source Oral, resp. rate 20, SpO2 95 %.  Physical Exam Vitals and nursing note reviewed. Exam conducted with a chaperone present.  Constitutional:      General: She is not in acute distress.    Appearance: Normal appearance.  HENT:     Head: Normocephalic and atraumatic.  Pulmonary:     Effort: Pulmonary effort is normal. No respiratory distress.  Abdominal:  Palpations: Abdomen is soft.     Tenderness: There is no abdominal tenderness.  Genitourinary:    Comments: SSE: small amt bloody mucous; visually 1cm SVE: 1/50 Musculoskeletal:        General: Normal range of motion.     Cervical back: Normal range of motion.  Skin:    General: Skin is warm and dry.  Neurological:     General: No focal deficit present.     Mental Status: She is alert and oriented to person, place, and time.  Psychiatric:        Mood and Affect: Mood normal.        Behavior: Behavior normal.    Results for orders placed or performed during the hospital encounter of 08/24/22 (from the past 24 hour(s))  CBC     Status: Abnormal   Collection Time: 08/24/22  5:56 PM  Result Value Ref Range   WBC 20.6 (H) 4.0 - 10.5 K/uL   RBC 3.21 (L) 3.87 - 5.11 MIL/uL   Hemoglobin 10.1 (L) 12.0 - 15.0 g/dL   HCT 29.9 (L) 36.0 - 46.0 %   MCV 93.1 80.0 - 100.0 fL   MCH 31.5 26.0 - 34.0 pg   MCHC 33.8 30.0 - 36.0 g/dL    RDW 12.3 11.5 - 15.5 %   Platelets 396 150 - 400 K/uL   nRBC 0.0 0.0 - 0.2 %     Media Information     MAU Course  Procedures Morphine  MDM Labs and [place] ordered. 1950: continues to feel painful ctx, medication helped some, requesting more. IVF and Fentanyl ordered. Transfer of care given Sonia Baller, [place]  08/24/2022 8:18 PM   Assessment and Plan

## 2022-08-24 NOTE — MAU Provider Note (Addendum)
History     CSN: [id number]  Arrival date and time: 08/24/22 1648   Event Date/Time   First Provider Initiated Contact with Patient 08/24/22 1738      Chief Complaint  Patient presents with   Vaginal Bleeding   Back Pain   Abdominal Pain   27 y.o. G1 '@17'$ .0 with multi gestation pregnancy presenting with VB and ctx. Of note she had a triplet pregnancy that was reduced to twins around 14 weeks. Pt reports having a BM this afternoon and passed some clots and possible tissue then started having ctx. Reports ctx q7 min. Denies LOF.     OB History     Gravida  1   Para  0   Term  0   Preterm  0   AB  0   Living  0      SAB  0   IAB  0   Ectopic  0   Multiple  0   Live Births  0           Past Medical History:  Diagnosis Date   Asthma     Past Surgical History:  Procedure Laterality Date   TONSILLECTOMY      Family History  Problem Relation Age of Onset   CAD Other    Diabetes Mother     Social History   Tobacco Use   Smoking status: Former    Passive exposure: Current   Smokeless tobacco: Never  Vaping Use   Vaping Use: Every day   Substances: Nicotine, THC, Flavoring  Substance Use Topics   Alcohol use: Not Currently    Comment: social   Drug use: Not Currently    Types: Marijuana    Allergies:  Allergies  Allergen Reactions   Other Other (See Comments)    GI discomfort to wheat, soy, eggs, milk and sesame    Medications Prior to Admission  Medication Sig Dispense Refill Last Dose   albuterol (VENTOLIN HFA) 108 (90 Base) MCG/ACT inhaler Inhale 1-2 puffs into the lungs every 6 (six) hours as needed for wheezing or shortness of breath. 8 g 0 08/24/2022   progesterone (PROMETRIUM) 200 MG capsule Take 200 mg by mouth daily.   08/23/2022   promethazine (PHENERGAN) 25 MG suppository Place 1 suppository (25 mg total) rectally every 8 (eight) hours as needed for up to 12 doses for nausea or vomiting. 12 each 0 08/24/2022   acetaminophen  (TYLENOL) 650 MG CR tablet Take 650 mg by mouth every 8 (eight) hours as needed for pain.   Unknown   loratadine (CLARITIN) 10 MG tablet Take 1 tablet by mouth daily as needed.   Unknown   ondansetron (ZOFRAN-ODT) 4 MG disintegrating tablet Take 1 tablet (4 mg total) by mouth every 8 (eight) hours as needed for nausea or vomiting. 10 tablet 0 Unknown    Review of Systems  Gastrointestinal:  Positive for abdominal pain.  Genitourinary:  Positive for vaginal bleeding. Negative for vaginal discharge.   Physical Exam   Blood pressure 121/78, pulse (!) 134, temperature 98.1 F (36.7 C), temperature source Oral, resp. rate 20, SpO2 95 %.  Physical Exam Vitals and nursing note reviewed. Exam conducted with a chaperone present.  Constitutional:      General: She is not in acute distress.    Appearance: Normal appearance.  HENT:     Head: Normocephalic and atraumatic.  Pulmonary:     Effort: Pulmonary effort is normal. No respiratory distress.  Abdominal:  Palpations: Abdomen is soft.     Tenderness: There is no abdominal tenderness.  Genitourinary:    Comments: SSE: small amt bloody mucous; visually 1cm SVE: 1/50 Musculoskeletal:        General: Normal range of motion.     Cervical back: Normal range of motion.  Skin:    General: Skin is warm and dry.  Neurological:     General: No focal deficit present.     Mental Status: She is alert and oriented to person, place, and time.  Psychiatric:        Mood and Affect: Mood normal.        Behavior: Behavior normal.    Results for orders placed or performed during the hospital encounter of 08/24/22 (from the past 24 hour(s))  CBC     Status: Abnormal   Collection Time: 08/24/22  5:56 PM  Result Value Ref Range   WBC 20.6 (H) 4.0 - 10.5 K/uL   RBC 3.21 (L) 3.87 - 5.11 MIL/uL   Hemoglobin 10.1 (L) 12.0 - 15.0 g/dL   HCT 29.9 (L) 36.0 - 46.0 %   MCV 93.1 80.0 - 100.0 fL   MCH 31.5 26.0 - 34.0 pg   MCHC 33.8 30.0 - 36.0 g/dL    RDW 12.3 11.5 - 15.5 %   Platelets 396 150 - 400 K/uL   nRBC 0.0 0.0 - 0.2 %     Media Information     MAU Course  Procedures Morphine  MDM Labs and [place] ordered. 1950: continues to feel painful ctx, medication helped some, requesting more. IVF and Fentanyl ordered. Transfer of care given Vonna Drafts  08/24/2022 8:18 PM   Assessment and Plan   -Patient requests admission. -Dr. Roselie Awkward notified.  Maryann Conners MSN, CNM Advanced Practice Provider, [facility]

## 2022-08-24 NOTE — MAU Note (Signed)
...  Carol Paul is a 27 y.o. at 2w0dhere in MAU reporting: Vaginal [facility] that [facility] She reports it increased last night and she recently passed two blood clots at home. She reports she is also experiencing lower back pain that wraps around to the front. Patient and her wife brought blood clots to MAU for evaluation.  Patient refused to have her weight obtained. She reports if it is medically necessary later in her care she will but for now she wishes not to as she will see it at home and she endorses an eating disorder.  Vitals:   08/24/22 1715  BP: 121/78  Pulse: (!) 134  Resp: 20  Temp: 98.1 F (36.7 C)  SpO2: 95%     FHT: did not doppler FHT. Twin gestation.

## 2022-08-25 ENCOUNTER — Inpatient Hospital Stay (HOSPITAL_BASED_OUTPATIENT_CLINIC_OR_DEPARTMENT_OTHER): Payer: Medicaid Other

## 2022-08-25 ENCOUNTER — Observation Stay (HOSPITAL_COMMUNITY): Payer: Medicaid Other

## 2022-08-25 DIAGNOSIS — O30009 Twin pregnancy, unspecified number of placenta and unspecified number of amniotic sacs, unspecified trimester: Secondary | ICD-10-CM | POA: Diagnosis present

## 2022-08-25 DIAGNOSIS — O30042 Twin pregnancy, dichorionic/diamniotic, second trimester: Secondary | ICD-10-CM | POA: Diagnosis present

## 2022-08-25 DIAGNOSIS — D62 Acute posthemorrhagic anemia: Secondary | ICD-10-CM | POA: Diagnosis present

## 2022-08-25 DIAGNOSIS — O3132X1 Continuing pregnancy after elective fetal reduction of one fetus or more, second trimester, fetus 1: Secondary | ICD-10-CM | POA: Diagnosis not present

## 2022-08-25 DIAGNOSIS — O3132X3 Continuing pregnancy after elective fetal reduction of one fetus or more, second trimester, fetus 3: Secondary | ICD-10-CM | POA: Diagnosis present

## 2022-08-25 DIAGNOSIS — Z3A17 17 weeks gestation of pregnancy: Secondary | ICD-10-CM | POA: Diagnosis not present

## 2022-08-25 DIAGNOSIS — O30122 Triplet pregnancy with two or more monoamniotic fetuses, second trimester: Secondary | ICD-10-CM | POA: Diagnosis present

## 2022-08-25 DIAGNOSIS — O99012 Anemia complicating pregnancy, second trimester: Secondary | ICD-10-CM | POA: Diagnosis present

## 2022-08-25 DIAGNOSIS — Z20822 Contact with and (suspected) exposure to covid-19: Secondary | ICD-10-CM | POA: Diagnosis present

## 2022-08-25 DIAGNOSIS — O4692 Antepartum hemorrhage, unspecified, second trimester: Secondary | ICD-10-CM | POA: Diagnosis not present

## 2022-08-25 DIAGNOSIS — O30102 Triplet pregnancy, unspecified number of placenta and unspecified number of amniotic sacs, second trimester: Secondary | ICD-10-CM

## 2022-08-25 DIAGNOSIS — O209 Hemorrhage in early pregnancy, unspecified: Secondary | ICD-10-CM | POA: Diagnosis present

## 2022-08-25 DIAGNOSIS — O26872 Cervical shortening, second trimester: Secondary | ICD-10-CM | POA: Diagnosis present

## 2022-08-25 DIAGNOSIS — I3139 Other pericardial effusion (noninflammatory): Secondary | ICD-10-CM | POA: Diagnosis not present

## 2022-08-25 LAB — CBC WITH DIFFERENTIAL/PLATELET
Abs Immature Granulocytes: 0.37 10*3/uL — ABNORMAL HIGH (ref 0.00–0.07)
Abs Immature Granulocytes: 0.52 10*3/uL — ABNORMAL HIGH (ref 0.00–0.07)
Basophils Absolute: 0 10*3/uL (ref 0.0–0.1)
Basophils Absolute: 0.1 10*3/uL (ref 0.0–0.1)
Basophils Relative: 0 %
Basophils Relative: 1 %
Eosinophils Absolute: 0.2 10*3/uL (ref 0.0–0.5)
Eosinophils Absolute: 0.2 10*3/uL (ref 0.0–0.5)
Eosinophils Relative: 2 %
Eosinophils Relative: 1 %
HCT: 24.3 % — ABNORMAL LOW (ref 36.0–46.0)
HCT: 28.4 % — ABNORMAL LOW (ref 36.0–46.0)
Hemoglobin: 8.4 g/dL — ABNORMAL LOW (ref 12.0–15.0)
Hemoglobin: 9.2 g/dL — ABNORMAL LOW (ref 12.0–15.0)
Immature Granulocytes: 3 %
Immature Granulocytes: 4 %
Lymphocytes Relative: 8 %
Lymphocytes Relative: 8 %
Lymphs Abs: 1 10*3/uL (ref 0.7–4.0)
Lymphs Abs: 1.1 10*3/uL (ref 0.7–4.0)
MCH: 32.2 pg (ref 26.0–34.0)
MCH: 31.3 pg (ref 26.0–34.0)
MCHC: 34.6 g/dL (ref 30.0–36.0)
MCHC: 32.4 g/dL (ref 30.0–36.0)
MCV: 93.1 fL (ref 80.0–100.0)
MCV: 96.6 fL (ref 80.0–100.0)
Monocytes Absolute: 0.4 10*3/uL (ref 0.1–1.0)
Monocytes Absolute: 0.5 10*3/uL (ref 0.1–1.0)
Monocytes Relative: 3 %
Monocytes Relative: 3 %
Neutro Abs: 11.3 10*3/uL — ABNORMAL HIGH (ref 1.7–7.7)
Neutro Abs: 11.3 10*3/uL — ABNORMAL HIGH (ref 1.7–7.7)
Neutrophils Relative %: 84 %
Neutrophils Relative %: 83 %
Platelets: 272 10*3/uL (ref 150–400)
Platelets: 308 10*3/uL (ref 150–400)
RBC: 2.61 MIL/uL — ABNORMAL LOW (ref 3.87–5.11)
RBC: 2.94 MIL/uL — ABNORMAL LOW (ref 3.87–5.11)
RDW: 12.3 % (ref 11.5–15.5)
RDW: 12.4 % (ref 11.5–15.5)
WBC: 13.3 10*3/uL — ABNORMAL HIGH (ref 4.0–10.5)
WBC: 13.6 10*3/uL — ABNORMAL HIGH (ref 4.0–10.5)
nRBC: 0 % (ref 0.0–0.2)
nRBC: 0 % (ref 0.0–0.2)

## 2022-08-25 LAB — COMPREHENSIVE METABOLIC PANEL
ALT: 13 U/L (ref 0–44)
AST: 22 U/L (ref 15–41)
Albumin: 2.3 g/dL — ABNORMAL LOW (ref 3.5–5.0)
Alkaline Phosphatase: 74 U/L (ref 38–126)
Anion gap: 11 (ref 5–15)
BUN: <5 mg/dL — ABNORMAL LOW (ref 6–20)
CO2: 19 mmol/L — ABNORMAL LOW (ref 22–32)
Calcium: 8.5 mg/dL — ABNORMAL LOW (ref 8.9–10.3)
Chloride: 103 mmol/L (ref 98–111)
Creatinine, Ser: 0.52 mg/dL (ref 0.44–1.00)
GFR, Estimated: >60 mL/min (ref >60)
Glucose, Bld: 111 mg/dL — ABNORMAL HIGH (ref 70–99)
Potassium: 3.7 mmol/L (ref 3.5–5.1)
Sodium: 133 mmol/L — ABNORMAL LOW (ref 135–145)
Total Bilirubin: 0.2 mg/dL — ABNORMAL LOW (ref 0.3–1.2)
Total Protein: 5.9 g/dL — ABNORMAL LOW (ref 6.5–8.1)

## 2022-08-25 LAB — RESP PANEL BY RT-PCR (RSV, FLU A&B, COVID)  RVPGX2
Influenza A by PCR: NEGATIVE
Influenza B by PCR: NEGATIVE
Resp Syncytial Virus by PCR: NEGATIVE
SARS Coronavirus 2 by RT PCR: NEGATIVE

## 2022-08-25 LAB — TROPONIN I (HIGH SENSITIVITY)
Troponin I (High Sensitivity): 4 ng/L (ref <18)
Troponin I (High Sensitivity): 5 ng/L (ref <18)

## 2022-08-25 LAB — BRAIN NATRIURETIC PEPTIDE: B Natriuretic Peptide: 120.5 pg/mL — ABNORMAL HIGH (ref 0.0–100.0)

## 2022-08-25 MED ORDER — LACTATED RINGERS IV SOLN: INTRAVENOUS | Status: DC

## 2022-08-25 MED ORDER — PROCHLORPERAZINE MALEATE 10 MG PO TABS
10.0000 mg | ORAL_TABLET | Freq: Four times a day (QID) | ORAL | Status: DC | PRN
  Administered 2022-08-25 – 2022-08-27 (×2): 10 mg via ORAL
  Filled 2022-08-25 (×4): qty 1

## 2022-08-25 MED ORDER — HYDROXYZINE HCL 50 MG PO TABS
25.0000 mg | ORAL_TABLET | Freq: Three times a day (TID) | ORAL | Status: DC | PRN
  Administered 2022-08-25 – 2022-08-26 (×3): 25 mg via ORAL
  Filled 2022-08-25 (×3): qty 1

## 2022-08-25 MED ORDER — ACETAMINOPHEN 500 MG PO TABS
1000.0000 mg | ORAL_TABLET | Freq: Three times a day (TID) | ORAL | Status: DC | PRN
  Administered 2022-08-25 – 2022-08-28 (×2): 1000 mg via ORAL
  Filled 2022-08-25 (×2): qty 2

## 2022-08-25 MED ORDER — PROGESTERONE 200 MG PO CAPS
200.0000 mg | ORAL_CAPSULE | Freq: Every day | ORAL | Status: DC
  Administered 2022-08-25 – 2022-08-28 (×4): 200 mg via VAGINAL
  Filled 2022-08-25 (×4): qty 1

## 2022-08-25 MED ORDER — MOMETASONE FURO-FORMOTEROL FUM 200-5 MCG/ACT IN AERO
2.0000 | INHALATION_SPRAY | Freq: Two times a day (BID) | RESPIRATORY_TRACT | Status: DC
  Administered 2022-08-26 – 2022-08-29 (×7): 2 via RESPIRATORY_TRACT
  Filled 2022-08-25: qty 8.8

## 2022-08-25 MED ORDER — HYDROMORPHONE HCL 1 MG/ML IJ SOLN
1.0000 mg | Freq: Once | INTRAMUSCULAR | Status: AC
  Administered 2022-08-25: 1 mg via INTRAVENOUS
  Filled 2022-08-25: qty 1

## 2022-08-25 NOTE — Progress Notes (Signed)
OB Update Note Troponin negative x 2. BNP elevated at 120, CBC stable with Hgb not as low as before, cmp stable. Patient too uncomfortable for formal CXR so pCXR done and borderline cardiomegaly or should consider pericardial effusion. EKG read pending. MFM u/s shows no e/o bleeding but AFI for A is lower than yesterday (2.4 vs 3.5)   Patient states she feels about the same as when I first saw her this afternoon.   NAD Patient Vitals for the past 24 hrs:  BP Temp Temp src Pulse Resp SpO2 Height Weight  08/25/22 1624 (!) 111/50 99.7 F (37.6 C) Oral (!) 130 18 97 % -- --  08/25/22 1412 (!) 98/46 98.3 F (36.8 C) Oral (!) 118 16 98 % -- --  08/25/22 1314 118/61 98.2 F (36.8 C) Oral (!) 125 16 98 % -- --  08/25/22 1238 (!) 101/49 98.1 F (36.7 C) Oral (!) 115 16 98 % -- --  08/25/22 0800 (!) 80/41 98.4 F (36.9 C) Oral 99 16 96 % -- --  08/25/22 0520 (!) 95/54 98.4 F (36.9 C) Oral (!) 101 17 94 % -- --  08/25/22 0111 -- -- -- -- -- -- 5' 2"$  (1.575 m) 90.7 kg  08/24/22 2310 113/68 98.5 F (36.9 C) Oral (!) 121 18 97 % -- --  08/24/22 2100 116/63 98.6 F (37 C) Oral (!) 129 20 95 % -- --   NAD, patient laying on her side CTAB, no respiratory distress Normal s1 and s2, no MRGs. HR 110s-120s   A/P: pt stable I told her and her partner that it would be unlikely, since she had a normal maternal echo at [place] on 2/9, but she also had a normal CXR there, as well, so I recommend a repeat maternal echo. Patient asks for it to be tomorrow and this is reasonable as long as she stays stable. I also told her I recommend cbc and BNP in the morning and plan for maternal echo in the morning, as well.   I told her that our main concern has always been to make sure she doesn't have an intrauterine infection and then to go from re: any s/s that she has. She states she was under a lot of blankets and the room was hot at her last temp. Will continue to follow her closely   Durene Romans  MD Attending [facility] [place] (Faculty Practice) 08/25/2022 Time: [phone]

## 2022-08-25 NOTE — Plan of Care (Signed)
  Problem: Education: Goal: Knowledge of disease or condition will improve Outcome: Progressing Goal: Knowledge of the prescribed therapeutic regimen will improve Outcome: Progressing Goal: Individualized Educational Video(s) Outcome: Progressing   Problem: Clinical Measurements: Goal: Complications related to the disease process, condition or treatment will be avoided or minimized Outcome: Progressing   Problem: Education: Goal: Knowledge of General Education information will improve Description: Including pain rating scale, medication(s)/side effects and non-pharmacologic comfort measures Outcome: Progressing   Problem: Health Behavior/Discharge Planning: Goal: Ability to manage health-related needs will improve Outcome: Progressing   Problem: Clinical Measurements: Goal: Ability to maintain clinical measurements within normal limits will improve Outcome: Progressing Goal: Will remain free from infection Outcome: Progressing Goal: Diagnostic test results will improve Outcome: Progressing Goal: Respiratory complications will improve Outcome: Progressing Goal: Cardiovascular complication will be avoided Outcome: Progressing   Problem: Activity: Goal: Risk for activity intolerance will decrease Outcome: Progressing   Problem: Nutrition: Goal: Adequate nutrition will be maintained Outcome: Progressing   Problem: Coping: Goal: Level of anxiety will decrease Outcome: Progressing   Problem: Elimination: Goal: Will not experience complications related to bowel motility Outcome: Progressing Goal: Will not experience complications related to urinary retention Outcome: Progressing   Problem: Pain Managment: Goal: General experience of comfort will improve Outcome: Progressing   Problem: Safety: Goal: Ability to remain free from injury will improve Outcome: Progressing   Problem: Skin Integrity: Goal: Risk for impaired skin integrity will decrease Outcome:  Progressing

## 2022-08-25 NOTE — Progress Notes (Signed)
CSW acknowledges social work consult placed to offer emotional and mental support. CSW attempted to meet with pt at bedside; however, patient expressed that it was not a good time and requested CSW return at a later time. CSW to follow up.   Signed,  Berniece Salines, MSW, [place], [place] 08/25/2022 3:39 PM

## 2022-08-25 NOTE — Progress Notes (Addendum)
Patient ID: Carol Paul, female   DOB: [birth year], 27 y.o.   MRN: [id number] FACULTY PRACTICE ANTEPARTUM(COMPREHENSIVE) NOTE  Carol Paul is a 27 y.o. G1P0000 at 26w1dby early ultrasound who is admitted for vaginal bleeding and pain.   Fetal presentation is unsure. Length of Stay:  0  Days  Subjective: Cramps and bleeding Patient reports the fetal movement as active. Patient reports uterine contraction  activity as irregular . Patient reports  vaginal bleeding as less flow than a normal period. Patient describes fluid per vagina as None.  Vitals:  Blood pressure (!) 80/41, pulse 99, temperature 98.4 F (36.9 C), temperature source Oral, resp. rate 16, height 5' 2"$  (1.575 m), weight 90.7 kg, SpO2 96 %. Physical Examination:  General appearance - alert, well appearing, and in no distress Heart - normal rate and regular rhythm Abdomen - soft, nontender, nondistended Fundal Height:  size equals dates Cervical Exam: Not evaluated. and found to be not evaluated// Extremities: extremities normal, atraumatic, no cyanosis or edema and Homans sign is negative, no sign of DVT  Membranes:intact  Fetal Monitoring:   170, 175  Labs:  Results for orders placed or performed during the hospital encounter of 08/24/22 (from the past 24 hour(s))  CBC   Collection Time: 08/24/22  5:56 PM  Result Value Ref Range   WBC 20.6 (H) 4.0 - 10.5 K/uL   RBC 3.21 (L) 3.87 - 5.11 MIL/uL   Hemoglobin 10.1 (L) 12.0 - 15.0 g/dL   HCT 29.9 (L) 36.0 - 46.0 %   MCV 93.1 80.0 - 100.0 fL   MCH 31.5 26.0 - 34.0 pg   MCHC 33.8 30.0 - 36.0 g/dL   RDW 12.3 11.5 - 15.5 %   Platelets 396 150 - 400 K/uL   nRBC 0.0 0.0 - 0.2 %  Type and screen [place]  Collection Time: 08/24/22 11:24 PM  Result Value Ref Range   ABO/RH(D) A POS    Antibody Screen NEG    Sample Expiration      08/27/2022,2359 [facility] [address], [place] [id number]      Medications:  Scheduled  docusate sodium  100 mg Oral Daily   prenatal multivitamin  1 tablet Oral Q1200   I have reviewed the patient's current medications.  ASSESSMENT: Patient Active Problem List   Diagnosis Date Noted   Vaginal bleeding in pregnancy, second trimester 08/24/2022   Twin pregnancy following selective fetal reduction in second trimester 08/21/2022   Anxiety 08/21/2022   [redacted] weeks gestation of pregnancy 08/21/2022   Vaginal bleeding during pregnancy 08/20/2022   MDD (major depressive disorder) 11/26/2016    PLAN: Observe for increased pain or bleeding  JEmeterio Reeve2/18/2024,8:04 AM   I also evaluated the patient around 9:30am today. We had a detailed conversation about her current status. We reviewed the following: Possibility of a subclinical infection causing or secondary to threatened pregnancy loss - WBC 20.6 on admission. She has no fundal tenderness on exam, but continues to have a leukocytosis, abdominal cramping and vaginal bleeding. Denies any heavy bleeding at this time. We discussed the unknown at this point - it is unclear how much latency she will achieve/if she will remain pregnant to 22-23 weeks and beyond. We discussed that most interventions at this point are unlikely to be helpful. I would NOT recommend serial cervical length measurements at this point, especially since I would not recommend cerclage with her symptoms.  Discussed limited utility for continued IV fluids, but pt strongly prefers to continue fluids. She would also like to continue vaginal progesterone - I discussed theoretical risk of infection and limited proven benefit, but it is still reasonable to continue so she would like to continue. We also discussed that bed rest has NOT been shown to effectively reduce risk of PTB but increases her risk for VTE and deconditioning. Encouraged patient to ambulate, eat normally, etc.  We discussed possible transfer to [facility], but she would  strongly prefer to stay in [place] so her partner can visit more easily. She feels like they have counseled her well, but since she is not near 22 weeks there don't seem to be any interventions they could offer her that we can't do here.  Carol Paul strongly desires to avoid another delivery at home. Given that she may remain pregnant for several days to weeks, we discussed that discharge home would be reasonable. She does not currently have any evidence of chorioamnionitis and her pain control/bleeding is improving. We discussed continued monitoring overnight, but likely discharge home tomorrow if she remains stable.  Bergen asked if there was a possibility that her babies would be born alive before 72 weeks. I explained that babies prior to 22 weeks can be born with a heart beat, may move and live for hours. Discussed comfort care for the babies would include wrapping in blankets or skin-to-skin care to provide warmth and comfort.  We also discussed best location of care moving forward. She would like to deliver at [facility]. Reviewed that our NICU begins resuscitation at 23 weeks and [facility] begins at 22 weeks. Reviewed that most institutions that resuscitate at 22 weeks will start [place] at [redacted]w[redacted]d so she should present there for care (if stable & safe) if she is concerned about preterm delivery between [id number].  We also reviewed her mental well being. She expresses that she feels like "torture" and she feels guilty about what is happening. She wonders if the selective reduction was the cause and if she made things worse with the procedure. I reviewed that we have no way of knowing what her outcome would have been without the reduction, and that she could have had PTL/PTB regardless. Provided emotional support. Offered SW/chaplain eval and she is accepting of SW eval. I validated her feelings and expressed my support. I encouraged her to utilize coping mechanisms that have worked well for her in the past.   At  the end of our conversation, patient expressed her appreciation and denied any further questions. She is agreeable to our plan as it stands.   KyGale JourneyMD [facility] WoDe[facility] CompanyCo[place]

## 2022-08-25 NOTE — Progress Notes (Signed)
OB Note CTSP for headache, chest discomfort/heaviness, blurry vision and feeling lightheaded  Patient states s/s began this morning. She has a h/o asthma but states the chest s/s feel different. CBC ordered and showed a drop in her hemoglobin but also a drop in all her other lab values.  Stable cramping and slight VB but nothing changed or heavy.    Current Vital Signs 24h Vital Sign Ranges  T 98.3 F (36.8 C) Temp  Avg: 98.3 F (36.8 C)  Min: 98.1 F (36.7 C)  Max: 98.6 F (37 C)  BP (!) 98/46 BP  Min: 80/41  Max: 121/78  HR (!) 118 Pulse  Avg: 117.8  Min: 99  Max: 134  RR 16 Resp  Avg: 17.4  Min: 16  Max: 20  SaO2 98 % Room Air SpO2  Avg: 96.4 %  Min: 94 %  Max: 98 %       24 Hour I/O Current Shift I/O  Time Ins Outs 02/17 0701 - 02/18 0700 In: 1628.5 [P.O.:780; I.V.:848.5] Out: 1200 [Urine:1200] 02/18 0701 - 02/18 1900 In: 1009.2 [P.O.:85; I.V.:924.2] Out: 0    Patient Vitals for the past 12 hrs:  BP Temp Temp src Pulse Resp SpO2  08/25/22 1412 (!) 98/46 98.3 F (36.8 C) Oral (!) 118 16 98 %  08/25/22 1314 118/61 98.2 F (36.8 C) Oral (!) 125 16 98 %  08/25/22 1238 (!) 101/49 98.1 F (36.7 C) Oral (!) 115 16 98 %  08/25/22 0800 (!) 80/41 98.4 F (36.9 C) Oral 99 16 96 %  08/25/22 0520 (!) 95/54 98.4 F (36.9 C) Oral (!) 101 17 94 %   General: NAD, laying her side CV: normal s1 and s2, no MRGs. HR 110s Pulmonary: no distress. CTAB     Latest Ref Rng & Units 08/25/2022    1:40 PM 08/24/2022    5:56 PM 08/20/2022    6:22 PM  CBC  WBC 4.0 - 10.5 K/uL 13.3  20.6  23.2   Hemoglobin 12.0 - 15.0 g/dL 8.4  10.1  10.5   Hematocrit 36.0 - 46.0 % 24.3  29.9  30.7   Platelets 150 - 400 K/uL 272  396  352     A/P: Patient stable. I told her I'm skeptical of the lab draw that it could be dilutional. D/c IVF recommended this morning but patient wanted to maintain them. She was amenable to stopping them for 20 minutes and then doing labwork again and I told her I would also do  a cmp, troponin and BNP; RN to have labs drawn after about 15-20 of the MIVF being off. I also told her I recommend an EKG and CXR and potentially a CT scan to evaluate for PE; pt is hesistant for CT scan b/c she states that she's had multiple in the past for chest s/s at [facility] and they have been normal. I told her that if the labs confirm anemia, I would recommend a unit of blood which she is amenable to but I also recommend a repeat ultrasound since her VB is minimal so an u/s would make sure there is nothing internal like an abruption; she was amenable to this  Durene Romans MD Attending Center for [employer] (Faculty Practice) 08/25/2022 Time: [id number]

## 2022-08-26 ENCOUNTER — Inpatient Hospital Stay (HOSPITAL_COMMUNITY): Payer: Medicaid Other

## 2022-08-26 DIAGNOSIS — Z3A17 17 weeks gestation of pregnancy: Secondary | ICD-10-CM | POA: Diagnosis not present

## 2022-08-26 DIAGNOSIS — O30042 Twin pregnancy, dichorionic/diamniotic, second trimester: Secondary | ICD-10-CM | POA: Diagnosis not present

## 2022-08-26 DIAGNOSIS — O4692 Antepartum hemorrhage, unspecified, second trimester: Secondary | ICD-10-CM

## 2022-08-26 DIAGNOSIS — I3139 Other pericardial effusion (noninflammatory): Secondary | ICD-10-CM

## 2022-08-26 LAB — CBC WITH DIFFERENTIAL/PLATELET
Abs Immature Granulocytes: 0.1 10*3/uL — ABNORMAL HIGH (ref 0.00–0.07)
Basophils Absolute: 0.1 10*3/uL (ref 0.0–0.1)
Basophils Relative: 1 %
Eosinophils Absolute: 0.1 10*3/uL (ref 0.0–0.5)
Eosinophils Relative: 1 %
HCT: 24.9 % — ABNORMAL LOW (ref 36.0–46.0)
Hemoglobin: 8.5 g/dL — ABNORMAL LOW (ref 12.0–15.0)
Lymphocytes Relative: 8 %
Lymphs Abs: 1.1 10*3/uL (ref 0.7–4.0)
MCH: 31.8 pg (ref 26.0–34.0)
MCHC: 34.1 g/dL (ref 30.0–36.0)
MCV: 93.3 fL (ref 80.0–100.0)
Metamyelocytes Relative: 1 %
Monocytes Absolute: 0.3 10*3/uL (ref 0.1–1.0)
Monocytes Relative: 2 %
Neutro Abs: 12.4 10*3/uL — ABNORMAL HIGH (ref 1.7–7.7)
Neutrophils Relative %: 87 %
Platelets: 242 10*3/uL (ref 150–400)
RBC: 2.67 MIL/uL — ABNORMAL LOW (ref 3.87–5.11)
RDW: 12.5 % (ref 11.5–15.5)
WBC: 14.2 10*3/uL — ABNORMAL HIGH (ref 4.0–10.5)
nRBC: 0 % (ref 0.0–0.2)
nRBC: 0 /100 WBC

## 2022-08-26 LAB — ECHOCARDIOGRAM COMPLETE
Height: 62 in
S' Lateral: 3.3 cm
Weight: 3199.32 oz

## 2022-08-26 LAB — CBC
HCT: 28.1 % — ABNORMAL LOW (ref 36.0–46.0)
Hemoglobin: 9.4 g/dL — ABNORMAL LOW (ref 12.0–15.0)
MCH: 31.2 pg (ref 26.0–34.0)
MCHC: 33.5 g/dL (ref 30.0–36.0)
MCV: 93.4 fL (ref 80.0–100.0)
Platelets: 321 10*3/uL (ref 150–400)
RBC: 3.01 MIL/uL — ABNORMAL LOW (ref 3.87–5.11)
RDW: 12.5 % (ref 11.5–15.5)
WBC: 14.9 10*3/uL — ABNORMAL HIGH (ref 4.0–10.5)
nRBC: 0 % (ref 0.0–0.2)

## 2022-08-26 LAB — PREPARE RBC (CROSSMATCH)

## 2022-08-26 LAB — BRAIN NATRIURETIC PEPTIDE: B Natriuretic Peptide: 115.1 pg/mL — ABNORMAL HIGH (ref 0.0–100.0)

## 2022-08-26 MED ORDER — SODIUM CHLORIDE 0.9% IV SOLUTION: Freq: Once | INTRAVENOUS | Status: AC

## 2022-08-26 MED ORDER — ACETAMINOPHEN 325 MG PO TABS
650.0000 mg | ORAL_TABLET | Freq: Once | ORAL | Status: AC
  Administered 2022-08-26: 650 mg via ORAL
  Filled 2022-08-26: qty 2

## 2022-08-26 MED ORDER — DIPHENHYDRAMINE HCL 25 MG PO CAPS
25.0000 mg | ORAL_CAPSULE | Freq: Once | ORAL | Status: AC
  Administered 2022-08-26: 25 mg via ORAL
  Filled 2022-08-26: qty 1

## 2022-08-26 MED ORDER — FERROUS SULFATE 325 (65 FE) MG PO TABS
325.0000 mg | ORAL_TABLET | ORAL | Status: DC
  Administered 2022-08-26 – 2022-08-28 (×2): 325 mg via ORAL
  Filled 2022-08-26 (×2): qty 1

## 2022-08-26 NOTE — Progress Notes (Signed)
Patient seen with partner at bedside. She denies any cramping or contractions but reports heavy vaginal bleeding, heavier than a normal period for her. Patient reports feeling better following blood transfusion  SSE: Active bleeding from cervical os which appears to be visually closed.  Doppler 145 and 153 this morning     Latest Ref Rng & Units 08/26/2022    4:24 PM 08/26/2022    5:19 AM 08/25/2022    3:05 PM  CBC  WBC 4.0 - 10.5 K/uL 14.9  14.2  13.6   Hemoglobin 12.0 - 15.0 g/dL 9.4  8.5  9.2   Hematocrit 36.0 - 46.0 % 28.1  24.9  28.4   Platelets 150 - 400 K/uL 321  242  62      A/P  27 yo G1P0 at 22w2dwith vaginal bleeding and di-di twin pregnancy - Expressed concerns regarding amount of on-going vaginal bleeding for threatened pregnancy loss - No evidence of chorioamnionitis. Elevated BP may be secondary to recent blood transfusion - Reviewed normal echo report - Consider discharge home tomorrow if patient clinical status remains unchanged - Patient verbalized understanding and is agreeable with plan

## 2022-08-26 NOTE — Progress Notes (Signed)
CSW attempted to meet with MOB; however, the curtain was pulled and MOB requested that CSW return at a later time.   CSW will attempt to meet with MOB at a later time.   Abundio Miu, [place] Worker [facility] Cell#: [phone]

## 2022-08-26 NOTE — Progress Notes (Signed)
  Echocardiogram 2D Echocardiogram has been performed.  Carol Paul 08/26/2022, 10:42 AM

## 2022-08-26 NOTE — Progress Notes (Signed)
Patient ID: Carol Paul, female   DOB: 15-Dec-1995, 27 y.o.   MRN: [id number] FACULTY PRACTICE ANTEPARTUM(COMPREHENSIVE) NOTE  Carol Paul is a 27 y.o. G1P0000 at 80w2dby early ultrasound who is admitted for vaginal bleeding and pain.   Fetal presentation is unsure. Length of Stay:  1  Days  Subjective: Patient reports continued vaginal bleeding overnight, now bright red with passage of a clot. She reports some continued cramping unchanged from yesterday. She admits to feeling tired. Patient denies any chest pain or difficulty breathing Patient reports the fetal movement as active. Patient describes fluid per vagina as None.  Vitals:  Blood pressure 104/64, pulse (!) 113, temperature 97.8 F (36.6 C), temperature source Oral, resp. rate 17, height 5' 2"$  (1.575 m), weight 90.7 kg, SpO2 98 %. Physical Examination:  General appearance - alert, well appearing, and in no distress Heart - normal rate and regular rhythm Abdomen - soft, nontender, nondistended Cervical Exam: Not evaluated.  Extremities: extremities normal, atraumatic, no cyanosis or edema and Homans sign is negative, no sign of DVT  Membranes:intact  Fetal Monitoring:   159, 171 last night. Patient deferred doppler for later today  Labs:  Results for orders placed or performed during the hospital encounter of 08/24/22 (from the past 24 hour(s))  CBC with Differential/Platelet   Collection Time: 08/25/22  1:40 PM  Result Value Ref Range   WBC 13.3 (H) 4.0 - 10.5 K/uL   RBC 2.61 (L) 3.87 - 5.11 MIL/uL   Hemoglobin 8.4 (L) 12.0 - 15.0 g/dL   HCT 24.3 (L) 36.0 - 46.0 %   MCV 93.1 80.0 - 100.0 fL   MCH 32.2 26.0 - 34.0 pg   MCHC 34.6 30.0 - 36.0 g/dL   RDW 12.3 11.5 - 15.5 %   Platelets 272 150 - 400 K/uL   nRBC 0.0 0.0 - 0.2 %   Neutrophils Relative % 84 %   Neutro Abs 11.3 (H) 1.7 - 7.7 K/uL   Lymphocytes Relative 8 %   Lymphs Abs 1.0 0.7 - 4.0 K/uL   Monocytes Relative 3 %   Monocytes Absolute 0.4 0.1 - 1.0  K/uL   Eosinophils Relative 2 %   Eosinophils Absolute 0.2 0.0 - 0.5 K/uL   Basophils Relative 0 %   Basophils Absolute 0.0 0.0 - 0.1 K/uL   Immature Granulocytes 3 %   Abs Immature Granulocytes 0.37 (H) 0.00 - 0.07 K/uL  Comprehensive metabolic panel   Collection Time: 08/25/22  3:05 PM  Result Value Ref Range   Sodium 133 (L) 135 - 145 mmol/L   Potassium 3.7 3.5 - 5.1 mmol/L   Chloride 103 98 - 111 mmol/L   CO2 19 (L) 22 - 32 mmol/L   Glucose, Bld 111 (H) 70 - 99 mg/dL   BUN <5 (L) 6 - 20 mg/dL   Creatinine, Ser 0.52 0.44 - 1.00 mg/dL   Calcium 8.5 (L) 8.9 - 10.3 mg/dL   Total Protein 5.9 (L) 6.5 - 8.1 g/dL   Albumin 2.3 (L) 3.5 - 5.0 g/dL   AST 22 15 - 41 U/L   ALT 13 0 - 44 U/L   Alkaline Phosphatase 74 38 - 126 U/L   Total Bilirubin 0.2 (L) 0.3 - 1.2 mg/dL   GFR, Estimated >60 >60 mL/min   Anion gap 11 5 - 15  Brain natriuretic peptide   Collection Time: 08/25/22  3:05 PM  Result Value Ref Range   B Natriuretic Peptide 120.5 (H) 0.0 -  100.0 pg/mL  CBC with Differential/Platelet   Collection Time: 08/25/22  3:05 PM  Result Value Ref Range   WBC 13.6 (H) 4.0 - 10.5 K/uL   RBC 2.94 (L) 3.87 - 5.11 MIL/uL   Hemoglobin 9.2 (L) 12.0 - 15.0 g/dL   HCT 28.4 (L) 36.0 - 46.0 %   MCV 96.6 80.0 - 100.0 fL   MCH 31.3 26.0 - 34.0 pg   MCHC 32.4 30.0 - 36.0 g/dL   RDW 12.4 11.5 - 15.5 %   Platelets 308 150 - 400 K/uL   nRBC 0.0 0.0 - 0.2 %   Neutrophils Relative % 83 %   Neutro Abs 11.3 (H) 1.7 - 7.7 K/uL   Lymphocytes Relative 8 %   Lymphs Abs 1.1 0.7 - 4.0 K/uL   Monocytes Relative 3 %   Monocytes Absolute 0.5 0.1 - 1.0 K/uL   Eosinophils Relative 1 %   Eosinophils Absolute 0.2 0.0 - 0.5 K/uL   Basophils Relative 1 %   Basophils Absolute 0.1 0.0 - 0.1 K/uL   Immature Granulocytes 4 %   Abs Immature Granulocytes 0.52 (H) 0.00 - 0.07 K/uL  Troponin I (High Sensitivity)   Collection Time: 08/25/22  3:05 PM  Result Value Ref Range   Troponin I (High Sensitivity) 4 <18  ng/L  Troponin I (High Sensitivity)   Collection Time: 08/25/22  5:12 PM  Result Value Ref Range   Troponin I (High Sensitivity) 5 <18 ng/L  Resp panel by RT-PCR (RSV, Flu A&B, Covid) Anterior Nasal Swab   Collection Time: 08/25/22  7:35 PM   Specimen: Anterior Nasal Swab  Result Value Ref Range   SARS Coronavirus 2 by RT PCR NEGATIVE NEGATIVE   Influenza A by PCR NEGATIVE NEGATIVE   Influenza B by PCR NEGATIVE NEGATIVE   Resp Syncytial Virus by PCR NEGATIVE NEGATIVE  CBC with Differential/Platelet   Collection Time: 08/26/22  5:19 AM  Result Value Ref Range   WBC 14.2 (H) 4.0 - 10.5 K/uL   RBC 2.67 (L) 3.87 - 5.11 MIL/uL   Hemoglobin 8.5 (L) 12.0 - 15.0 g/dL   HCT 24.9 (L) 36.0 - 46.0 %   MCV 93.3 80.0 - 100.0 fL   MCH 31.8 26.0 - 34.0 pg   MCHC 34.1 30.0 - 36.0 g/dL   RDW 12.5 11.5 - 15.5 %   Platelets 242 150 - 400 K/uL   nRBC 0.0 0.0 - 0.2 %   Neutrophils Relative % 87 %   Neutro Abs 12.4 (H) 1.7 - 7.7 K/uL   Lymphocytes Relative 8 %   Lymphs Abs 1.1 0.7 - 4.0 K/uL   Monocytes Relative 2 %   Monocytes Absolute 0.3 0.1 - 1.0 K/uL   Eosinophils Relative 1 %   Eosinophils Absolute 0.1 0.0 - 0.5 K/uL   Basophils Relative 1 %   Basophils Absolute 0.1 0.0 - 0.1 K/uL   nRBC 0 0 /100 WBC   Metamyelocytes Relative 1 %   Abs Immature Granulocytes 0.10 (H) 0.00 - 0.07 K/uL  Brain natriuretic peptide   Collection Time: 08/26/22  5:19 AM  Result Value Ref Range   B Natriuretic Peptide 115.1 (H) 0.0 - 100.0 pg/mL  Prepare RBC (crossmatch)   Collection Time: 08/26/22  9:40 AM  Result Value Ref Range   Order Confirmation      ORDER PROCESSED BY BLOOD BANK Performed at [facility], [address] [place], [place] [address]   ECHOCARDIOGRAM COMPLETE   Collection Time:  08/26/22  9:46 AM  Result Value Ref Range   Weight 3,199.32 oz   Height 62 in   BP 104/64 mmHg     Medications:  Scheduled  sodium chloride   Intravenous Once   acetaminophen  650 mg Oral Once    diphenhydrAMINE  25 mg Oral Once   docusate sodium  100 mg Oral Daily   mometasone-formoterol  2 puff Inhalation BID   prenatal multivitamin  1 tablet Oral Q1200   progesterone  200 mg Vaginal QHS   I have reviewed the patient's current medications.  ASSESSMENT: Patient Active Problem List   Diagnosis Date Noted   Twin pregnancy 08/25/2022   Vaginal bleeding in pregnancy, second trimester 08/24/2022   Twin pregnancy following selective fetal reduction in second trimester 08/21/2022   Anxiety 08/21/2022   [redacted] weeks gestation of pregnancy 08/21/2022   Vaginal bleeding during pregnancy 08/20/2022   MDD (major depressive disorder) 11/26/2016    PLAN: 27 yo P0 at 24w2dwith triplet pregnancy recently reduced to di-di twin pregnancy admitted with concerns for chorioamnionitis.   With on going vaginal bleeding, expressed concerns for subclinical infection or threatened pregnancy loss - WBC stable at 13 over the past few days.   Discussed anemia with ongoing vaginal bleeding. Patient agrees to receiving 1 unit pRBC  Follow up maternal echo this morning in light of questionable pericardial effusion  4.  Patient is agreeable to discharge home if all exam findings remain stable later today or tomorrow    PMora Bellman MD [place] FClearwaterfor WDean Foods Company C[place]

## 2022-08-27 LAB — CBC
HCT: 27.2 % — ABNORMAL LOW (ref 36.0–46.0)
Hemoglobin: 9 g/dL — ABNORMAL LOW (ref 12.0–15.0)
MCH: 31.4 pg (ref 26.0–34.0)
MCHC: 33.1 g/dL (ref 30.0–36.0)
MCV: 94.8 fL (ref 80.0–100.0)
Platelets: 291 10*3/uL (ref 150–400)
RBC: 2.87 MIL/uL — ABNORMAL LOW (ref 3.87–5.11)
RDW: 12.7 % (ref 11.5–15.5)
WBC: 13.9 10*3/uL — ABNORMAL HIGH (ref 4.0–10.5)
nRBC: 0 % (ref 0.0–0.2)

## 2022-08-27 LAB — BRAIN NATRIURETIC PEPTIDE: B Natriuretic Peptide: 67 pg/mL (ref 0.0–100.0)

## 2022-08-27 MED ORDER — FENTANYL CITRATE (PF) 100 MCG/2ML IJ SOLN
100.0000 ug | Freq: Once | INTRAMUSCULAR | Status: AC
  Administered 2022-08-27: 100 ug via INTRAVENOUS
  Filled 2022-08-27: qty 2

## 2022-08-27 MED ORDER — KETOROLAC TROMETHAMINE 30 MG/ML IJ SOLN
30.0000 mg | Freq: Four times a day (QID) | INTRAMUSCULAR | Status: DC
  Administered 2022-08-27 – 2022-08-29 (×7): 30 mg via INTRAVENOUS
  Filled 2022-08-27 (×7): qty 1

## 2022-08-27 MED ORDER — FENTANYL CITRATE (PF) 100 MCG/2ML IJ SOLN
100.0000 ug | INTRAMUSCULAR | Status: DC | PRN
  Administered 2022-08-27 – 2022-08-28 (×5): 100 ug via INTRAVENOUS
  Filled 2022-08-27 (×5): qty 2

## 2022-08-27 NOTE — Progress Notes (Signed)
Patient ID: Carol Paul, female   DOB: 06-12-1996, 27 y.o.   MRN: [id number]   Assessment/Plan: Principal Problem:   Vaginal bleeding in pregnancy, second trimester Active Problems:   Twin pregnancy  Increased bleeding and cramping today, concern for impending delivery. She is improved now with fentanyl and toradol. Monitor bleeding Check CBC Patient and partner asked about Magnesium. I am not comfortable with aggressive tocolysis in this setting of possible abruptio/possible indolent intrauterine infection.  Subjective: Interval History:increased bleeding and cramping this am. Required fentanyl and toradol.  Objective: Vital signs in last 24 hours: Temp:  [97.5 F (36.4 C)-98.4 F (36.9 C)] 98.3 F (36.8 C) (02/20 0444) Pulse Rate:  [99-118] 99 (02/20 0444) Resp:  [17-20] 20 (02/20 0444) BP: (97-116)/(58-81) 97/58 (02/20 0444) SpO2:  [96 %-100 %] 100 % (02/20 0444)  Intake/Output from previous day: 02/19 0701 - 02/20 0700 In: 565 [P.O.:240] Out: 700 [Urine:700] Intake/Output this shift: No intake/output data recorded.  General appearance: alert, cooperative, and appears stated age Head: Normocephalic, without obvious abnormality, atraumatic Lungs: normal effort Heart: regular rate and rhythm Extremities: extremities normal, atraumatic, no cyanosis or edema Skin: Skin color, texture, turgor normal. No rashes or lesions  Results for orders placed or performed during the hospital encounter of 08/24/22 (from the past 24 hour(s))  Prepare RBC (crossmatch)     Status: None   Collection Time: 08/26/22  9:40 AM  Result Value Ref Range   Order Confirmation      ORDER PROCESSED BY BLOOD BANK Performed at [facility], [address] [place], [place] [address]   CBC     Status: Abnormal   Collection Time: 08/26/22  4:24 PM  Result Value Ref Range   WBC 14.9 (H) 4.0 - 10.5 K/uL   RBC 3.01 (L) 3.87 - 5.11 MIL/uL   Hemoglobin 9.4 (L) 12.0 - 15.0 g/dL   HCT 28.1  (L) 36.0 - 46.0 %   MCV 93.4 80.0 - 100.0 fL   MCH 31.2 26.0 - 34.0 pg   MCHC 33.5 30.0 - 36.0 g/dL   RDW 12.5 11.5 - 15.5 %   Platelets 321 150 - 400 K/uL   nRBC 0.0 0.0 - 0.2 %    Studies/Results: ECHOCARDIOGRAM COMPLETE  Result Date: 08/26/2022    ECHOCARDIOGRAM REPORT   Patient Name:   MARLIESE HOLSCHER Date of Exam: 08/26/2022 Medical Rec #:  [id number]        Height:       62.0 in Accession #:    [id number]       Weight:       200.0 lb Date of Birth:  May 26, 1996        BSA:          1.912 m Patient Age:    26 years         BP:           104/64 mmHg Patient Gender: F                HR:           115 bpm. Exam Location:  Inpatient Procedure: 2D Echo, Color Doppler and Cardiac Doppler Indications:    pericardial effusion  History:        Patient has no prior history of Echocardiogram examinations.                 Signs/Symptoms:pregnant.  Sonographer:    Johny Chess RDCS Referring Phys: [id number] [place]  1. Left ventricular ejection fraction, by estimation, is 60 to 65%. The left ventricle has normal function. The left ventricle has no regional wall motion abnormalities. Indeterminate diastolic filling due to E-A fusion.  2. Right ventricular systolic function is normal. The right ventricular size is normal. Tricuspid regurgitation signal is inadequate for assessing PA pressure.  3. The mitral valve is normal in structure. No evidence of mitral valve regurgitation. No evidence of mitral stenosis.  4. The aortic valve is tricuspid. Aortic valve regurgitation is not visualized. No aortic stenosis is present.  5. The inferior vena cava is normal in size with greater than 50% respiratory variability, suggesting right atrial pressure of 3 mmHg.  6. No pericardial effusion. FINDINGS  Left Ventricle: Left ventricular ejection fraction, by estimation, is 60 to 65%. The left ventricle has normal function. The left ventricle has no regional wall motion abnormalities. The left ventricular  internal cavity size was normal in size. There is  no left ventricular hypertrophy. Indeterminate diastolic filling due to E-A fusion. Right Ventricle: The right ventricular size is normal. No increase in right ventricular wall thickness. Right ventricular systolic function is normal. Tricuspid regurgitation signal is inadequate for assessing PA pressure. Left Atrium: Left atrial size was normal in size. Right Atrium: Right atrial size was normal in size. Pericardium: There is no evidence of pericardial effusion. Mitral Valve: The mitral valve is normal in structure. No evidence of mitral valve regurgitation. No evidence of mitral valve stenosis. Tricuspid Valve: The tricuspid valve is normal in structure. Tricuspid valve regurgitation is trivial. Aortic Valve: The aortic valve is tricuspid. Aortic valve regurgitation is not visualized. No aortic stenosis is present. Pulmonic Valve: The pulmonic valve was normal in structure. Pulmonic valve regurgitation is not visualized. Aorta: The aortic root is normal in size and structure. Venous: The inferior vena cava is normal in size with greater than 50% respiratory variability, suggesting right atrial pressure of 3 mmHg. IAS/Shunts: No atrial level shunt detected by color flow Doppler.  LEFT VENTRICLE PLAX 2D LVIDd:         5.40 cm LVIDs:         3.30 cm LV PW:         0.80 cm LV IVS:        0.60 cm LVOT diam:     1.90 cm LV SV:         51 LV SV Index:   27 LVOT Area:     2.84 cm  RIGHT VENTRICLE             IVC RV Basal diam:  2.80 cm     IVC diam: 1.50 cm RV S prime:     12.20 cm/s TAPSE (M-mode): 2.7 cm LEFT ATRIUM             Index        RIGHT ATRIUM           Index LA diam:        3.90 cm 2.04 cm/m   RA Area:     13.70 cm LA Vol (A2C):   35.8 ml 18.73 ml/m  RA Volume:   35.30 ml  18.47 ml/m LA Vol (A4C):   33.7 ml 17.63 ml/m LA Biplane Vol: 35.3 ml 18.47 ml/m  AORTIC VALVE LVOT Vmax:   131.00 cm/s LVOT Vmean:  85.000 cm/s LVOT VTI:    0.181 m  AORTA Ao Root  diam: 2.50 cm Ao Asc diam:  2.80 cm MV E velocity: 94.50  cm/s MV A velocity: 96.60 cm/s  SHUNTS MV E/A ratio:  0.98        Systemic VTI:  0.18 m                            Systemic Diam: 1.90 cm Dalton McleanMD Electronically signed by Franki Monte Signature Date/Time: 08/26/2022/3:47:55 PM    Final    [place] MFM OB LIMITED  Result Date: 08/25/2022 ----------------------------------------------------------------------  OBSTETRICS REPORT                        (Signed Final 08/25/2022 06:02 pm) ---------------------------------------------------------------------- Patient Info  ID #:       [id number]                          D.O.B.:  [birth year] (26 yrs)  Name:       Carol Paul                Visit Date: 08/25/2022 05:13 pm ---------------------------------------------------------------------- Performed By  Attending:        Johnell Comings MD         Ref. Address:      [address]                                                              [place], [place]  Performed By:     Lesleigh Noe Summer        Secondary Phy.:    [facility]                    RDMS                                                              Care  Referred By:      Eduard Clos                [facility]                    [place] ---------------------------------------------------------------------- Orders  #  Description                           Code        Ordered By  1  [place] MFM OB LIMITED  [id number]    CHARLIE PICKENS ----------------------------------------------------------------------  #  Order #                     Accession #                Episode #  1  [id number]                   [id number]                 [id number] ---------------------------------------------------------------------- Indications  Twin pregnancy following elective fetal         O30.003, O31.30X0  reduction,  antepartum  Twin pregnancy, di/di, second trimester         O30.042  [redacted] weeks gestation of pregnancy                 Z3A.17 ---------------------------------------------------------------------- Fetal Evaluation (Fetus A)  Num Of Fetuses:          2  Fetal Heart Rate(bpm):   187  Cardiac Activity:        Observed  Presentation:            Variable  Placenta:                Right lateral  Membrane Desc:      Dividing Membrane seen  Amniotic Fluid  AFI FV:      Subjectively low-normal                              Largest Pocket(cm)                              2.4 ---------------------------------------------------------------------- OB History  Gravidity:    1         Term:   0        Prem:   0        SAB:   0  TOP:          0       Ectopic:  0        Living: 0 ---------------------------------------------------------------------- Gestational Age (Fetus A)  Best:          17w 1d     Det. By:  Loman Chroman         EDD:   02/01/23 ---------------------------------------------------------------------- Fetal Evaluation (Fetus B)  Num Of Fetuses:          2  Fetal Heart Rate(bpm):   176  Cardiac Activity:        Observed  Presentation:            Transverse, head to maternal left  Placenta:                Anterior  Membrane Desc:      Dividing Membrane seen  Amniotic Fluid  AFI FV:      Within normal limits                              Largest Pocket(cm)                              4.2 ---------------------------------------------------------------------- Gestational Age (Fetus B)  Best:          17w 1d     Det.  By:  Loman Chroman         EDD:   02/01/23 ---------------------------------------------------------------------- Cervix Uterus Adnexa  Cervix  Not evaluated ---------------------------------------------------------------------- Comments  This patient had a limited ultrasound performed this afternoon  to assess for signs of placental abruption as her hemoglobin  and hematocrit decreased from 10.1 and 29.9 to  8.4 and  24.3.  On today's exam, there were no signs of retroplacental clots  noted in the placentas of either twin A or twin B to indicate a  placental abruption.  Both fetuses still have a normal heart rate and normal  amniotic fluid.  However, the amniotic fluid for twin A appears  to be lower than it was yesterday.  Due to her shortened cervix and her continued symptoms,  this probably will not be a successful pregnancy. ----------------------------------------------------------------------                   Johnell Comings, MD Electronically Signed Final Report   08/25/2022 06:02 pm ----------------------------------------------------------------------  DG Chest 1 View  Result Date: 08/25/2022 CLINICAL DATA:  27 year old female with history of chest discomfort. Seventeen weeks pregnant. EXAM: CHEST  1 VIEW COMPARISON:  Chest x-ray 05/28/2021. FINDINGS: Lung volumes are normal. No consolidative airspace disease. No pleural effusions. No pneumothorax. No pulmonary nodule or mass noted. Pulmonary vasculature is normal. Cardiopericardial silhouette appears borderline enlarged, likely accentuated by portable AP technique. Upper mediastinal contours are within normal limits. IMPRESSION: 1. No definite radiographic evidence of acute cardiopulmonary disease. 2. However, the cardiopericardial silhouette appears borderline enlarged, markedly different than prior examination from 2022. Although this could simply be technique related on today's x-ray, the possibility of developing cardiomegaly or pericardial effusion should be considered. Further evaluation with echocardiography should be considered if clinically appropriate. Electronically Signed   By: Vinnie Langton M.D.   On: 08/25/2022 17:31   [place] MFM OB TRANSVAGINAL  Result Date: 08/25/2022 ----------------------------------------------------------------------  OBSTETRICS REPORT                        (Signed Final 08/25/2022 11:37 am)  ---------------------------------------------------------------------- Patient Info  ID #:       [id number]                          D.O.B.:  [birth year] (26 yrs)  Name:       Carol Paul                Visit Date: 08/24/2022 06:25 pm ---------------------------------------------------------------------- Performed By  Attending:        Johnell Comings MD         Ref. Address:      [address]                                                              [place], [place]  Performed By:     Charleston Ropes  Hernandez        Secondary Phy.:    [facility]                    BS RDMS                                                              Care  Referred By:      Sallyanne Havers               Location:          [facility]                    Harolyn Rutherford MD                                [place] ---------------------------------------------------------------------- Orders  #  Description                           Code        Ordered By  1  [place] MFM OB TRANSVAGINAL                [phone]     MELANIE BHAMBRI ----------------------------------------------------------------------  #  Order #                     Accession #                Episode #  1  [id number]                   [id number]                 [id number] ---------------------------------------------------------------------- Indications  Vaginal bleeding in pregnancy, second           O46.92  trimester  Twin pregnancy following elective fetal         O30.003, O31.30X0  reduction, antepartum  Twin pregnancy, di/di, second trimester         O30.042  Encounter for cervical length                   Z36.86  [redacted] weeks gestation of pregnancy                 Z3A.17 ---------------------------------------------------------------------- Fetal Evaluation (Fetus A)  Num Of Fetuses:          2  Fetal Heart Rate(bpm):   164  Cardiac Activity:        Observed  Fetal Lie:               Lower Fetus  Presentation:             Breech  Placenta:                Anterior  P. Cord Insertion:       Previously visualized  Membrane Desc:      Dividing Membrane seen - Dichorionic.  Amniotic Fluid  AFI FV:      Within normal limits  Largest Pocket(cm)                              3.5  Comment:    Debris noted near cervix ---------------------------------------------------------------------- OB History  Gravidity:    1         Term:   0        Prem:   0        SAB:   0  TOP:          0       Ectopic:  0        Living: 0 ---------------------------------------------------------------------- Gestational Age (Fetus A)  Best:          17w 0d     Det. By:  Loman Chroman         EDD:   02/01/23 ---------------------------------------------------------------------- Anatomy (Fetus A)  Cranium:               Appears normal         Stomach:                Appears normal, left                                                                        sided  Choroid Plexus:        Appears normal         Bladder:                Appears normal ---------------------------------------------------------------------- Fetal Evaluation (Fetus B)  Num Of Fetuses:          2  Fetal Heart Rate(bpm):   173  Cardiac Activity:        Observed  Fetal Lie:               Upper Fetus  Presentation:            Transverse, head to maternal left  Placenta:                Anterior  P. Cord Insertion:       Previously visualized  Membrane Desc:      Dividing Membrane seen - Dichorionic.  Amniotic Fluid  AFI FV:      Within normal limits                              Largest Pocket(cm)                              3.6  Comment:    Debris noted near cervix ---------------------------------------------------------------------- Gestational Age (Fetus B)  Best:          17w 0d     Det. By:  Loman Chroman         EDD:   02/01/23 ---------------------------------------------------------------------- Anatomy (Fetus B)  Cranium:               Appears normal          Stomach:                Appears  normal, left                                                                        sided  Choroid Plexus:        Appears normal ---------------------------------------------------------------------- Cervix Uterus Adnexa  Cervix  Length:            1.4  cm.  Measured transvaginally ---------------------------------------------------------------------- Comments  This patient presented to the MAU due to vaginal bleeding.  She is status post reduction of a triplet gestation to twins at  [facility].  A transvaginal ultrasound performed today shows a cervical  length of 1.4 cm long without any signs of funneling.  There was normal amniotic fluid noted around twin A and twin  B.  A thick (dichorionic) membrane was noted separating the two  fetuses. ----------------------------------------------------------------------                   Johnell Comings, MD Electronically Signed Final Report   08/25/2022 11:37 am ----------------------------------------------------------------------  [place] MFM OB LIMITED  Result Date: 08/21/2022 ----------------------------------------------------------------------  OBSTETRICS REPORT                        (Signed Final 08/21/2022 01:23 pm) ---------------------------------------------------------------------- Patient Info  ID #:       [id number]                          D.O.B.:  1996-03-06 (26 yrs)  Name:       ETOLA AMANN                Visit Date: 08/21/2022 09:41 am ---------------------------------------------------------------------- Performed By  Attending:        Tama High MD        Ref. Address:      [address]                                                              [place], [place]  Performed By:     Berlinda Last          Secondary Phy.:    [place]  Referred By:       Sallyanne Havers               Location:          [facility]                    Harolyn Rutherford MD                                [place] ---------------------------------------------------------------------- Orders  #  Description                           Code        Ordered By  1  [place] MFM OB LIMITED                     76815.01    Verita Schneiders  2  [place] MFM OB TRANSVAGINAL                [id number]     Verita Schneiders ----------------------------------------------------------------------  #  Order #                     Accession #                Episode #  1  [id number]                   [id number]                 [id number]  2  [id number]                   [id number]                 [id number] ---------------------------------------------------------------------- Indications  Twin pregnancy following elective fetal         O30.003, O31.30X0  reduction, antepartum  Twin pregnancy, di/di, second trimester         O30.042  [redacted] weeks gestation of pregnancy                 Z3A.16  Encounter for cervical length                   Z36.86 ---------------------------------------------------------------------- Fetal Evaluation (Fetus A)  Num Of Fetuses:          2  Fetal Heart Rate(bpm):   159  Cardiac Activity:        Observed  Fetal Lie:               Lower Fetus  Presentation:            Breech  Placenta:                Anterior  P. Cord Insertion:       Visualized  Membrane Desc:      Dividing Membrane seen - Dichorionic.  Amniotic Fluid  AFI FV:      Within normal limits                              Largest Pocket(cm)                              2 ---------------------------------------------------------------------- OB History  Gravidity:    1  Term:   0        Prem:   0        SAB:   0  TOP:          0       Ectopic:  0        Living: 0 ---------------------------------------------------------------------- Gestational Age (Fetus A)  Best:          16w 4d     Det. By:  Loman Chroman         EDD:   02/01/23  ---------------------------------------------------------------------- Anatomy (Fetus A)  Cranium:               Appears normal         Stomach:                Appears normal, left                                                                        sided  Ventricles:            Appears normal         Bladder:                Appears normal  Choroid Plexus:        Appears normal ---------------------------------------------------------------------- Fetal Evaluation (Fetus B)  Num Of Fetuses:          2  Fetal Heart Rate(bpm):   134  Cardiac Activity:        Observed  Fetal Lie:               Upper Fetus  Presentation:            Transverse, head to maternal left  Placenta:                Anterior  P. Cord Insertion:       Visualized  Membrane Desc:      Dividing Membrane seen - Dichorionic.  Amniotic Fluid  AFI FV:      Within normal limits                              Largest Pocket(cm)                              3.3 ---------------------------------------------------------------------- Gestational Age (Fetus B)  Best:          16w 4d     Det. By:  Loman Chroman         EDD:   02/01/23 ---------------------------------------------------------------------- Anatomy (Fetus B)  Cranium:               Appears normal         Stomach:                Appears normal, left  sided  Ventricles:            Appears normal         Spine:                  Appears normal  Choroid Plexus:        Appears normal ---------------------------------------------------------------------- Cervix Uterus Adnexa  Cervix  Length:            1.8  cm.  Measured transvaginally  Uterus  No abnormality visualized.  Right Ovary  Within normal limits.  Left Ovary  Within normal limits.  Adnexa  No abnormality visualized ---------------------------------------------------------------------- Impression  We confirmed a dichorionic-diamniotic twin pregnancy.  Twin A: Lower fetus,  breech presentation, anterior placenta.  Amniotic fluid is normal and good fetal heart activity seen.  Twin B: Upper fetus, transverse lie and head to maternal left,  anterior placenta.  Amniotic fluid is normal and good fetal  heart activity seen.  We performed a transvaginal ultrasound.  The cervix  measures 1.8 to 2.5 cm.  xxxxxxxxxxxxxxxxxxxxxxxxxxxxxxxxxxxxxxxxxxxxxxxxxxxxxxxxx  xxxxxxxxxxxxxxxxxxxxxxxxxxxxxxxxxxxxx  Consultation  I had the pleasure of seeing Ms. Cawthorn today at the [facility]. She is G1 P0 at 16w 4d gestation with natural  triplet pregnancy with reduction of one fetus at 13-weeks'  gestation. Patient presented to the MAU yesterday evening  with complaints of abdominal pain and vaginal bleeding.  She had multifetal pregnancy reduction (from triplets to twins)  at [facility] at 13 weeks and 6 days gestation.  Triplet B was reduced.  Chorionic villous sampling was  performed on triplet A and C and karyotype was reported as  normal. Female and female fetuses were reported.  Pregnancy  was conceived by donor insemination at home.  The donor is  Caucasian and no genetic studies were performed.  She has  a same-sex partner with her.  4 days ago, patient presented to [facility] with  complaints of bleeding and the cervix was 2 cm dilated.  Yesterday she had severe abdominal pain and passed the  reduced fetus (planning to bury; declined pathology).  Patient  brought the fetus with her for confirmation.  Past medical history: No history of diabetes or hypertension  or any chronic medical conditions.  Past surgical history: Tonsillectomy, ankle surgery.  Medications: Prenatal vitamins, Phenergan.  Allergies: No known drug allergies.  Social history: Denies tobacco or drug or alcohol use.  Family history: No history of venous thromboembolism in the  family.  GYN history: No history of abnormal Pap smears or cervical  surgeries.  P/E: Patient is comfortably lying in bed; not in  distress.  Vitals: Stable; afebrile.  HEENT: Normal.  Abdomen: Soft; no tenderness in any of the quadrants; no  flank tenderness.  No pedal edema.  Labs: Hemoglobin 10.5, hematocrit 30.7, WBC 23.2, platelets  352.  Electrolytes normal.  Creatinine 0.4.  AST 25, ALT 23.  Blood type A positive.  Triplet pregnancy with miscarriage of the reduced fetus.  Before multifetal pregnancy reduction procedure, the patient  was given an 8% chance of miscarriage of all fetuses from  the procedure.  I reassured the patient of normal heart  activities and the surviving fetuses.  The likelihood of miscarriage is little slightly higher but should  decrease with cessation of vaginal bleeding.  Cervical  shortening seen he is more likely from the miscarriage.  I recommended a follow-up transvaginal ultrasound next  week to evaluate the cervical length.  If significant cervical  shortening is seen (less than 1 cm), we will consider rescue  cerclage.  Patient had questions about vaginal progesterone.  delivery rates and twins.  However, at her recent study told it  may be beneficial after 32 weeks and twin pregnancy.  We do  not routinely recommend vaginal progesterone in twin  pregnancies.  Patient was told to return to the MAU if she has significant  bleeding or fever or severe abdominal pain.  [facility] and has a new OB appointment. ---------------------------------------------------------------------- Recommendations  -Transvaginal ultrasound on 08/30/2022 at 8:30 AM at [facility] [place].  -Fetal anatomy scan and cervical length measurement on  09/10/2022 at 11:15 AM.  -[facility] appointment with [facility]. ----------------------------------------------------------------------                  Tama High, MD Electronically Signed Final Report   08/21/2022 01:23 pm ----------------------------------------------------------------------  [place] MFM OB  Transvaginal  Result Date: 08/21/2022 ----------------------------------------------------------------------  OBSTETRICS REPORT                        (Signed Final 08/21/2022 01:23 pm) ---------------------------------------------------------------------- Patient Info  ID #:       [id number]                          D.O.B.:  05-24-1996 (26 yrs)  Name:       Carol Paul                Visit Date: 08/21/2022 09:41 am ---------------------------------------------------------------------- Performed By  Attending:        Tama High MD        Ref. Address:      [place], [place]  Performed By:     Berlinda Last          Secondary Phy.:    [facility]                    RDMS                                                              Care  Referred By:      Sallyanne Havers               [facility]  [place] ---------------------------------------------------------------------- Orders  #  Description                           Code        Ordered By  1  [place] MFM OB LIMITED                     76815.01    Verita Schneiders  2  [place] MFM OB TRANSVAGINAL                [id number]     Verita Schneiders ----------------------------------------------------------------------  #  Order #                     Accession #                Episode #  1  [id number]                   [id number]                 [id number]  2  [id number]                   [id number]                 [id number] ---------------------------------------------------------------------- Indications  Twin pregnancy following elective fetal         O30.003, O31.30X0  reduction, antepartum  Twin pregnancy, di/di, second trimester         O30.042  [redacted] weeks gestation of pregnancy                 Z3A.16  Encounter for cervical length                   Z36.86  ---------------------------------------------------------------------- Fetal Evaluation (Fetus A)  Num Of Fetuses:          2  Fetal Heart Rate(bpm):   159  Cardiac Activity:        Observed  Fetal Lie:               Lower Fetus  Presentation:            Breech  Placenta:                Anterior  P. Cord Insertion:       Visualized  Membrane Desc:      Dividing Membrane seen - Dichorionic.  Amniotic Fluid  AFI FV:      Within normal limits                              Largest Pocket(cm)                              2 ---------------------------------------------------------------------- OB History  Gravidity:    1         Term:   0        Prem:   0        SAB:   0  TOP:          0       Ectopic:  0  Living: 0 ---------------------------------------------------------------------- Gestational Age (Fetus A)  Best:          16w 4d     Det. By:  Loman Chroman         EDD:   02/01/23 ---------------------------------------------------------------------- Anatomy (Fetus A)  Cranium:               Appears normal         Stomach:                Appears normal, left                                                                        sided  Ventricles:            Appears normal         Bladder:                Appears normal  Choroid Plexus:        Appears normal ---------------------------------------------------------------------- Fetal Evaluation (Fetus B)  Num Of Fetuses:          2  Fetal Heart Rate(bpm):   134  Cardiac Activity:        Observed  Fetal Lie:               Upper Fetus  Presentation:            Transverse, head to maternal left  Placenta:                Anterior  P. Cord Insertion:       Visualized  Membrane Desc:      Dividing Membrane seen - Dichorionic.  Amniotic Fluid  AFI FV:      Within normal limits                              Largest Pocket(cm)                              3.3 ---------------------------------------------------------------------- Gestational Age (Fetus B)  Best:           16w 4d     Det. By:  Loman Chroman         EDD:   02/01/23 ---------------------------------------------------------------------- Anatomy (Fetus B)  Cranium:               Appears normal         Stomach:                Appears normal, left                                                                        sided  Ventricles:            Appears normal         Spine:  Appears normal  Choroid Plexus:        Appears normal ---------------------------------------------------------------------- Cervix Uterus Adnexa  Cervix  Length:            1.8  cm.  Measured transvaginally  Uterus  No abnormality visualized.  Right Ovary  Within normal limits.  Left Ovary  Within normal limits.  Adnexa  No abnormality visualized ---------------------------------------------------------------------- Impression  We confirmed a dichorionic-diamniotic twin pregnancy.  Twin A: Lower fetus, breech presentation, anterior placenta.  Amniotic fluid is normal and good fetal heart activity seen.  Twin B: Upper fetus, transverse lie and head to maternal left,  anterior placenta.  Amniotic fluid is normal and good fetal  heart activity seen.  We performed a transvaginal ultrasound.  The cervix  measures 1.8 to 2.5 cm.  xxxxxxxxxxxxxxxxxxxxxxxxxxxxxxxxxxxxxxxxxxxxxxxxxxxxxxxxx  xxxxxxxxxxxxxxxxxxxxxxxxxxxxxxxxxxxxx  Consultation  I had the pleasure of seeing Ms. Merfeld today at the [facility]. She is G1 P0 at 16w 4d gestation with natural  triplet pregnancy with reduction of one fetus at 13-weeks'  gestation. Patient presented to the MAU yesterday evening  with complaints of abdominal pain and vaginal bleeding.  She had multifetal pregnancy reduction (from triplets to twins)  at [facility] at 13 weeks and 6 days gestation.  Triplet B was reduced.  Chorionic villous sampling was  performed on triplet A and C and karyotype was reported as  normal. Female and female fetuses were reported.  Pregnancy  was conceived  by donor insemination at home.  The donor is  Caucasian and no genetic studies were performed.  She has  a same-sex partner with her.  4 days ago, patient presented to [facility] with  complaints of bleeding and the cervix was 2 cm dilated.  Yesterday she had severe abdominal pain and passed the  reduced fetus (planning to bury; declined pathology).  Patient  brought the fetus with her for confirmation.  Past medical history: No history of diabetes or hypertension  or any chronic medical conditions.  Past surgical history: Tonsillectomy, ankle surgery.  Medications: Prenatal vitamins, Phenergan.  Allergies: No known drug allergies.  Social history: Denies tobacco or drug or alcohol use.  Family history: No history of venous thromboembolism in the  family.  GYN history: No history of abnormal Pap smears or cervical  surgeries.  P/E: Patient is comfortably lying in bed; not in distress.  Vitals: Stable; afebrile.  HEENT: Normal.  Abdomen: Soft; no tenderness in any of the quadrants; no  flank tenderness.  No pedal edema.  Labs: Hemoglobin 10.5, hematocrit 30.7, WBC 23.2, platelets  352.  Electrolytes normal.  Creatinine 0.4.  AST 25, ALT 23.  Blood type A positive.  Triplet pregnancy with miscarriage of the reduced fetus.  Before multifetal pregnancy reduction procedure, the patient  was given an 8% chance of miscarriage of all fetuses from  the procedure.  I reassured the patient of normal heart  activities and the surviving fetuses.  The likelihood of miscarriage is little slightly higher but should  decrease with cessation of vaginal bleeding.  Cervical  shortening seen he is more likely from the miscarriage.  I recommended a follow-up transvaginal ultrasound next  week to evaluate the cervical length.  If significant cervical  shortening is seen (less than 1 cm), we will consider rescue  cerclage.  Patient had questions about vaginal progesterone.  delivery rates and twins.  However, at her recent study  told it  may be beneficial after 32 weeks and  twin pregnancy.  We do  not routinely recommend vaginal progesterone in twin  pregnancies.  Patient was told to return to the MAU if she has significant  bleeding or fever or severe abdominal pain.  [facility] and has a new OB appointment. ---------------------------------------------------------------------- Recommendations  -Transvaginal ultrasound on 08/30/2022 at 8:30 AM at [facility] [place].  -Fetal anatomy scan and cervical length measurement on  09/10/2022 at 11:15 AM.  -[facility]. ----------------------------------------------------------------------                  Tama High, MD Electronically Signed Final Report   08/21/2022 01:23 pm ----------------------------------------------------------------------   Scheduled Meds:  docusate sodium  100 mg Oral Daily   ferrous sulfate  325 mg Oral QODAY   ketorolac  30 mg Intravenous Q6H   mometasone-formoterol  2 puff Inhalation BID   prenatal multivitamin  1 tablet Oral Q1200   progesterone  200 mg Vaginal QHS   Continuous Infusions:  lactated ringers 125 mL/hr at 08/26/22 2322   PRN Meds:acetaminophen, calcium carbonate, fentaNYL (SUBLIMAZE) injection, hydrOXYzine, oxyCODONE, prochlorperazine    LOS: 2 days   Donnamae Jude, MD 08/27/2022 7:31 AM

## 2022-08-27 NOTE — Progress Notes (Signed)
CSW attempted to meet with patient at bedside; however, patient reported that she was visiting with her dad and requested that CSW return at a later time.   CSW will attempt to meet with patient at a later time.   Abundio Miu, Adrian Worker [facility] Cell#: [phone]

## 2022-08-28 LAB — CBC
HCT: 22.9 % — ABNORMAL LOW (ref 36.0–46.0)
HCT: 28.8 % — ABNORMAL LOW (ref 36.0–46.0)
Hemoglobin: 7.8 g/dL — ABNORMAL LOW (ref 12.0–15.0)
Hemoglobin: 10.3 g/dL — ABNORMAL LOW (ref 12.0–15.0)
MCH: 31.7 pg (ref 26.0–34.0)
MCH: 32.6 pg (ref 26.0–34.0)
MCHC: 34.1 g/dL (ref 30.0–36.0)
MCHC: 35.8 g/dL (ref 30.0–36.0)
MCV: 93.1 fL (ref 80.0–100.0)
MCV: 91.1 fL (ref 80.0–100.0)
Platelets: 262 10*3/uL (ref 150–400)
Platelets: 279 10*3/uL (ref 150–400)
RBC: 2.46 MIL/uL — ABNORMAL LOW (ref 3.87–5.11)
RBC: 3.16 MIL/uL — ABNORMAL LOW (ref 3.87–5.11)
RDW: 12.9 % (ref 11.5–15.5)
RDW: 13.3 % (ref 11.5–15.5)
WBC: 14.7 10*3/uL — ABNORMAL HIGH (ref 4.0–10.5)
WBC: 17.6 10*3/uL — ABNORMAL HIGH (ref 4.0–10.5)
nRBC: 0 % (ref 0.0–0.2)
nRBC: 0 % (ref 0.0–0.2)

## 2022-08-28 LAB — TYPE AND SCREEN
ABO/RH(D): A POS
Antibody Screen: NEGATIVE
Unit division: 0
Unit division: 0

## 2022-08-28 LAB — BPAM RBC
Blood Product Expiration Date: 202403062359
Blood Product Expiration Date: 202403062359
ISSUE DATE / TIME: 202402191120
Unit Type and Rh: 6200
Unit Type and Rh: 6200

## 2022-08-28 LAB — PREPARE RBC (CROSSMATCH)

## 2022-08-28 MED ORDER — FENTANYL CITRATE (PF) 100 MCG/2ML IJ SOLN: 100.0000 ug | INTRAMUSCULAR | Status: DC | PRN

## 2022-08-28 MED ORDER — ACETAMINOPHEN 325 MG PO TABS
650.0000 mg | ORAL_TABLET | Freq: Once | ORAL | Status: AC
  Administered 2022-08-28: 650 mg via ORAL
  Filled 2022-08-28: qty 2

## 2022-08-28 MED ORDER — SODIUM CHLORIDE 0.9% IV SOLUTION: Freq: Once | INTRAVENOUS | Status: AC

## 2022-08-28 MED ORDER — OXYCODONE HCL 5 MG PO TABS
5.0000 mg | ORAL_TABLET | ORAL | Status: DC | PRN
  Administered 2022-08-28: 5 mg via ORAL
  Filled 2022-08-28: qty 1

## 2022-08-28 MED ORDER — DIPHENHYDRAMINE HCL 25 MG PO CAPS
25.0000 mg | ORAL_CAPSULE | Freq: Once | ORAL | Status: AC
  Administered 2022-08-28: 25 mg via ORAL
  Filled 2022-08-28: qty 1

## 2022-08-28 NOTE — Progress Notes (Signed)
CSW met with patient at bedside. CSW reintroduced self. Patient reported that now is a good time talk. CSW and patient spoke at length about patient's pregnancy and recent hospitalizations. CSW actively listened and provided emotional support. Patient shared about issues with continuity of care and interactions with certain doctors. CSW informed patient about patient experience as a way to share about concerns. Patient shared about guilty feelings surrounding decision to complete selective reduction. CSW assisted patient in exploring guilty feelings and reassured patient that she made an informed decision with the best intentions. CSW normalized guilty feelings and encouraged patient to challenge guilty feelings as they arise. Patient was tearful during conversation and acknowledged that she needs to feel her feelings as she has been feeling numb recently. CSW positively affirmed patient's awareness surrounding being intentional about feeling her feelings. CSW inquired about patient's coping skills during this hard time. Patient shared about having support from her wife. CSW asked if it would be helpful for CSW to continue to check in if patient remains hospitalized, patient reported yes. CSW asked if patient was interested in therapy resources, patient reported yes. CSW provided patient with therapy resources. Patient thanked CSW for visit.   CSW will continue to check in during admission.   Abundio Miu, [place] Worker [facility] Cell#: [phone]

## 2022-08-28 NOTE — Progress Notes (Signed)
CSW attempted to meet with patient; however, patient was asleep. CSW will return at a later time.   Abundio Miu, Lindsey Worker [facility] Cell#: [phone]

## 2022-08-28 NOTE — Progress Notes (Signed)
Patient ID: BHUMI KERSTIENS, female   DOB: 10/26/1995, 27 y.o.   MRN: [id number] FACULTY PRACTICE ANTEPARTUM(COMPREHENSIVE) NOTE  Carol Paul is a 27 y.o. G1P0000 at 87w2dby early ultrasound who is admitted for vaginal bleeding and pain.   Fetal presentation is unsure. Length of Stay:  3  Days  Subjective: Patient reports significant improvement in her vaginal bleeding. Used one pad overnight which is 3/4 saturated. She denies any cramping pain or contractions. She admits to feeling lightheaded when ambulating to the bathroom. She is eager to be discharged today. Patient denies any chest pain or difficulty breathing Patient reports the fetal movement as active. Patient describes fluid per vagina as None.  Vitals:  Blood pressure 93/64, pulse (!) 107, temperature 97.9 F (36.6 C), temperature source Oral, resp. rate 17, height 5' 2"$  (1.575 m), weight 90.7 kg, SpO2 99 %. Physical Examination:  General appearance - alert, well appearing, and in no distress Heart - normal rate and regular rhythm Abdomen - soft, nontender, nondistended Cervical Exam: Not evaluated.  Extremities: extremities normal, atraumatic, no cyanosis or edema and Homans sign is negative, no sign of DVT  Membranes:intact  Fetal Monitoring:   162, 164 this morning  Labs:  Results for orders placed or performed during the hospital encounter of 08/24/22 (from the past 24 hour(s))  CBC   Collection Time: 08/28/22  5:20 AM  Result Value Ref Range   WBC 14.7 (H) 4.0 - 10.5 K/uL   RBC 2.46 (L) 3.87 - 5.11 MIL/uL   Hemoglobin 7.8 (L) 12.0 - 15.0 g/dL   HCT 22.9 (L) 36.0 - 46.0 %   MCV 93.1 80.0 - 100.0 fL   MCH 31.7 26.0 - 34.0 pg   MCHC 34.1 30.0 - 36.0 g/dL   RDW 12.9 11.5 - 15.5 %   Platelets 262 150 - 400 K/uL   nRBC 0.0 0.0 - 0.2 %  Prepare RBC (crossmatch)   Collection Time: 08/28/22  8:55 AM  Result Value Ref Range   Order Confirmation      ORDER PROCESSED BY BLOOD BANK Performed at [facility] [address] [id number]     Medications:  Scheduled  sodium chloride   Intravenous Once   docusate sodium  100 mg Oral Daily   ferrous sulfate  325 mg Oral QODAY   ketorolac  30 mg Intravenous Q6H   mometasone-formoterol  2 puff Inhalation BID   prenatal multivitamin  1 tablet Oral Q1200   progesterone  200 mg Vaginal QHS   I have reviewed the patient's current medications.  ASSESSMENT: Patient Active Problem List   Diagnosis Date Noted   Twin pregnancy 08/25/2022   Vaginal bleeding in pregnancy, second trimester 08/24/2022   Twin pregnancy following selective fetal reduction in second trimester 08/21/2022   Anxiety 08/21/2022   [redacted] weeks gestation of pregnancy 08/21/2022   Vaginal bleeding during pregnancy 08/20/2022   MDD (major depressive disorder) 11/26/2016    PLAN: 27yo P0 at 144w4dith triplet pregnancy recently reduced to di-di twin pregnancy admitted with concerns for chorioamnionitis.   WBC remains stable. No si/sx of chorioamnionitis.   Discussed anemia and patient agrees to receiving 2 unit pRBC  3.  Patient desires to be discharged home if no change in her status following completion of blood transfusion.    PeMora BellmanMD ObMar[facility]halFaSouthwest [facility] Company[facility]

## 2022-08-29 DIAGNOSIS — D62 Acute posthemorrhagic anemia: Secondary | ICD-10-CM | POA: Diagnosis not present

## 2022-08-29 DIAGNOSIS — O26872 Cervical shortening, second trimester: Secondary | ICD-10-CM | POA: Diagnosis present

## 2022-08-29 LAB — BPAM RBC
Blood Product Expiration Date: 202403102359
Blood Product Expiration Date: 202403102359
ISSUE DATE / TIME: 202402210944
ISSUE DATE / TIME: 202402211322
Unit Type and Rh: 6200
Unit Type and Rh: 6200

## 2022-08-29 LAB — TYPE AND SCREEN
ABO/RH(D): A POS
Antibody Screen: NEGATIVE
Unit division: 0
Unit division: 0

## 2022-08-29 MED ORDER — PROCHLORPERAZINE MALEATE 10 MG PO TABS: 10.0000 mg | ORAL_TABLET | Freq: Four times a day (QID) | ORAL | 0 refills | Status: DC | PRN

## 2022-08-29 MED ORDER — DOCUSATE SODIUM 100 MG PO CAPS: 100.0000 mg | ORAL_CAPSULE | Freq: Every day | ORAL | 0 refills | Status: DC

## 2022-08-29 MED ORDER — HYDROXYZINE HCL 25 MG PO TABS: 25.0000 mg | ORAL_TABLET | Freq: Three times a day (TID) | ORAL | 2 refills | Status: DC | PRN

## 2022-08-29 MED ORDER — PROGESTERONE 200 MG PO CAPS: 200.0000 mg | ORAL_CAPSULE | Freq: Every day | ORAL | 3 refills | Status: DC

## 2022-08-29 MED ORDER — FERROUS SULFATE 325 (65 FE) MG PO TABS: 325.0000 mg | ORAL_TABLET | ORAL | 3 refills | Status: DC

## 2022-08-29 NOTE — Discharge Summary (Signed)
Antenatal Physician Discharge Summary  Patient ID: Carol Paul MRN: [id number] DOB/AGE: 01-Jul-1996 27 y.o.  Admit date: 08/24/2022 Discharge date: 08/29/2022  Admission Diagnoses:  Vaginal bleeding and abdominal cramping at [redacted] weeks gestation,  s/p delivery of demised fetus after elective reduction from triplet to twin gestation around [redacted] weeks gestation,shortened cervix,  anemia due to blood loss.    Discharge Diagnoses:  The same  Prenatal Procedures: Ultrasound, Blood transfusion with 3 pBRCs  Consults: Maternal Fetal Medicine  Hospital Course:  Carol Paul is a 27 y.o. G1P0000 with IUP at 17 weeks admitted for observation for vaginal bleeding and abdominal cramping.  There was initial concern about possible intrauterine infection with her initial WBC of 20.6, but this was noted to decrease and she had no other concerning symptoms. Also noted to have a short cervix of 1.4 cm and was placed on vaginal progesterone.   She did have significant vaginal bleeding, and was anemic down to hemoglobin of 7.8, she received a total of  3 pRBCs during her admission and her discharge hemoglobin was 10.3.  Patient reported having episode of bleeding the night prior to discharge and wanted to stay in house until she had at least 24 hours without bleeding, but then changed her mind later in the day and asked to be discharged to home.  Close outpatient follow up had already been arranged for patient, strict bleeding and pain precautions reviewed.  She was deemed stable for discharge to home with close outpatient follow up.  Discharge Exam: Temp:  [98.1 F (36.7 C)-98.5 F (36.9 C)] 98.3 F (36.8 C) (02/22 1212) Pulse Rate:  [104-123] 107 (02/22 1212) Resp:  [16-17] 16 (02/22 1212) BP: (90-119)/(47-73) 103/47 (02/22 1212) SpO2:  [96 %-99 %] 98 % (02/22 1212) Physical Examination: CONSTITUTIONAL: Well-developed, well-nourished female in no acute distress.  HENT:  Normocephalic, atraumatic,  External right and left ear normal.  EYES: Conjunctivae and EOM are normal. Pupils are equal, round, and reactive to light. No scleral icterus.  NECK: Normal range of motion, supple, no masses SKIN: Skin is warm and dry. No rash noted. Not diaphoretic. No erythema. No pallor. NEUROLOGIC: Alert and oriented to person, place, and time. Normal reflexes, muscle tone coordination. No cranial nerve deficit noted. PSYCHIATRIC: Normal mood and affect. Normal behavior. Normal judgment and thought content. CARDIOVASCULAR: Normal heart rate noted, regular rhythm RESPIRATORY: Effort and breath sounds normal, no problems with respiration noted MUSCULOSKELETAL: Normal range of motion. No edema and no tenderness. 2+ distal pulses. ABDOMEN: Soft, nontender, nondistended, gravid. CERVIX:  Deferred  Significant Diagnostic Studies:  Results for orders placed or performed during the hospital encounter of 08/24/22 (from the past 168 hour(s))  CBC   Collection Time: 08/24/22  5:56 PM  Result Value Ref Range   WBC 20.6 (H) 4.0 - 10.5 K/uL   RBC 3.21 (L) 3.87 - 5.11 MIL/uL   Hemoglobin 10.1 (L) 12.0 - 15.0 g/dL   HCT 29.9 (L) 36.0 - 46.0 %   MCV 93.1 80.0 - 100.0 fL   MCH 31.5 26.0 - 34.0 pg   MCHC 33.8 30.0 - 36.0 g/dL   RDW 12.3 11.5 - 15.5 %   Platelets 396 150 - 400 K/uL   nRBC 0.0 0.0 - 0.2 %  Type and screen [place]   Collection Time: 08/24/22 11:24 PM  Result Value Ref Range   ABO/RH(D) A POS    Antibody Screen NEG    Sample Expiration 08/27/2022,2359  Unit Number [id number]    Blood Component Type RED CELLS,LR    Unit division 00    Status of Unit ISSUED,FINAL    Transfusion Status OK TO TRANSFUSE    Crossmatch Result Compatible    Unit Number [id number]    Blood Component Type RED CELLS,LR    Unit division 00    Status of Unit REL FROM [facility]    Transfusion Status OK TO TRANSFUSE    Crossmatch Result      Compatible Performed at [facility], [place] [address], [place], [place] [address]   BPAM [facility]   Collection Time: 08/24/22 11:24 PM  Result Value Ref Range   ISSUE DATE / TIME [id number]    Blood Product Unit Number [id number]    PRODUCT CODE E0382V00    Unit Type and Rh 6200    Blood Product Expiration Date [id number]    Blood Product Unit Number [id number]    PRODUCT CODE E0382V00    Unit Type and Rh 6200    Blood Product Expiration Date [id number]   CBC with Differential/Platelet   Collection Time: 08/25/22  1:40 PM  Result Value Ref Range   WBC 13.3 (H) 4.0 - 10.5 K/uL   RBC 2.61 (L) 3.87 - 5.11 MIL/uL   Hemoglobin 8.4 (L) 12.0 - 15.0 g/dL   HCT 24.3 (L) 36.0 - 46.0 %   MCV 93.1 80.0 - 100.0 fL   MCH 32.2 26.0 - 34.0 pg   MCHC 34.6 30.0 - 36.0 g/dL   RDW 12.3 11.5 - 15.5 %   Platelets 272 150 - 400 K/uL   nRBC 0.0 0.0 - 0.2 %   Neutrophils Relative % 84 %   Neutro Abs 11.3 (H) 1.7 - 7.7 K/uL   Lymphocytes Relative 8 %   Lymphs Abs 1.0 0.7 - 4.0 K/uL   Monocytes Relative 3 %   Monocytes Absolute 0.4 0.1 - 1.0 K/uL   Eosinophils Relative 2 %   Eosinophils Absolute 0.2 0.0 - 0.5 K/uL   Basophils Relative 0 %   Basophils Absolute 0.0 0.0 - 0.1 K/uL   Immature Granulocytes 3 %   Abs Immature Granulocytes 0.37 (H) 0.00 - 0.07 K/uL  Comprehensive metabolic panel   Collection Time: 08/25/22  3:05 PM  Result Value Ref Range   Sodium 133 (L) 135 - 145 mmol/L   Potassium 3.7 3.5 - 5.1 mmol/L   Chloride 103 98 - 111 mmol/L   CO2 19 (L) 22 - 32 mmol/L   Glucose, Bld 111 (H) 70 - 99 mg/dL   BUN <5 (L) 6 - 20 mg/dL   Creatinine, Ser 0.52 0.44 - 1.00 mg/dL   Calcium 8.5 (L) 8.9 - 10.3 mg/dL   Total Protein 5.9 (L) 6.5 - 8.1 g/dL   Albumin 2.3 (L) 3.5 - 5.0 g/dL   AST 22 15 - 41 U/L   ALT 13 0 - 44 U/L   Alkaline Phosphatase 74 38 - 126 U/L   Total Bilirubin 0.2 (L) 0.3 - 1.2 mg/dL   GFR, Estimated >60 >60 mL/min   Anion gap 11 5 - 15  Brain natriuretic peptide   Collection Time: 08/25/22  3:05 PM  Result  Value Ref Range   B Natriuretic Peptide 120.5 (H) 0.0 - 100.0 pg/mL  CBC with Differential/Platelet   Collection Time: 08/25/22  3:05 PM  Result Value Ref Range   WBC 13.6 (H) 4.0 - 10.5 K/uL   RBC 2.94 (L) 3.87 - 5.11 MIL/uL  Hemoglobin 9.2 (L) 12.0 - 15.0 g/dL   HCT 28.4 (L) 36.0 - 46.0 %   MCV 96.6 80.0 - 100.0 fL   MCH 31.3 26.0 - 34.0 pg   MCHC 32.4 30.0 - 36.0 g/dL   RDW 12.4 11.5 - 15.5 %   Platelets 308 150 - 400 K/uL   nRBC 0.0 0.0 - 0.2 %   Neutrophils Relative % 83 %   Neutro Abs 11.3 (H) 1.7 - 7.7 K/uL   Lymphocytes Relative 8 %   Lymphs Abs 1.1 0.7 - 4.0 K/uL   Monocytes Relative 3 %   Monocytes Absolute 0.5 0.1 - 1.0 K/uL   Eosinophils Relative 1 %   Eosinophils Absolute 0.2 0.0 - 0.5 K/uL   Basophils Relative 1 %   Basophils Absolute 0.1 0.0 - 0.1 K/uL   Immature Granulocytes 4 %   Abs Immature Granulocytes 0.52 (H) 0.00 - 0.07 K/uL  Troponin I (High Sensitivity)   Collection Time: 08/25/22  3:05 PM  Result Value Ref Range   Troponin I (High Sensitivity) 4 <18 ng/L  Troponin I (High Sensitivity)   Collection Time: 08/25/22  5:12 PM  Result Value Ref Range   Troponin I (High Sensitivity) 5 <18 ng/L  Resp panel by RT-PCR (RSV, Flu A&B, Covid) Anterior Nasal Swab   Collection Time: 08/25/22  7:35 PM   Specimen: Anterior Nasal Swab  Result Value Ref Range   SARS Coronavirus 2 by RT PCR NEGATIVE NEGATIVE   Influenza A by PCR NEGATIVE NEGATIVE   Influenza B by PCR NEGATIVE NEGATIVE   Resp Syncytial Virus by PCR NEGATIVE NEGATIVE  CBC with Differential/Platelet   Collection Time: 08/26/22  5:19 AM  Result Value Ref Range   WBC 14.2 (H) 4.0 - 10.5 K/uL   RBC 2.67 (L) 3.87 - 5.11 MIL/uL   Hemoglobin 8.5 (L) 12.0 - 15.0 g/dL   HCT 24.9 (L) 36.0 - 46.0 %   MCV 93.3 80.0 - 100.0 fL   MCH 31.8 26.0 - 34.0 pg   MCHC 34.1 30.0 - 36.0 g/dL   RDW 12.5 11.5 - 15.5 %   Platelets 242 150 - 400 K/uL   nRBC 0.0 0.0 - 0.2 %   Neutrophils Relative % 87 %   Neutro Abs  12.4 (H) 1.7 - 7.7 K/uL   Lymphocytes Relative 8 %   Lymphs Abs 1.1 0.7 - 4.0 K/uL   Monocytes Relative 2 %   Monocytes Absolute 0.3 0.1 - 1.0 K/uL   Eosinophils Relative 1 %   Eosinophils Absolute 0.1 0.0 - 0.5 K/uL   Basophils Relative 1 %   Basophils Absolute 0.1 0.0 - 0.1 K/uL   nRBC 0 0 /100 WBC   Metamyelocytes Relative 1 %   Abs Immature Granulocytes 0.10 (H) 0.00 - 0.07 K/uL  Brain natriuretic peptide   Collection Time: 08/26/22  5:19 AM  Result Value Ref Range   B Natriuretic Peptide 115.1 (H) 0.0 - 100.0 pg/mL  Prepare RBC (crossmatch)   Collection Time: 08/26/22  9:40 AM  Result Value Ref Range   Order Confirmation      ORDER PROCESSED BY BLOOD BANK Performed at [facility], [address] [place],  [address]   ECHOCARDIOGRAM COMPLETE   Collection Time: 08/26/22 10:41 AM  Result Value Ref Range   Weight 3,199.32 oz   Height 62 in   BP 104/64 mmHg   S' Lateral 3.30 cm   Est EF 60 - 65%   CBC  Collection Time: 08/26/22  4:24 PM  Result Value Ref Range   WBC 14.9 (H) 4.0 - 10.5 K/uL   RBC 3.01 (L) 3.87 - 5.11 MIL/uL   Hemoglobin 9.4 (L) 12.0 - 15.0 g/dL   HCT 28.1 (L) 36.0 - 46.0 %   MCV 93.4 80.0 - 100.0 fL   MCH 31.2 26.0 - 34.0 pg   MCHC 33.5 30.0 - 36.0 g/dL   RDW 12.5 11.5 - 15.5 %   Platelets 321 150 - 400 K/uL   nRBC 0.0 0.0 - 0.2 %  CBC   Collection Time: 08/27/22  8:43 AM  Result Value Ref Range   WBC 13.9 (H) 4.0 - 10.5 K/uL   RBC 2.87 (L) 3.87 - 5.11 MIL/uL   Hemoglobin 9.0 (L) 12.0 - 15.0 g/dL   HCT 27.2 (L) 36.0 - 46.0 %   MCV 94.8 80.0 - 100.0 fL   MCH 31.4 26.0 - 34.0 pg   MCHC 33.1 30.0 - 36.0 g/dL   RDW 12.7 11.5 - 15.5 %   Platelets 291 150 - 400 K/uL   nRBC 0.0 0.0 - 0.2 %  Brain natriuretic peptide   Collection Time: 08/27/22  8:43 AM  Result Value Ref Range   B Natriuretic Peptide 67.0 0.0 - 100.0 pg/mL  Type and screen [place]   Collection Time: 08/27/22  8:43 AM  Result Value Ref Range    ABO/RH(D) A POS    Antibody Screen NEG    Sample Expiration 08/30/2022,2359    Unit Number [id number]    Blood Component Type RED CELLS,LR    Unit division 00    Status of Unit ISSUED,FINAL    Transfusion Status OK TO TRANSFUSE    Crossmatch Result      Compatible Performed at [facility], [address], [place], [place] [address]    Unit Number [id number]    Blood Component Type RED CELLS,LR    Unit division 00    Status of Unit ISSUED,FINAL    Transfusion Status OK TO TRANSFUSE    Crossmatch Result Compatible   BPAM RBC   Collection Time: 08/27/22  8:43 AM  Result Value Ref Range   ISSUE DATE / TIME [id number]    Blood Product Unit Number [id number]    PRODUCT CODE [id number]    Unit Type and Rh 6200    Blood Product Expiration Date [id number]    ISSUE DATE / TIME [id number]    Blood Product Unit Number [id number]    PRODUCT CODE E0382V00    Unit Type and Rh 6200    Blood Product Expiration Date [id number]   CBC   Collection Time: 08/28/22  5:20 AM  Result Value Ref Range   WBC 14.7 (H) 4.0 - 10.5 K/uL   RBC 2.46 (L) 3.87 - 5.11 MIL/uL   Hemoglobin 7.8 (L) 12.0 - 15.0 g/dL   HCT 22.9 (L) 36.0 - 46.0 %   MCV 93.1 80.0 - 100.0 fL   MCH 31.7 26.0 - 34.0 pg   MCHC 34.1 30.0 - 36.0 g/dL   RDW 12.9 11.5 - 15.5 %   Platelets 262 150 - 400 K/uL   nRBC 0.0 0.0 - 0.2 %  Prepare RBC (crossmatch)   Collection Time: 08/28/22  8:55 AM  Result Value Ref Range   Order Confirmation      ORDER PROCESSED BY BLOOD BANK Performed at [facility], [place] [address] [place], [place] [address]   CBC  Collection Time: 08/28/22  6:17 PM  Result Value Ref Range   WBC 17.6 (H) 4.0 - 10.5 K/uL   RBC 3.16 (L) 3.87 - 5.11 MIL/uL   Hemoglobin 10.3 (L) 12.0 - 15.0 g/dL   HCT 28.8 (L) 36.0 - 46.0 %   MCV 91.1 80.0 - 100.0 fL   MCH 32.6 26.0 - 34.0 pg   MCHC 35.8 30.0 - 36.0 g/dL   RDW 13.3 11.5 - 15.5 %   Platelets 279 150 - 400 K/uL   nRBC 0.0 0.0 -  0.2 %   ECHOCARDIOGRAM COMPLETE  Result Date: 08/26/2022    ECHOCARDIOGRAM REPORT   Patient Name:   Carol Paul Date of Exam: 08/26/2022 Medical Rec #:  [id number]        Height:       62.0 in Accession #:    [id number]       Weight:       200.0 lb Date of Birth:  13-Nov-1995        BSA:          1.912 m Patient Age:    26 years         BP:           104/64 mmHg Patient Gender: F                HR:           115 bpm. Exam Location:  Inpatient Procedure: 2D Echo, Color Doppler and Cardiac Doppler Indications:    pericardial effusion  History:        Patient has no prior history of Echocardiogram examinations.                 Signs/Symptoms:pregnant.  Sonographer:    Johny Chess RDCS Referring Phys: [id number] [place]  1. Left ventricular ejection fraction, by estimation, is 60 to 65%. The left ventricle has normal function. The left ventricle has no regional wall motion abnormalities. Indeterminate diastolic filling due to E-A fusion.  2. Right ventricular systolic function is normal. The right ventricular size is normal. Tricuspid regurgitation signal is inadequate for assessing PA pressure.  3. The mitral valve is normal in structure. No evidence of mitral valve regurgitation. No evidence of mitral stenosis.  4. The aortic valve is tricuspid. Aortic valve regurgitation is not visualized. No aortic stenosis is present.  5. The inferior vena cava is normal in size with greater than 50% respiratory variability, suggesting right atrial pressure of 3 mmHg.  6. No pericardial effusion. FINDINGS  Left Ventricle: Left ventricular ejection fraction, by estimation, is 60 to 65%. The left ventricle has normal function. The left ventricle has no regional wall motion abnormalities. The left ventricular internal cavity size was normal in size. There is  no left ventricular hypertrophy. Indeterminate diastolic filling due to E-A fusion. Right Ventricle: The right ventricular size is normal. No  increase in right ventricular wall thickness. Right ventricular systolic function is normal. Tricuspid regurgitation signal is inadequate for assessing PA pressure. Left Atrium: Left atrial size was normal in size. Right Atrium: Right atrial size was normal in size. Pericardium: There is no evidence of pericardial effusion. Mitral Valve: The mitral valve is normal in structure. No evidence of mitral valve regurgitation. No evidence of mitral valve stenosis. Tricuspid Valve: The tricuspid valve is normal in structure. Tricuspid valve regurgitation is trivial. Aortic Valve: The aortic valve is tricuspid. Aortic valve regurgitation is not visualized. No aortic stenosis is present. Pulmonic  Valve: The pulmonic valve was normal in structure. Pulmonic valve regurgitation is not visualized. Aorta: The aortic root is normal in size and structure. Venous: The inferior vena cava is normal in size with greater than 50% respiratory variability, suggesting right atrial pressure of 3 mmHg. IAS/Shunts: No atrial level shunt detected by color flow Doppler.  LEFT VENTRICLE PLAX 2D LVIDd:         5.40 cm LVIDs:         3.30 cm LV PW:         0.80 cm LV IVS:        0.60 cm LVOT diam:     1.90 cm LV SV:         51 LV SV Index:   27 LVOT Area:     2.84 cm  RIGHT VENTRICLE             IVC RV Basal diam:  2.80 cm     IVC diam: 1.50 cm RV S prime:     12.20 cm/s TAPSE (M-mode): 2.7 cm LEFT ATRIUM             Index        RIGHT ATRIUM           Index LA diam:        3.90 cm 2.04 cm/m   RA Area:     13.70 cm LA Vol (A2C):   35.8 ml 18.73 ml/m  RA Volume:   35.30 ml  18.47 ml/m LA Vol (A4C):   33.7 ml 17.63 ml/m LA Biplane Vol: 35.3 ml 18.47 ml/m  AORTIC VALVE LVOT Vmax:   131.00 cm/s LVOT Vmean:  85.000 cm/s LVOT VTI:    0.181 m  AORTA Ao Root diam: 2.50 cm Ao Asc diam:  2.80 cm MV E velocity: 94.50 cm/s MV A velocity: 96.60 cm/s  SHUNTS MV E/A ratio:  0.98        Systemic VTI:  0.18 m                            Systemic Diam: 1.90 cm  Dalton McleanMD Electronically signed by Franki Monte Signature Date/Time: 08/26/2022/3:47:55 PM    Final    [place] MFM OB LIMITED  Result Date: 08/25/2022 ----------------------------------------------------------------------  OBSTETRICS REPORT                        (Signed Final 08/25/2022 06:02 pm) ---------------------------------------------------------------------- Patient Info  ID #:       [id number]                          D.O.B.:  21-Feb-1996 (26 yrs)  Name:       Carol Paul                Visit Date: 08/25/2022 05:13 pm ---------------------------------------------------------------------- Performed By  Attending:        Johnell Comings MD         Ref. Address:      [address]                                                              [place], [place]  [id number]  Performed By:     Lesleigh Noe Summer        Secondary Phy.:    [facility]                    RDMS                                                              Care  Referred By:      Eduard Clos                Location[facility]                    [place] ---------------------------------------------------------------------- Orders  #  Description                           Code        Ordered By  1  [place] MFM OB LIMITED                     76815.01    CHARLIE PICKENS ----------------------------------------------------------------------  #  Order #                     Accession #                Episode #  1  [id number]                   [id number]                 [id number] ---------------------------------------------------------------------- Indications  Twin pregnancy following elective fetal         O30.003, O31.30X0  reduction, antepartum  Twin pregnancy, di/di, second trimester         O30.042  [redacted] weeks gestation of pregnancy                 Z3A.17 ---------------------------------------------------------------------- Fetal  Evaluation (Fetus A)  Num Of Fetuses:          2  Fetal Heart Rate(bpm):   187  Cardiac Activity:        Observed  Presentation:            Variable  Placenta:                Right lateral  Membrane Desc:      Dividing Membrane seen  Amniotic Fluid  AFI FV:      Subjectively low-normal                              Largest Pocket(cm)                              2.4 ---------------------------------------------------------------------- OB History  Gravidity:    1         Term:   0  Prem:   0        SAB:   0  TOP:          0       Ectopic:  0        Living: 0 ---------------------------------------------------------------------- Gestational Age (Fetus A)  Best:          17w 1d     Det. By:  Loman Chroman         EDD:   02/01/23 ---------------------------------------------------------------------- Fetal Evaluation (Fetus B)  Num Of Fetuses:          2  Fetal Heart Rate(bpm):   176  Cardiac Activity:        Observed  Presentation:            Transverse, head to maternal left  Placenta:                Anterior  Membrane Desc:      Dividing Membrane seen  Amniotic Fluid  AFI FV:      Within normal limits                              Largest Pocket(cm)                              4.2 ---------------------------------------------------------------------- Gestational Age (Fetus B)  Best:          17w 1d     Det. By:  Loman Chroman         EDD:   02/01/23 ---------------------------------------------------------------------- Cervix Uterus Adnexa  Cervix  Not evaluated ---------------------------------------------------------------------- Comments  This patient had a limited ultrasound performed this afternoon  to assess for signs of placental abruption as her hemoglobin  and hematocrit decreased from 10.1 and 29.9 to 8.4 and  24.3.  On today's exam, there were no signs of retroplacental clots  noted in the placentas of either twin A or twin B to indicate a  placental abruption.  Both fetuses still have a normal  heart rate and normal  amniotic fluid.  However, the amniotic fluid for twin A appears  to be lower than it was yesterday.  Due to her shortened cervix and her continued symptoms,  this probably will not be a successful pregnancy. ----------------------------------------------------------------------                   Johnell Comings, MD Electronically Signed Final Report   08/25/2022 06:02 pm ----------------------------------------------------------------------  DG Chest 1 View  Result Date: 08/25/2022 CLINICAL DATA:  27 year old female with history of chest discomfort. Seventeen weeks pregnant. EXAM: CHEST  1 VIEW COMPARISON:  Chest x-ray 05/28/2021. FINDINGS: Lung volumes are normal. No consolidative airspace disease. No pleural effusions. No pneumothorax. No pulmonary nodule or mass noted. Pulmonary vasculature is normal. Cardiopericardial silhouette appears borderline enlarged, likely accentuated by portable AP technique. Upper mediastinal contours are within normal limits. IMPRESSION: 1. No definite radiographic evidence of acute cardiopulmonary disease. 2. However, the cardiopericardial silhouette appears borderline enlarged, markedly different than prior examination from 2022. Although this could simply be technique related on today's x-ray, the possibility of developing cardiomegaly or pericardial effusion should be considered. Further evaluation with echocardiography should be considered if clinically appropriate. Electronically Signed   By: Vinnie Langton M.D.   On: 08/25/2022 17:31   [place] MFM OB TRANSVAGINAL  Result Date: 08/25/2022 ----------------------------------------------------------------------  OBSTETRICS REPORT                        (  Signed Final 08/25/2022 11:37 am) ---------------------------------------------------------------------- Patient Info  ID #:       [id number]                          D.O.B.:  09-29-1995 (26 yrs)  Name:       Carol Paul                Visit Date: 08/24/2022  06:25 pm ---------------------------------------------------------------------- Performed By  Attending:        Johnell Comings MD         Ref. Address:      [address]                                                              [place], [place]  Performed By:     Stephenie Acres        Secondary Phy.:    [facility]                    BS RDMS                                                              Care  Referred By:      Sallyanne Havers               Location:          [facility]                    Harolyn Rutherford MD                                [place] ---------------------------------------------------------------------- Orders  #  Description                           Code        Ordered By  1  [place] MFM OB TRANSVAGINAL                [id number]     Julianne Handler ----------------------------------------------------------------------  #  Order #                     Accession #                Episode #  1  [id number]                   [id number]                 [id number] ---------------------------------------------------------------------- Indications  Vaginal bleeding in pregnancy, second  O48.92  trimester  Twin pregnancy following elective fetal         O30.003, O31.30X0  reduction, antepartum  Twin pregnancy, di/di, second trimester         O30.042  Encounter for cervical length                   Z36.86  [redacted] weeks gestation of pregnancy                 Z3A.17 ---------------------------------------------------------------------- Fetal Evaluation (Fetus A)  Num Of Fetuses:          2  Fetal Heart Rate(bpm):   164  Cardiac Activity:        Observed  Fetal Lie:               Lower Fetus  Presentation:            Breech  Placenta:                Anterior  P. Cord Insertion:       Previously visualized  Membrane Desc:      Dividing Membrane seen - Dichorionic.  Amniotic Fluid  AFI FV:      Within normal limits                               Largest Pocket(cm)                              3.5  Comment:    Debris noted near cervix ---------------------------------------------------------------------- OB History  Gravidity:    1         Term:   0        Prem:   0        SAB:   0  TOP:          0       Ectopic:  0        Living: 0 ---------------------------------------------------------------------- Gestational Age (Fetus A)  Best:          17w 0d     Det. By:  Loman Chroman         EDD:   02/01/23 ---------------------------------------------------------------------- Anatomy (Fetus A)  Cranium:               Appears normal         Stomach:                Appears normal, left                                                                        sided  Choroid Plexus:        Appears normal         Bladder:                Appears normal ---------------------------------------------------------------------- Fetal Evaluation (Fetus B)  Num Of Fetuses:          2  Fetal Heart Rate(bpm):   173  Cardiac Activity:        Observed  Fetal Lie:  Upper Fetus  Presentation:            Transverse, head to maternal left  Placenta:                Anterior  P. Cord Insertion:       Previously visualized  Membrane Desc:      Dividing Membrane seen - Dichorionic.  Amniotic Fluid  AFI FV:      Within normal limits                              Largest Pocket(cm)                              3.6  Comment:    Debris noted near cervix ---------------------------------------------------------------------- Gestational Age (Fetus B)  Best:          17w 0d     Det. By:  Loman Chroman         EDD:   02/01/23 ---------------------------------------------------------------------- Anatomy (Fetus B)  Cranium:               Appears normal         Stomach:                Appears normal, left                                                                        sided  Choroid Plexus:        Appears normal  ---------------------------------------------------------------------- Cervix Uterus Adnexa  Cervix  Length:            1.4  cm.  Measured transvaginally ---------------------------------------------------------------------- Comments  This patient presented to the MAU due to vaginal bleeding.  She is status post reduction of a triplet gestation to twins at  [facility].  A transvaginal ultrasound performed today shows a cervical  length of 1.4 cm long without any signs of funneling.  There was normal amniotic fluid noted around twin A and twin  B.  A thick (dichorionic) membrane was noted separating the two  fetuses. ----------------------------------------------------------------------                   Johnell Comings, MD Electronically Signed Final Report   08/25/2022 11:37 am ----------------------------------------------------------------------  [place] MFM OB LIMITED  Result Date: 08/21/2022 ----------------------------------------------------------------------  OBSTETRICS REPORT                        (Signed Final 08/21/2022 01:23 pm) ---------------------------------------------------------------------- Patient Info  ID #:       [id number]                          D.O.B.:  Oct 15, 1995 (26 yrs)  Name:       Carol Paul                Visit Date: 08/21/2022 09:41 am ---------------------------------------------------------------------- Performed By  Attending:        Tama High MD        Ref. Address:      [place], [place]  Performed By:     Berlinda Last          Secondary Phy.:    [facility]                    RDMS                                                              Care  Referred By:      Sallyanne Havers               [facility]                    Harolyn Rutherford MD                                [place]  ---------------------------------------------------------------------- Orders  #  Description                           Code        Ordered By  1  [place] MFM OB LIMITED                     76815.01    Verita Schneiders  2  [place] MFM OB TRANSVAGINAL                [id number]     Verita Schneiders ----------------------------------------------------------------------  #  Order #                     Accession #                Episode #  1  [id number]                   [id number]                 [id number]  2  [id number]                   [id number]                 [id number] ---------------------------------------------------------------------- Indications  Twin pregnancy following elective fetal         O30.003, O31.30X0  reduction, antepartum  Twin pregnancy, di/di, second trimester         O30.042  [redacted] weeks gestation of pregnancy                 Z3A.16  Encounter for cervical length                   Z36.86 ---------------------------------------------------------------------- Fetal Evaluation (Fetus A)  Num Of Fetuses:          2  Fetal Heart Rate(bpm):  159  Cardiac Activity:        Observed  Fetal Lie:               Lower Fetus  Presentation:            Breech  Placenta:                Anterior  P. Cord Insertion:       Visualized  Membrane Desc:      Dividing Membrane seen - Dichorionic.  Amniotic Fluid  AFI FV:      Within normal limits                              Largest Pocket(cm)                              2 ---------------------------------------------------------------------- OB History  Gravidity:    1         Term:   0        Prem:   0        SAB:   0  TOP:          0       Ectopic:  0        Living: 0 ---------------------------------------------------------------------- Gestational Age (Fetus A)  Best:          16w 4d     Det. By:  Loman Chroman         EDD:   02/01/23 ---------------------------------------------------------------------- Anatomy (Fetus A)  Cranium:               Appears normal         Stomach:                 Appears normal, left                                                                        sided  Ventricles:            Appears normal         Bladder:                Appears normal  Choroid Plexus:        Appears normal ---------------------------------------------------------------------- Fetal Evaluation (Fetus B)  Num Of Fetuses:          2  Fetal Heart Rate(bpm):   134  Cardiac Activity:        Observed  Fetal Lie:               Upper Fetus  Presentation:            Transverse, head to maternal left  Placenta:                Anterior  P. Cord Insertion:       Visualized  Membrane Desc:      Dividing Membrane seen - Dichorionic.  Amniotic Fluid  AFI FV:      Within normal limits  Largest Pocket(cm)                              3.3 ---------------------------------------------------------------------- Gestational Age (Fetus B)  Best:          16w 4d     Det. By:  Loman Chroman         EDD:   02/01/23 ---------------------------------------------------------------------- Anatomy (Fetus B)  Cranium:               Appears normal         Stomach:                Appears normal, left                                                                        sided  Ventricles:            Appears normal         Spine:                  Appears normal  Choroid Plexus:        Appears normal ---------------------------------------------------------------------- Cervix Uterus Adnexa  Cervix  Length:            1.8  cm.  Measured transvaginally  Uterus  No abnormality visualized.  Right Ovary  Within normal limits.  Left Ovary  Within normal limits.  Adnexa  No abnormality visualized ---------------------------------------------------------------------- Impression  We confirmed a dichorionic-diamniotic twin pregnancy.  Twin A: Lower fetus, breech presentation, anterior placenta.  Amniotic fluid is normal and good fetal heart activity seen.  Twin B: Upper fetus, transverse lie and head to  maternal left,  anterior placenta.  Amniotic fluid is normal and good fetal  heart activity seen.  We performed a transvaginal ultrasound.  The cervix  measures 1.8 to 2.5 cm.  xxxxxxxxxxxxxxxxxxxxxxxxxxxxxxxxxxxxxxxxxxxxxxxxxxxxxxxxx  xxxxxxxxxxxxxxxxxxxxxxxxxxxxxxxxxxxxx  Consultation  I had the pleasure of seeing Ms. Lequire today at the [facility]-  Specialty Unit. She is G1 P0 at 16w 4d gestation with natural  triplet pregnancy with reduction of one fetus at 13-weeks'  gestation. Patient presented to the MAU yesterday evening  with complaints of abdominal pain and vaginal bleeding.  She had multifetal pregnancy reduction (from triplets to twins)  at [facility] at 13 weeks and 6 days gestation.  Triplet B was reduced.  Chorionic villous sampling was  performed on triplet A and C and karyotype was reported as  normal. Female and female fetuses were reported.  Pregnancy  was conceived by donor insemination at home.  The donor is  Caucasian and no genetic studies were performed.  She has  a same-sex partner with her.  4 days ago, patient presented to [facility] with  complaints of bleeding and the cervix was 2 cm dilated.  Yesterday she had severe abdominal pain and passed the  reduced fetus (planning to bury; declined pathology).  Patient  brought the fetus with her for confirmation.  Past medical history: No history of diabetes or hypertension  or any chronic medical conditions.  Past surgical history: Tonsillectomy, ankle surgery.  Medications: Prenatal vitamins, Phenergan.  Allergies: No known drug allergies.  Social history:  Denies tobacco or drug or alcohol use.  Family history: No history of venous thromboembolism in the  family.  GYN history: No history of abnormal Pap smears or cervical  surgeries.  P/E: Patient is comfortably lying in bed; not in distress.  Vitals: Stable; afebrile.  HEENT: Normal.  Abdomen: Soft; no tenderness in any of the quadrants; no  flank tenderness.  No pedal edema.  Labs:  Hemoglobin 10.5, hematocrit 30.7, WBC 23.2, platelets  352.  Electrolytes normal.  Creatinine 0.4.  AST 25, ALT 23.  Blood type A positive.  Triplet pregnancy with miscarriage of the reduced fetus.  Before multifetal pregnancy reduction procedure, the patient  was given an 8% chance of miscarriage of all fetuses from  the procedure.  I reassured the patient of normal heart  activities and the surviving fetuses.  The likelihood of miscarriage is little slightly higher but should  decrease with cessation of vaginal bleeding.  Cervical  shortening seen he is more likely from the miscarriage.  I recommended a follow-up transvaginal ultrasound next  week to evaluate the cervical length.  If significant cervical  shortening is seen (less than 1 cm), we will consider rescue  cerclage.  Patient had questions about vaginal progesterone.  delivery rates and twins.  However, at her recent study told it  may be beneficial after 32 weeks and twin pregnancy.  We do  not routinely recommend vaginal progesterone in twin  pregnancies.  Patient was told to return to the MAU if she has significant  bleeding or fever or severe abdominal pain.  [facility] and has a new OB appointment. ---------------------------------------------------------------------- Recommendations  -Transvaginal ultrasound on 08/30/2022 at 8:30 AM at [facility] [place].  -Fetal anatomy scan and cervical length measurement on  09/10/2022 at 11:15 AM.  -[facility] with [facility] ----------------------------------------------------------------------                  Tama High, MD Electronically Signed Final Report   08/21/2022 01:23 pm ----------------------------------------------------------------------  [place] MFM OB Transvaginal  Result Date: 08/21/2022 ----------------------------------------------------------------------  OBSTETRICS REPORT                         (Signed Final 08/21/2022 01:23 pm) ---------------------------------------------------------------------- Patient Info  ID #:       [id number]                          D.O.B.:  [birth year] (26 yrs)  Name:       Carol Paul                Visit Date: 08/21/2022 09:41 am ---------------------------------------------------------------------- Performed By  Attending:        Tama High MD        Ref. Address:      [address]                                                              [place], [place]  [id number]  Performed By:     Berlinda Last          Secondary Phy.:    [facility]                    RDMS                                                              Care  Referred By:      Sallyanne Havers               Location[facility]                    Harolyn Rutherford MD                                [place] ---------------------------------------------------------------------- Orders  #  Description                           Code        Ordered By  1  [place] MFM OB LIMITED                     76815.01    Verita Schneiders  2  [place] MFM OB TRANSVAGINAL                [id number]     Verita Schneiders ----------------------------------------------------------------------  #  Order #                     Accession #                Episode #  1  [id number]                   [id number]                 [id number]  2  [id number]                   [id number]                 [id number] ---------------------------------------------------------------------- Indications  Twin pregnancy following elective fetal         O30.003, O31.30X0  reduction, antepartum  Twin pregnancy, di/di, second trimester         O30.042  [redacted] weeks gestation of pregnancy                 Z3A.16  Encounter for cervical length                   Z36.86 ---------------------------------------------------------------------- Fetal Evaluation (Fetus A)  Num Of Fetuses:          2  Fetal Heart Rate(bpm):   159   Cardiac Activity:        Observed  Fetal Lie:               Lower Fetus  Presentation:            Breech  Placenta:  Anterior  P. Cord Insertion:       Visualized  Membrane Desc:      Dividing Membrane seen - Dichorionic.  Amniotic Fluid  AFI FV:      Within normal limits                              Largest Pocket(cm)                              2 ---------------------------------------------------------------------- OB History  Gravidity:    1         Term:   0        Prem:   0        SAB:   0  TOP:          0       Ectopic:  0        Living: 0 ---------------------------------------------------------------------- Gestational Age (Fetus A)  Best:          16w 4d     Det. By:  Loman Chroman         EDD:   02/01/23 ---------------------------------------------------------------------- Anatomy (Fetus A)  Cranium:               Appears normal         Stomach:                Appears normal, left                                                                        sided  Ventricles:            Appears normal         Bladder:                Appears normal  Choroid Plexus:        Appears normal ---------------------------------------------------------------------- Fetal Evaluation (Fetus B)  Num Of Fetuses:          2  Fetal Heart Rate(bpm):   134  Cardiac Activity:        Observed  Fetal Lie:               Upper Fetus  Presentation:            Transverse, head to maternal left  Placenta:                Anterior  P. Cord Insertion:       Visualized  Membrane Desc:      Dividing Membrane seen - Dichorionic.  Amniotic Fluid  AFI FV:      Within normal limits                              Largest Pocket(cm)                              3.3 ---------------------------------------------------------------------- Gestational Age (Fetus B)  Best:          16w 4d     Det.  By:  Loman Chroman         EDD:   02/01/23 ---------------------------------------------------------------------- Anatomy (Fetus B)  Cranium:                Appears normal         Stomach:                Appears normal, left                                                                        sided  Ventricles:            Appears normal         Spine:                  Appears normal  Choroid Plexus:        Appears normal ---------------------------------------------------------------------- Cervix Uterus Adnexa  Cervix  Length:            1.8  cm.  Measured transvaginally  Uterus  No abnormality visualized.  Right Ovary  Within normal limits.  Left Ovary  Within normal limits.  Adnexa  No abnormality visualized ---------------------------------------------------------------------- Impression  We confirmed a dichorionic-diamniotic twin pregnancy.  Twin A: Lower fetus, breech presentation, anterior placenta.  Amniotic fluid is normal and good fetal heart activity seen.  Twin B: Upper fetus, transverse lie and head to maternal left,  anterior placenta.  Amniotic fluid is normal and good fetal  heart activity seen.  We performed a transvaginal ultrasound.  The cervix  measures 1.8 to 2.5 cm.  xxxxxxxxxxxxxxxxxxxxxxxxxxxxxxxxxxxxxxxxxxxxxxxxxxxxxxxxx  xxxxxxxxxxxxxxxxxxxxxxxxxxxxxxxxxxxxx  Consultation  I had the pleasure of seeing Ms. Hinkson today at the [facility]  Specialty Unit. She is G1 P0 at 16w 4d gestation with natural  triplet pregnancy with reduction of one fetus at 13-weeks'  gestation. Patient presented to the MAU yesterday evening  with complaints of abdominal pain and vaginal bleeding.  She had multifetal pregnancy reduction (from triplets to twins)  at [facility] at 13 weeks and 6 days gestation.  Triplet B was reduced.  Chorionic villous sampling was  performed on triplet A and C and karyotype was reported as  normal. Female and female fetuses were reported.  Pregnancy  was conceived by donor insemination at home.  The donor is  Caucasian and no genetic studies were performed.  She has  a same-sex partner with her.  4 days ago, patient  presented to [facility] with  complaints of bleeding and the cervix was 2 cm dilated.  Yesterday she had severe abdominal pain and passed the  reduced fetus (planning to bury; declined pathology).  Patient  brought the fetus with her for confirmation.  Past medical history: No history of diabetes or hypertension  or any chronic medical conditions.  Past surgical history: Tonsillectomy, ankle surgery.  Medications: Prenatal vitamins, Phenergan.  Allergies: No known drug allergies.  Social history: Denies tobacco or drug or alcohol use.  Family history: No history of venous thromboembolism in the  family.  GYN history: No history of abnormal Pap smears or cervical  surgeries.  P/E: Patient is comfortably lying in bed; not in distress.  Vitals: Stable; afebrile.  HEENT: Normal.  Abdomen: Soft; no  tenderness in any of the quadrants; no  flank tenderness.  No pedal edema.  Labs: Hemoglobin 10.5, hematocrit 30.7, WBC 23.2, platelets  352.  Electrolytes normal.  Creatinine 0.4.  AST 25, ALT 23.  Blood type A positive.  Triplet pregnancy with miscarriage of the reduced fetus.  Before multifetal pregnancy reduction procedure, the patient  was given an 8% chance of miscarriage of all fetuses from  the procedure.  I reassured the patient of normal heart  activities and the surviving fetuses.  The likelihood of miscarriage is little slightly higher but should  decrease with cessation of vaginal bleeding.  Cervical  shortening seen he is more likely from the miscarriage.  I recommended a follow-up transvaginal ultrasound next  week to evaluate the cervical length.  If significant cervical  shortening is seen (less than 1 cm), we will consider rescue  cerclage.  Patient had questions about vaginal progesterone.  delivery rates and twins.  However, at her recent study told it  may be beneficial after 32 weeks and twin pregnancy.  We do  not routinely recommend vaginal progesterone in twin  pregnancies.  Patient was told  to return to the MAU if she has significant  bleeding or fever or severe abdominal pain.  [facility] and has a new OB appointment. ---------------------------------------------------------------------- Recommendations  -Transvaginal ultrasound on 08/30/2022 at 8:30 AM at [facility] [place].  -Fetal anatomy scan and cervical length measurement on  09/10/2022 at 11:15 AM.  -[facility]. ----------------------------------------------------------------------                  Tama High, MD Electronically Signed Final Report   08/21/2022 01:23 pm ----------------------------------------------------------------------   Future Appointments  Date Time Provider [place]  09/09/2022  9:55 AM Griffin Basil, MD [facility]  09/10/2022  1:15 PM [facility] NURSE [facility]  09/10/2022  1:30 PM [facility] US2 [facility]    Discharge Condition: Stable  Discharge disposition: 01-Home or Self Care        Allergies as of 08/29/2022       Reactions   Other Other (See Comments)   GI discomfort to wheat, soy, eggs, milk and sesame        Medication List     STOP taking these medications    ondansetron 4 MG disintegrating tablet Commonly known as: ZOFRAN-ODT       TAKE these medications    albuterol 108 (90 Base) MCG/ACT inhaler Commonly known as: VENTOLIN HFA Inhale 1-2 puffs into the lungs every 6 (six) hours as needed for wheezing or shortness of breath.   docusate sodium 100 MG capsule Commonly known as: COLACE Take 1 capsule (100 mg total) by mouth daily. Start taking on: August 30, 2022   ferrous sulfate 325 (65 FE) MG tablet Take 1 tablet (325 mg total) by mouth every other day. Start taking on: August 30, 2022 What changed:  when to take this additional instructions   hydrOXYzine 25 MG tablet Commonly known as: ATARAX Take 1 tablet (25 mg total) by mouth 3  (three) times daily as needed for anxiety.   prochlorperazine 10 MG tablet Commonly known as: COMPAZINE Take 1 tablet (10 mg total) by mouth every 6 (six) hours as needed for nausea or vomiting.   progesterone 200 MG capsule Commonly known as: PROMETRIUM Place 1 capsule (200 mg total) vaginally at bedtime. What changed:  how  to take this when to take this   promethazine 25 MG suppository Commonly known as: PHENERGAN Place 1 suppository (25 mg total) rectally every 8 (eight) hours as needed for up to 12 doses for nausea or vomiting.         Total discharge time: 30 minutes   Signed: Verita Schneiders M.D. 08/29/2022, 12:23 PM

## 2022-08-29 NOTE — Progress Notes (Signed)
Pt discharged in stable condition. All belongings sent with patient.

## 2022-08-29 NOTE — Progress Notes (Signed)
Lily Kocher, RN just notified me that patient is requesting to be discharged to home now.   Discharge order and instructions entered as per her request, precautions reviewed with patient.   Verita Schneiders, MD

## 2022-08-29 NOTE — Progress Notes (Signed)
Patient ID: ROSELLA SPRAYBERRY, female   DOB: Jun 11, 1996, 27 y.o.   MRN: [id number] FACULTY PRACTICE ANTEPARTUM(COMPREHENSIVE) NOTE  Daneisha J Erdman is a 27 y.o. G1P0000 at 47w5dwho is admitted for observation abdominal cramping and significant vaginal bleeding needing transfusion.  Patient is s/p delivery of demised fetus; patient had elective reduction from triplet to twin gestation around [redacted] weeks gestation.   Length of Stay:  4  Days  Subjective: Patient reports that she had another episode of bleeding and passing a clot overnight. Also had some cramping pain or contractions. She does not want to go home unless she has 24 hours without significant bleeding. Patient denies any chest pain or difficulty breathing Patient reports the fetal movement as active. Patient describes fluid per vagina as none.  Vitals:  Blood pressure 110/65, pulse (!) 110, temperature 98.4 F (36.9 C), temperature source Oral, resp. rate 16, height 5' 2"$  (1.575 m), weight 90.7 kg, SpO2 98 %. Physical Examination: General appearance - alert, well appearing, and in no distress Heart - normal rate and regular rhythm Abdomen - soft, nontender, nondistended Cervical Exam: Not evaluated.  Extremities: extremities normal, atraumatic, no cyanosis or edema and Homans sign is negative, no sign of DVT  Membranes:intact  Fetal Monitoring:   168, 176 this morning  Labs:     Latest Ref Rng & Units 08/28/2022    6:17 PM 08/28/2022    5:20 AM 08/27/2022    8:43 AM  CBC  WBC 4.0 - 10.5 K/uL 17.6  14.7  13.9   Hemoglobin 12.0 - 15.0 g/dL 10.3  7.8  9.0   Hematocrit 36.0 - 46.0 % 28.8  22.9  27.2   Platelets 150 - 400 K/uL 279  262  291   Was transfused 2 pRBCs on 08/28/22  Medications:  Scheduled  docusate sodium  100 mg Oral Daily   ferrous sulfate  325 mg Oral QODAY   ketorolac  30 mg Intravenous Q6H   mometasone-formoterol  2 puff Inhalation BID   prenatal multivitamin  1 tablet Oral Q1200   progesterone  200 mg  Vaginal QHS   I have reviewed the patient's current medications.  ASSESSMENT: Patient Active Problem List   Diagnosis Date Noted   Vaginal bleeding in pregnancy, second trimester 08/24/2022   Twin pregnancy following selective fetal reduction in second trimester 08/21/2022   Anxiety 08/21/2022   [redacted] weeks gestation of pregnancy 08/21/2022   MDD (major depressive disorder) 11/26/2016    PLAN: 27yo P0 at 131w5dith triplet pregnancy recently reduced to di-di twin pregnancy admitted with abdominal cramping and vaginal bleeding  WBC elevated this morning. Will continue to monitor for other signs/symptoms of possible chorioamnionitis  Stable hemoglobin 10.3 after transfusion, will check again on 08/30/22  Patient desires to be discharged home only if she has 24 hours without significant bleeding or concerning cramping.  She refuses to discontinue IV fluids as she feels this helps her cramping.   Will continue antenatal inpatient observation for now.   UgVerita SchneidersMD ObBroad[facility] ParkFaRuss[facility] Hospitalor [facility] Company[facility]

## 2022-08-29 NOTE — Progress Notes (Signed)
Discharge instructions given to patient. Pt verbalized understanding and all questions answered. IV discontinued. Pt awaiting ride home.

## 2022-08-30 ENCOUNTER — Inpatient Hospital Stay (HOSPITAL_COMMUNITY): Payer: Medicaid Other

## 2022-08-30 ENCOUNTER — Inpatient Hospital Stay (HOSPITAL_COMMUNITY): Payer: Medicaid Other | Admitting: Anesthesiology

## 2022-08-30 ENCOUNTER — Ambulatory Visit: Payer: Medicaid Other

## 2022-08-30 ENCOUNTER — Encounter (HOSPITAL_COMMUNITY): Payer: Self-pay | Admitting: Obstetrics and Gynecology

## 2022-08-30 ENCOUNTER — Inpatient Hospital Stay (HOSPITAL_COMMUNITY)
Admission: AD | Admit: 2022-08-30 | Discharge: 2022-08-31 | DRG: 779 | Disposition: A | Discharge status: Home / Self Care | Payer: Medicaid Other | Attending: Obstetrics & Gynecology | Admitting: Obstetrics & Gynecology

## 2022-08-30 ENCOUNTER — Other Ambulatory Visit: Payer: Self-pay

## 2022-08-30 DIAGNOSIS — O26892 Other specified pregnancy related conditions, second trimester: Secondary | ICD-10-CM

## 2022-08-30 DIAGNOSIS — O479 False labor, unspecified: Secondary | ICD-10-CM

## 2022-08-30 DIAGNOSIS — O4592 Premature separation of placenta, unspecified, second trimester: Secondary | ICD-10-CM

## 2022-08-30 DIAGNOSIS — O4692 Antepartum hemorrhage, unspecified, second trimester: Secondary | ICD-10-CM | POA: Diagnosis not present

## 2022-08-30 DIAGNOSIS — O021 Missed abortion: Secondary | ICD-10-CM | POA: Diagnosis present

## 2022-08-30 DIAGNOSIS — O411220 Chorioamnionitis, second trimester, not applicable or unspecified: Secondary | ICD-10-CM | POA: Diagnosis present

## 2022-08-30 DIAGNOSIS — O4690 Antepartum hemorrhage, unspecified, unspecified trimester: Secondary | ICD-10-CM | POA: Diagnosis present

## 2022-08-30 DIAGNOSIS — R102 Pelvic and perineal pain: Secondary | ICD-10-CM | POA: Diagnosis not present

## 2022-08-30 DIAGNOSIS — O3132X1 Continuing pregnancy after elective fetal reduction of one fetus or more, second trimester, fetus 1: Secondary | ICD-10-CM

## 2022-08-30 DIAGNOSIS — Z3A17 17 weeks gestation of pregnancy: Secondary | ICD-10-CM

## 2022-08-30 DIAGNOSIS — O3132X3 Continuing pregnancy after elective fetal reduction of one fetus or more, second trimester, fetus 3: Secondary | ICD-10-CM

## 2022-08-30 DIAGNOSIS — O209 Hemorrhage in early pregnancy, unspecified: Secondary | ICD-10-CM

## 2022-08-30 DIAGNOSIS — O30042 Twin pregnancy, dichorionic/diamniotic, second trimester: Principal | ICD-10-CM | POA: Diagnosis present

## 2022-08-30 DIAGNOSIS — O30102 Triplet pregnancy, unspecified number of placenta and unspecified number of amniotic sacs, second trimester: Secondary | ICD-10-CM | POA: Diagnosis not present

## 2022-08-30 DIAGNOSIS — Z87891 Personal history of nicotine dependence: Secondary | ICD-10-CM | POA: Diagnosis not present

## 2022-08-30 DIAGNOSIS — O411230 Chorioamnionitis, third trimester, not applicable or unspecified: Secondary | ICD-10-CM

## 2022-08-30 DIAGNOSIS — R109 Unspecified abdominal pain: Secondary | ICD-10-CM | POA: Diagnosis present

## 2022-08-30 LAB — CBC
HCT: 29.9 % — ABNORMAL LOW (ref 36.0–46.0)
HCT: 34.9 % — ABNORMAL LOW (ref 36.0–46.0)
HCT: 30.5 % — ABNORMAL LOW (ref 36.0–46.0)
Hemoglobin: 9.9 g/dL — ABNORMAL LOW (ref 12.0–15.0)
Hemoglobin: 11.5 g/dL — ABNORMAL LOW (ref 12.0–15.0)
Hemoglobin: 10.9 g/dL — ABNORMAL LOW (ref 12.0–15.0)
MCH: 31.2 pg (ref 26.0–34.0)
MCH: 30.6 pg (ref 26.0–34.0)
MCH: 30.1 pg (ref 26.0–34.0)
MCHC: 33.1 g/dL (ref 30.0–36.0)
MCHC: 33 g/dL (ref 30.0–36.0)
MCHC: 35.7 g/dL (ref 30.0–36.0)
MCV: 94.3 fL (ref 80.0–100.0)
MCV: 92.8 fL (ref 80.0–100.0)
MCV: 84.3 fL (ref 80.0–100.0)
Platelets: 325 10*3/uL (ref 150–400)
Platelets: 282 10*3/uL (ref 150–400)
Platelets: 204 10*3/uL (ref 150–400)
RBC: 3.17 MIL/uL — ABNORMAL LOW (ref 3.87–5.11)
RBC: 3.76 MIL/uL — ABNORMAL LOW (ref 3.87–5.11)
RBC: 3.62 MIL/uL — ABNORMAL LOW (ref 3.87–5.11)
RDW: 13.5 % (ref 11.5–15.5)
RDW: 14.9 % (ref 11.5–15.5)
RDW: 16.5 % — ABNORMAL HIGH (ref 11.5–15.5)
WBC: 19.2 10*3/uL — ABNORMAL HIGH (ref 4.0–10.5)
WBC: 25.1 10*3/uL — ABNORMAL HIGH (ref 4.0–10.5)
WBC: 20.6 10*3/uL — ABNORMAL HIGH (ref 4.0–10.5)
nRBC: 0 % (ref 0.0–0.2)
nRBC: 0 % (ref 0.0–0.2)
nRBC: 0 % (ref 0.0–0.2)

## 2022-08-30 LAB — DIC (DISSEMINATED INTRAVASCULAR COAGULATION)PANEL
D-Dimer, Quant: 2.16 ug/mL-FEU — ABNORMAL HIGH (ref 0.00–0.50)
D-Dimer, Quant: 9.63 ug/mL-FEU — ABNORMAL HIGH (ref 0.00–0.50)
Fibrinogen: 686 mg/dL — ABNORMAL HIGH (ref 210–475)
Fibrinogen: 617 mg/dL — ABNORMAL HIGH (ref 210–475)
INR: 1 (ref 0.8–1.2)
INR: 1.1 (ref 0.8–1.2)
Platelets: 331 10*3/uL (ref 150–400)
Platelets: 274 10*3/uL (ref 150–400)
Prothrombin Time: 13.4 seconds (ref 11.4–15.2)
Prothrombin Time: 13.7 seconds (ref 11.4–15.2)
Smear Review: NONE SEEN
Smear Review: NONE SEEN
aPTT: 28 seconds (ref 24–36)
aPTT: 26 seconds (ref 24–36)

## 2022-08-30 LAB — PREPARE RBC (CROSSMATCH)

## 2022-08-30 MED ORDER — COCONUT OIL OIL: 1.0000 | TOPICAL_OIL | Status: DC | PRN

## 2022-08-30 MED ORDER — METHYLERGONOVINE MALEATE 0.2 MG/ML IJ SOLN: 0.2000 mg | INTRAMUSCULAR | Status: DC | PRN

## 2022-08-30 MED ORDER — KETOROLAC TROMETHAMINE 30 MG/ML IJ SOLN
30.0000 mg | Freq: Once | INTRAMUSCULAR | Status: DC
  Filled 2022-08-30: qty 1

## 2022-08-30 MED ORDER — SOD CITRATE-CITRIC ACID 500-334 MG/5ML PO SOLN: 30.0000 mL | ORAL | Status: DC | PRN

## 2022-08-30 MED ORDER — ONDANSETRON 4 MG PO TBDP
4.0000 mg | ORAL_TABLET | Freq: Once | ORAL | Status: DC
  Filled 2022-08-30: qty 1

## 2022-08-30 MED ORDER — ONDANSETRON HCL 4 MG/2ML IJ SOLN: 4.0000 mg | INTRAMUSCULAR | Status: DC | PRN

## 2022-08-30 MED ORDER — SODIUM CHLORIDE 0.9% IV SOLUTION: Freq: Once | INTRAVENOUS | Status: DC

## 2022-08-30 MED ORDER — PIPERACILLIN-TAZOBACTAM 3.375 G IVPB 30 MIN
3.3750 g | Freq: Once | INTRAVENOUS | Status: AC
  Administered 2022-08-30: 3.375 g via INTRAVENOUS
  Filled 2022-08-30: qty 50

## 2022-08-30 MED ORDER — ACETAMINOPHEN 325 MG PO TABS: 650.0000 mg | ORAL_TABLET | ORAL | Status: DC | PRN

## 2022-08-30 MED ORDER — ZOLPIDEM TARTRATE 5 MG PO TABS
5.0000 mg | ORAL_TABLET | Freq: Every evening | ORAL | Status: DC | PRN
  Administered 2022-08-31: 5 mg via ORAL
  Filled 2022-08-30: qty 1

## 2022-08-30 MED ORDER — EPHEDRINE 5 MG/ML INJ: 10.0000 mg | INTRAVENOUS | Status: DC | PRN

## 2022-08-30 MED ORDER — FENTANYL-BUPIVACAINE-NACL 0.5-0.125-0.9 MG/250ML-% EP SOLN
EPIDURAL | Status: DC | PRN
  Administered 2022-08-30: 12 mL/h via EPIDURAL

## 2022-08-30 MED ORDER — FENTANYL CITRATE (PF) 100 MCG/2ML IJ SOLN
INTRAMUSCULAR | Status: AC
  Filled 2022-08-30: qty 2

## 2022-08-30 MED ORDER — BENZOCAINE-MENTHOL 20-0.5 % EX AERO: 1.0000 | INHALATION_SPRAY | CUTANEOUS | Status: DC | PRN

## 2022-08-30 MED ORDER — IBUPROFEN 600 MG PO TABS
ORAL_TABLET | ORAL | Status: AC
  Administered 2022-08-30: 600 mg via ORAL
  Filled 2022-08-30: qty 1

## 2022-08-30 MED ORDER — ONDANSETRON HCL 4 MG/2ML IJ SOLN: 4.0000 mg | Freq: Four times a day (QID) | INTRAMUSCULAR | Status: DC | PRN

## 2022-08-30 MED ORDER — LACTATED RINGERS IV SOLN
500.0000 mL | INTRAVENOUS | Status: DC | PRN
  Administered 2022-08-30: 1000 mL via INTRAVENOUS

## 2022-08-30 MED ORDER — OXYTOCIN-SODIUM CHLORIDE 30-0.9 UT/500ML-% IV SOLN
2.5000 [IU]/h | INTRAVENOUS | Status: DC
  Administered 2022-08-30: 2.5 [IU]/h via INTRAVENOUS
  Filled 2022-08-30: qty 500

## 2022-08-30 MED ORDER — METHYLERGONOVINE MALEATE 0.2 MG PO TABS: 0.2000 mg | ORAL_TABLET | ORAL | Status: DC | PRN

## 2022-08-30 MED ORDER — PHENYLEPHRINE 80 MCG/ML (10ML) SYRINGE FOR IV PUSH (FOR BLOOD PRESSURE SUPPORT)
80.0000 ug | PREFILLED_SYRINGE | INTRAVENOUS | Status: DC | PRN
  Administered 2022-08-30: 80 ug via INTRAVENOUS

## 2022-08-30 MED ORDER — HYDROMORPHONE HCL 1 MG/ML IJ SOLN
INTRAMUSCULAR | Status: AC
  Filled 2022-08-30: qty 1

## 2022-08-30 MED ORDER — HYDROMORPHONE HCL 1 MG/ML IJ SOLN
1.0000 mg | Freq: Once | INTRAMUSCULAR | Status: AC
  Administered 2022-08-30: 1 mg via INTRAMUSCULAR

## 2022-08-30 MED ORDER — SIMETHICONE 80 MG PO CHEW: 80.0000 mg | CHEWABLE_TABLET | ORAL | Status: DC | PRN

## 2022-08-30 MED ORDER — PIPERACILLIN-TAZOBACTAM 3.375 G IVPB 30 MIN
3.3750 g | Freq: Three times a day (TID) | INTRAVENOUS | Status: DC
  Administered 2022-08-30: 3.375 g via INTRAVENOUS
  Filled 2022-08-30 (×3): qty 50

## 2022-08-30 MED ORDER — ACETAMINOPHEN 500 MG PO TABS: 1000.0000 mg | ORAL_TABLET | Freq: Four times a day (QID) | ORAL | Status: DC | PRN

## 2022-08-30 MED ORDER — ACETAMINOPHEN 500 MG PO TABS
ORAL_TABLET | ORAL | Status: AC
  Administered 2022-08-30: 1000 mg via ORAL
  Filled 2022-08-30: qty 2

## 2022-08-30 MED ORDER — PHENYLEPHRINE 80 MCG/ML (10ML) SYRINGE FOR IV PUSH (FOR BLOOD PRESSURE SUPPORT)
80.0000 ug | PREFILLED_SYRINGE | INTRAVENOUS | Status: DC | PRN
  Filled 2022-08-30: qty 10

## 2022-08-30 MED ORDER — IBUPROFEN 600 MG PO TABS: 600.0000 mg | ORAL_TABLET | Freq: Once | ORAL | Status: AC

## 2022-08-30 MED ORDER — LORAZEPAM 2 MG/ML IJ SOLN
0.5000 mg | Freq: Once | INTRAMUSCULAR | Status: AC
  Administered 2022-08-30: 0.5 mg via INTRAVENOUS
  Filled 2022-08-30: qty 1

## 2022-08-30 MED ORDER — FENTANYL-BUPIVACAINE-NACL 0.5-0.125-0.9 MG/250ML-% EP SOLN: 12.0000 mL/h | EPIDURAL | Status: DC | PRN

## 2022-08-30 MED ORDER — EPHEDRINE 5 MG/ML INJ
10.0000 mg | INTRAVENOUS | Status: DC | PRN
  Filled 2022-08-30: qty 5

## 2022-08-30 MED ORDER — TETANUS-DIPHTH-ACELL PERTUSSIS 5-2.5-18.5 LF-MCG/0.5 IM SUSY: 0.5000 mL | PREFILLED_SYRINGE | Freq: Once | INTRAMUSCULAR | Status: DC

## 2022-08-30 MED ORDER — ONDANSETRON HCL 4 MG/2ML IJ SOLN
4.0000 mg | Freq: Once | INTRAMUSCULAR | Status: AC
  Administered 2022-08-30: 4 mg via INTRAVENOUS
  Filled 2022-08-30: qty 2

## 2022-08-30 MED ORDER — IBUPROFEN 600 MG PO TABS
600.0000 mg | ORAL_TABLET | Freq: Four times a day (QID) | ORAL | Status: DC
  Administered 2022-08-30 – 2022-08-31 (×2): 600 mg via ORAL
  Filled 2022-08-30 (×2): qty 1

## 2022-08-30 MED ORDER — PRENATAL MULTIVITAMIN CH: 1.0000 | ORAL_TABLET | Freq: Every day | ORAL | Status: DC

## 2022-08-30 MED ORDER — FENTANYL-BUPIVACAINE-NACL 0.5-0.125-0.9 MG/250ML-% EP SOLN
EPIDURAL | Status: AC
  Filled 2022-08-30: qty 250

## 2022-08-30 MED ORDER — WITCH HAZEL-GLYCERIN EX PADS: 1.0000 | MEDICATED_PAD | CUTANEOUS | Status: DC | PRN

## 2022-08-30 MED ORDER — LIDOCAINE HCL (PF) 1 % IJ SOLN
INTRAMUSCULAR | Status: DC | PRN
  Administered 2022-08-30: 4 mL via EPIDURAL

## 2022-08-30 MED ORDER — FENTANYL CITRATE (PF) 100 MCG/2ML IJ SOLN
100.0000 ug | INTRAMUSCULAR | Status: DC | PRN
  Filled 2022-08-30: qty 2

## 2022-08-30 MED ORDER — DIPHENHYDRAMINE HCL 50 MG/ML IJ SOLN: 12.5000 mg | INTRAMUSCULAR | Status: DC | PRN

## 2022-08-30 MED ORDER — DIBUCAINE (PERIANAL) 1 % EX OINT: 1.0000 | TOPICAL_OINTMENT | CUTANEOUS | Status: DC | PRN

## 2022-08-30 MED ORDER — ACETAMINOPHEN 325 MG PO TABS
650.0000 mg | ORAL_TABLET | ORAL | Status: DC | PRN
  Administered 2022-08-31: 650 mg via ORAL
  Filled 2022-08-30: qty 2

## 2022-08-30 MED ORDER — ONDANSETRON HCL 4 MG PO TABS: 4.0000 mg | ORAL_TABLET | ORAL | Status: DC | PRN

## 2022-08-30 MED ORDER — KETOROLAC TROMETHAMINE 30 MG/ML IJ SOLN
30.0000 mg | Freq: Once | INTRAMUSCULAR | Status: AC
  Administered 2022-08-30: 30 mg via INTRAVENOUS

## 2022-08-30 MED ORDER — FENTANYL CITRATE (PF) 100 MCG/2ML IJ SOLN: 50.0000 ug | INTRAMUSCULAR | Status: DC | PRN

## 2022-08-30 MED ORDER — SENNOSIDES-DOCUSATE SODIUM 8.6-50 MG PO TABS
2.0000 | ORAL_TABLET | Freq: Every day | ORAL | Status: DC
  Filled 2022-08-30: qty 2

## 2022-08-30 MED ORDER — TRANEXAMIC ACID-NACL 1000-0.7 MG/100ML-% IV SOLN
1000.0000 mg | INTRAVENOUS | Status: AC
  Administered 2022-08-30: 1000 mg via INTRAVENOUS
  Filled 2022-08-30: qty 100

## 2022-08-30 MED ORDER — DIPHENHYDRAMINE HCL 25 MG PO CAPS: 25.0000 mg | ORAL_CAPSULE | Freq: Four times a day (QID) | ORAL | Status: DC | PRN

## 2022-08-30 MED ORDER — METHYLERGONOVINE MALEATE 0.2 MG/ML IJ SOLN
INTRAMUSCULAR | Status: AC
  Administered 2022-08-30: 0.2 mg
  Filled 2022-08-30: qty 1

## 2022-08-30 MED ORDER — LACTATED RINGERS IV SOLN
500.0000 mL | Freq: Once | INTRAVENOUS | Status: AC
  Administered 2022-08-30: 500 mL via INTRAVENOUS

## 2022-08-30 MED ORDER — FENTANYL CITRATE (PF) 100 MCG/2ML IJ SOLN
100.0000 ug | Freq: Once | INTRAMUSCULAR | Status: AC
  Administered 2022-08-30: 100 ug via INTRAVENOUS

## 2022-08-30 MED ORDER — MISOPROSTOL 200 MCG PO TABS
400.0000 ug | ORAL_TABLET | ORAL | Status: DC
  Administered 2022-08-30: 400 ug via BUCCAL
  Filled 2022-08-30: qty 2

## 2022-08-30 MED ORDER — OXYTOCIN BOLUS FROM INFUSION
333.0000 mL | Freq: Once | INTRAVENOUS | Status: AC
  Administered 2022-08-30: 333 mL via INTRAVENOUS

## 2022-08-30 MED ORDER — SODIUM CHLORIDE 0.9 % IV SOLN: INTRAVENOUS | Status: DC

## 2022-08-30 MED ORDER — PHENYLEPHRINE 80 MCG/ML (10ML) SYRINGE FOR IV PUSH (FOR BLOOD PRESSURE SUPPORT)
PREFILLED_SYRINGE | INTRAVENOUS | Status: AC
  Administered 2022-08-30: 80 ug via INTRAVENOUS
  Filled 2022-08-30: qty 10

## 2022-08-30 MED ORDER — LIDOCAINE HCL (PF) 1 % IJ SOLN: 30.0000 mL | INTRAMUSCULAR | Status: DC | PRN

## 2022-08-30 MED ORDER — LACTATED RINGERS IV SOLN: INTRAVENOUS | Status: DC

## 2022-08-30 NOTE — MAU Provider Note (Signed)
Chief Complaint:  Abdominal Pain and Vaginal Bleeding   Event Date/Time   First Provider Initiated Contact with Patient 08/30/22 0540   HPI: Carol Paul is a 27 y.o. G1P0000 at 82w6dho presents to maternity admissions reporting onset of severe cramping and heavy bleeding at about 5am.  States had a little spotting at 4 but soaked pads an hour later with increase in cramping.  Patient is s/p a selective reduction of triplet pregnancy (triplet B) to twins.  Was just discharged yesterday after having heavy bleeding over the past week. She required a transfusion of 3 units PRBC while here.. She reports good fetal movement, denies LOF, vaginal bleeding, vaginal itching/burning, urinary symptoms, h/a, dizziness, n/v, diarrhea, constipation or fever/chills.  She denies headache, visual changes or RUQ abdominal pain.  Abdominal Pain This is a recurrent problem. The current episode started today. The problem occurs constantly. The pain is located in the LLQ, RLQ and suprapubic region. The pain is severe. The quality of the pain is cramping. Associated symptoms include nausea. Pertinent negatives include no fever. Nothing aggravates the pain. The pain is relieved by Nothing. She has tried nothing for the symptoms.  Vaginal Bleeding The patient's primary symptoms include pelvic pain and vaginal bleeding. The patient's pertinent negatives include no genital odor. This is a recurrent problem. The current episode started today. The problem occurs constantly. The problem has been gradually worsening. The pain is severe. She is pregnant. Associated symptoms include abdominal pain and nausea. Pertinent negatives include no fever. The vaginal discharge was bloody. The vaginal bleeding is heavier than menses. She has been passing clots. Nothing aggravates the symptoms. She has tried nothing for the symptoms.    Past Medical History: Past Medical History:  Diagnosis Date   Asthma     Past obstetric  history: OB History  Gravida Para Term Preterm AB Living  1 0 0 0 0 0  SAB IAB Ectopic Multiple Live Births  0 0 0 0 0    # Outcome Date GA Lbr Len/2nd Weight Sex Delivery Anes PTL Lv  1 Current             Past Surgical History: Past Surgical History:  Procedure Laterality Date   TONSILLECTOMY      Family History: Family History  Problem Relation Age of Onset   CAD Other    Diabetes Mother     Social History: Social History   Tobacco Use   Smoking status: Former    Passive exposure: Current   Smokeless tobacco: Never  Vaping Use   Vaping Use: Every day   Substances: Nicotine, THC, Flavoring  Substance Use Topics   Alcohol use: Not Currently    Comment: social   Drug use: Not Currently    Types: Marijuana    Allergies:  Allergies  Allergen Reactions   Other Other (See Comments)    GI discomfort to wheat, soy, eggs, milk and sesame    Meds:  Medications Prior to Admission  Medication Sig Dispense Refill Last Dose   albuterol (VENTOLIN HFA) 108 (90 Base) MCG/ACT inhaler Inhale 1-2 puffs into the lungs every 6 (six) hours as needed for wheezing or shortness of breath. 8 g 0    docusate sodium (COLACE) 100 MG capsule Take 1 capsule (100 mg total) by mouth daily. 10 capsule 0    ferrous sulfate 325 (65 FE) MG tablet Take 1 tablet (325 mg total) by mouth every other day. 30 tablet 3    hydrOXYzine (ATARAX)  25 MG tablet Take 1 tablet (25 mg total) by mouth 3 (three) times daily as needed for anxiety. 60 tablet 2    prochlorperazine (COMPAZINE) 10 MG tablet Take 1 tablet (10 mg total) by mouth every 6 (six) hours as needed for nausea or vomiting. 30 tablet 0    progesterone (PROMETRIUM) 200 MG capsule Place 1 capsule (200 mg total) vaginally at bedtime. 30 capsule 3    promethazine (PHENERGAN) 25 MG suppository Place 1 suppository (25 mg total) rectally every 8 (eight) hours as needed for up to 12 doses for nausea or vomiting. 12 each 0     I have reviewed  patient's Past Medical Hx, Surgical Hx, Family Hx, Social Hx, medications and allergies.   ROS:  Review of Systems  Constitutional:  Negative for fever.  Gastrointestinal:  Positive for abdominal pain and nausea.  Genitourinary:  Positive for pelvic pain and vaginal bleeding.   Other systems negative  Physical Exam  Patient Vitals for the past 24 hrs:  BP Temp Temp src Pulse Resp SpO2  08/30/22 0540 127/80 98.4 F (36.9 C) Oral (!) 113 20 95 %   Constitutional: Well-developed, well-nourished female in significant pain, writhing and moaning  Cardiovascular: normal rate Respiratory: normal effort GI: Abd soft, non-tender, gravid appropriate for gestational age.   No rebound or guarding. MS: Extremities nontender, no edema, normal ROM Neurologic: Alert and oriented x 4.  GU: Neg CVAT.  PELVIC EXAM:  Speculum inserted, immediate passage of 225m clot Unable to visualize cervix Digital exam feels like dilation, though [place]owed cervix to be closed  Will have Dr DDamita Dunningsrepeat my exam.  Dilation: 4.5 Effacement (%): 70 Station: -3 Exam by:: MHansel Feinstein CNM.  FHT:  + x 2 per [place] Dr DDamita Dunningsdid an exam and felt external os of cervix to be 4cm, cervix feels long Has lost over a liter of blood so far.   Labs: Pending --/--/A POS (02/20 [id number]  Imaging:  Bedside [place].  Report to follow  MAU Course/MDM: I have reviewed the triage vital signs and the nursing notes.   Pertinent labs & imaging results that were available during my care of the patient were reviewed by me and considered in my medical decision making (see chart for details).      I have reviewed her medical records including past results, notes and treatments.   I have ordered labs and IV access per IV Team.  22G inserted, NS started Dilaudid given IM for pain with only mild relief.  Toradol and Fentanyl added.    Consult Dr DDamita Dunningswith presentation, exam findings and test results.    Assessment: Twin  pregnancy at 158w6deavy vaginal bleeding Uterine contractions S/P selective reduction of triplet B  Plan: Admit to Labor and Delivery Orders per Dr DuDamita DunningsD and Labor team to follow  MaHansel FeinsteinNM, MSN Certified Nurse-Midwife 08/30/2022 5:51 AM

## 2022-08-30 NOTE — MAU Note (Signed)
Pt came in actively bleeding through pad and clothes taken directly to room 122 at 0535. Pt states the bleeding started at 0430 this morning. Pt states she is having lower abdominal and back pain that she rates 9/10. 3 RN's at bedside along with Hansel Feinstein, CNM. Pain medication ordered and given per provider orders. [place] at bedside. Dr. Damita Dunnings, MD at bedside to watch [place] and recheck pt cervix. Pt to be transferred upstairs to L&D.

## 2022-08-30 NOTE — Anesthesia Procedure Notes (Signed)
Epidural Patient location during procedure: OB Start time: 08/30/2022 7:43 AM End time: 08/30/2022 7:55 AM  Staffing Anesthesiologist: Barnet Glasgow, MD Performed: anesthesiologist   Preanesthetic Checklist Completed: patient identified, IV checked, site marked, risks and benefits discussed, surgical consent, monitors and equipment checked, pre-op evaluation and timeout performed  Epidural Patient position: sitting Prep: DuraPrep and site prepped and draped Patient monitoring: continuous pulse ox and blood pressure Approach: midline Location: L3-L4 Injection technique: LOR air  Needle:  Needle type: Tuohy  Needle gauge: 17 G Needle length: 9 cm and 9 Needle insertion depth: 7 cm Catheter type: closed end flexible Catheter size: 19 Gauge Catheter at skin depth: 13 cm Test dose: negative  Assessment Events: blood not aspirated, no cerebrospinal fluid, injection not painful, no injection resistance, no paresthesia and negative IV test  Additional Notes Patient identified. Risks/Benefits/Options discussed with patient including but not limited to bleeding, infection, nerve damage, paralysis, failed block, incomplete pain control, headache, blood pressure changes, nausea, vomiting, reactions to medication both or allergic, itching and postpartum back pain. Confirmed with bedside nurse the patient's most recent platelet count. Confirmed with patient that they are not currently taking any anticoagulation, have any bleeding history or any family history of bleeding disorders. Patient expressed understanding and wished to proceed. All questions were answered. Sterile technique was used throughout the entire procedure. Please see nursing notes for vital signs. Test dose was given through epidural needle and negative prior to continuing to dose epidural or start infusion. Warning signs of high block given to the patient including shortness of breath, tingling/numbness in hands, complete  motor block, or any concerning symptoms with instructions to call for help. Patient was given instructions on fall risk and not to get out of bed. All questions and concerns addressed with instructions to call with any issues.  1 Attempt (S) . Patient tolerated procedure well.

## 2022-08-30 NOTE — Progress Notes (Signed)
Pt reassessed due to passage of large clots - QBL of those clots was 375. She was in pain again and pain medications have worn off.   BP is normal, HR 112 (baseline during her prior admission).  Perineum inspected, not actively bleeding from her vagina (but comes in gushes with clots)  CBC    Component Value Date/Time   WBC 19.2 (H) 08/30/2022 0621   RBC 3.17 (L) 08/30/2022 0621   HGB 9.9 (L) 08/30/2022 0621   HCT 29.9 (L) 08/30/2022 0621   PLT 325 08/30/2022 0621   PLT 331 08/30/2022 0621   MCV 94.3 08/30/2022 0621   MCH 31.2 08/30/2022 0621   MCHC 33.1 08/30/2022 0621   RDW 13.5 08/30/2022 0621   LYMPHSABS 1.1 08/26/2022 0519   MONOABS 0.3 08/26/2022 0519   EOSABS 0.1 08/26/2022 0519   BASOSABS 0.1 08/26/2022 0519   DIC panel normal at this time, fibrinogen >600, INR 1.0.   Plan - Obtain second IV (18 gauge now placed at the time of this note) - Transfuse 2 units once available - Epidural being placed now - Reevaluate in 4-6 hours from initial check.   Radene Gunning, MD Attending [place], [facility] for [facility], [place]

## 2022-08-30 NOTE — Progress Notes (Signed)
Patient ID: Carol Paul, female   DOB: 04-05-1996, 27 y.o.   MRN: [id number] Called to see patient to answer questions. Discussed likely causes. She recently passed a clot with probable attached membranes.  Discussed genetic testing, which she declined. Discussed usual bleeding, and recovery as well as when she might attempt pregnancy again.

## 2022-08-30 NOTE — Progress Notes (Signed)
RN called to say that patient wants to go home.  She was told it was recommended that she stay for observation overnight given her infection (Tmax 102.7 around 1345) for which she is receiving Zosyn, and her EBL of 2400 ml for which she received 4 total pRBCs.  Her bleeding is minimal now, but she has remained mildly tachycardic. She is adamant that she wants to go home.  Wants the second dose of her Zosyn to be administered earlier, we will call Pharmacy and see if this can be done.  We will check labs too to see what her current hemoglobin is.  Continue to monitor closely and re-discuss discharge if she desires after her Zosyn infusion.  Dr. Kennon Rounds was informed about this situation.   Verita Schneiders, MD, [place] for [employer], [place]

## 2022-08-30 NOTE — Progress Notes (Signed)
Patient is a 27 yo G1 at 17+6, di-di twins s/p reduction of third gestation, who has been abrupting for approximately 2 weeks. She returned to the hospital this morning with heavy blood loss (EBL thus far 1522 ml) and in labor. Cervix was 1 cm recently; 4 on admission and now 5 cm. She appears to be in labor and she is actively abrupting requiring blood transfusion. DIC panel fortunately negative. 2 units blood have been ordered and are infusing. I explained to the patient that the current clinical situation poses imminent risk to her health and life and that any additional delay will increase her risk of morbidity and mortality. We discussed medical and surgical termination risks/benefits and she elects to proceed with medical termination. Written informed consent obtained and [facility] medical abortion consent form and other necessary paperwork completed.

## 2022-08-30 NOTE — H&P (Signed)
Chief Complaint:  Abdominal Pain and Vaginal Bleeding   Event Date/Time   First Provider Initiated Contact with Patient 08/30/22 0540   HPI: Carol Paul is a 27 y.o. G1P0000 at 42w6dho presents to maternity admissions reporting onset of severe cramping and heavy bleeding at about 5am.  States had a little spotting at 4 but soaked pads an hour later with increase in cramping.  Patient is s/p a selective reduction of triplet pregnancy (triplet B) to twins.  Was just discharged yesterday after having heavy bleeding over the past week. She required a transfusion of 3 units PRBC while here.. She reports good fetal movement, denies LOF, vaginal bleeding, vaginal itching/burning, urinary symptoms, h/a, dizziness, n/v, diarrhea, constipation or fever/chills.  She denies headache, visual changes or RUQ abdominal pain.  Abdominal Pain This is a recurrent problem. The current episode started today. The problem occurs constantly. The pain is located in the LLQ, RLQ and suprapubic region. The pain is severe. The quality of the pain is cramping. Associated symptoms include nausea. Pertinent negatives include no fever. Nothing aggravates the pain. The pain is relieved by Nothing. She has tried nothing for the symptoms.  Vaginal Bleeding The patient's primary symptoms include pelvic pain and vaginal bleeding. The patient's pertinent negatives include no genital odor. This is a recurrent problem. The current episode started today. The problem occurs constantly. The problem has been gradually worsening. The pain is severe. She is pregnant. Associated symptoms include abdominal pain and nausea. Pertinent negatives include no fever. The vaginal discharge was bloody. The vaginal bleeding is heavier than menses. She has been passing clots. Nothing aggravates the symptoms. She has tried nothing for the symptoms.    Past Medical History: Past Medical History:  Diagnosis Date   Asthma     Past obstetric  history: OB History  Gravida Para Term Preterm AB Living  1 0 0 0 0 0  SAB IAB Ectopic Multiple Live Births  0 0 0 0 0    # Outcome Date GA Lbr Len/2nd Weight Sex Delivery Anes PTL Lv  1 Current             Past Surgical History: Past Surgical History:  Procedure Laterality Date   TONSILLECTOMY      Family History: Family History  Problem Relation Age of Onset   CAD Other    Diabetes Mother     Social History: Social History   Tobacco Use   Smoking status: Former    Passive exposure: Current   Smokeless tobacco: Never  Vaping Use   Vaping Use: Every day   Substances: Nicotine, THC, Flavoring  Substance Use Topics   Alcohol use: Not Currently    Comment: social   Drug use: Not Currently    Types: Marijuana    Allergies:  Allergies  Allergen Reactions   Other Other (See Comments)    GI discomfort to wheat, soy, eggs, milk and sesame    Meds:  Medications Prior to Admission  Medication Sig Dispense Refill Last Dose   albuterol (VENTOLIN HFA) 108 (90 Base) MCG/ACT inhaler Inhale 1-2 puffs into the lungs every 6 (six) hours as needed for wheezing or shortness of breath. 8 g 0    docusate sodium (COLACE) 100 MG capsule Take 1 capsule (100 mg total) by mouth daily. 10 capsule 0    ferrous sulfate 325 (65 FE) MG tablet Take 1 tablet (325 mg total) by mouth every other day. 30 tablet 3    hydrOXYzine (ATARAX)  25 MG tablet Take 1 tablet (25 mg total) by mouth 3 (three) times daily as needed for anxiety. 60 tablet 2    prochlorperazine (COMPAZINE) 10 MG tablet Take 1 tablet (10 mg total) by mouth every 6 (six) hours as needed for nausea or vomiting. 30 tablet 0    progesterone (PROMETRIUM) 200 MG capsule Place 1 capsule (200 mg total) vaginally at bedtime. 30 capsule 3    promethazine (PHENERGAN) 25 MG suppository Place 1 suppository (25 mg total) rectally every 8 (eight) hours as needed for up to 12 doses for nausea or vomiting. 12 each 0     I have reviewed  patient's Past Medical Hx, Surgical Hx, Family Hx, Social Hx, medications and allergies.   ROS:  Review of Systems  Constitutional:  Negative for fever.  Gastrointestinal:  Positive for abdominal pain and nausea.  Genitourinary:  Positive for pelvic pain and vaginal bleeding.   Other systems negative  Physical Exam  Patient Vitals for the past 24 hrs:  BP Temp Temp src Pulse Resp SpO2  08/30/22 0540 127/80 98.4 F (36.9 C) Oral (!) 113 20 95 %   Constitutional: Well-developed, well-nourished female in significant pain, writhing and moaning  Cardiovascular: normal rate Respiratory: normal effort GI: Abd soft, non-tender, gravid appropriate for gestational age.   No rebound or guarding. MS: Extremities nontender, no edema, normal ROM Neurologic: Alert and oriented x 4.  GU: Neg CVAT.  PELVIC EXAM:  Speculum inserted, immediate passage of 28m clot Unable to visualize cervix Digital exam feels like dilation, though [place]owed cervix to be closed  Will have Dr DDamita Dunningsrepeat my exam.  Dilation: 4.5 Effacement (%): 70 Station: -3 Exam by:: MHansel Feinstein CNM.  FHT:  + x 2 per [place] Dr DDamita Dunningsdid an exam and felt external os of cervix to be 4cm, cervix feels long Has lost over a liter of blood so far.   Labs: Pending --/--/A POS (02/20 [id number]  Imaging:  Bedside [place]ordered.  Report to follow  MAU Course/MDM: I have reviewed the triage vital signs and the nursing notes.   Pertinent labs & imaging results that were available during my care of the patient were reviewed by me and considered in my medical decision making (see chart for details).      I have reviewed her medical records including past results, notes and treatments.   I have ordered labs and IV access per IV Team.  22G inserted, NS started Dilaudid given IM for pain with only mild relief.  Toradol and Fentanyl added.    Consult Dr DDamita Dunningswith presentation, exam findings and test results.    Assessment: Twin  pregnancy at 195w6deavy vaginal bleeding Uterine contractions S/P selective reduction of triplet B  Plan: Admit to Labor and Delivery Orders per Dr DuDamita DunningsD and Labor team to follow  MaHansel FeinsteinNM, MSN Certified Nurse-Midwife 08/30/2022 5:51 AM

## 2022-08-30 NOTE — Anesthesia Preprocedure Evaluation (Addendum)
Anesthesia Evaluation  Patient identified by MRN, date of birth, ID band Patient awake    Reviewed: Allergy & Precautions, NPO status , Patient's Chart, lab work & pertinent test results  Airway Mallampati: II  TM Distance: >3 FB Neck ROM: Full    Dental no notable dental hx. (+) Teeth Intact, Dental Advisory Given   Pulmonary asthma , Current Smoker, former smoker   Pulmonary exam normal breath sounds clear to auscultation       Cardiovascular Exercise Tolerance: Good Normal cardiovascular exam Rhythm:Regular Rate:Normal     Neuro/Psych  PSYCHIATRIC DISORDERS Anxiety        GI/Hepatic   Endo/Other  negative endocrine ROS    Renal/GU      Musculoskeletal   Abdominal  (+) + obese (BmI 36.6)  Peds  Hematology  (+) Blood dyscrasia, anemia Lab Results      Component                Value               Date                      WBC                      19.2 (H)            08/30/2022                HGB                      9.9 (L)             08/30/2022                HCT                      29.9 (L)            08/30/2022                MCV                      94.3                08/30/2022                PLT                      325                 08/30/2022                PLT                      331                 08/30/2022              Anesthesia Other Findings   Reproductive/Obstetrics (+) Pregnancy                             Anesthesia Physical Anesthesia Plan  ASA: 3  Anesthesia Plan: Epidural   Post-op Pain Management:    Induction:   PONV Risk Score and Plan:   Airway Management Planned:   Additional Equipment:   Intra-op Plan:   Post-operative Plan:  Informed Consent: I have reviewed the patients History and Physical, chart, labs and discussed the procedure including the risks, benefits and alternatives for the proposed anesthesia with the patient or authorized  representative who has indicated his/her understanding and acceptance.       Plan Discussed with:   Anesthesia Plan Comments: (17.6 wk Primagravida w Twins for Hemoorrage)       Anesthesia Quick Evaluation

## 2022-08-30 NOTE — Discharge Summary (Signed)
Postpartum Discharge Summary  Date of Service updated***     Patient Name: Carol Paul DOB: 17-Sep-1995 MRN: [id number]  Date of admission: 08/30/2022 Delivery date:This patient has no babies on file. Delivering provider: This patient has no babies on file. Date of discharge: 08/30/2022  Admitting diagnosis: Vaginal bleeding during pregnancy [O46.90] Intrauterine pregnancy: [redacted]w[redacted]d    Secondary diagnosis:  Active Problems:   Vaginal bleeding during pregnancy  Additional problems: ***    Discharge diagnosis: Preterm Pregnancy Delivered and Anemia                                              Post partum procedures:blood transfusion Augmentation: Cytotec Complications: Placental Abruption, Intrauterine Inflammation or infection (Chorioamniotis), [facility]>1007m [facility] Onset of Labor With Vaginal Delivery      [age].o. yo G1P0000 at 1742w6ds admitted in Active Labor on 08/30/2022. Labor course was complicated by hemorrhage and intrauterine infection  Membrane Rupture Time/Date: This patient has no babies on file.,This patient has no babies on file.  Delivery Method:This patient has no babies on file. Episiotomy: This patient has no babies on file. Lacerations:  This patient has no babies on file. Patient had a postpartum course complicated by ***.  She is ambulating, tolerating a regular diet, passing flatus, and urinating well. Patient is discharged home in stable condition on 08/30/22.  Newborn Data: Birth date:This patient has no babies on file. Birth time:This patient has no babies on file. Gender:This patient has no babies on file. Living status:This patient has no babies on file. Apgars:This patient has no babies on file.,This patient has no babies on file. Weight:This patient has no babies on file.  Magnesium Sulfate received: {Mag received:30440022} BMZ received: {BMZ received:30440023} Rhophylac:{Rhophylac  received:30440032} MMRIY:[id number]:{Tdap:23962} Flu: {FlWG:1132360ansfusion:{Transfusion received:30440034}  Physical exam  Vitals:   08/30/22 1116 08/30/22 1316 08/30/22 1331 08/30/22 1345  BP: (!) 91/59 (!) 79/51 (!) 102/28 (!) 97/55  Pulse: (!) 120 (!) 120 (!) 114 (!) 111  Resp: 16   17  Temp:   (!) 102.7 F (39.3 C) (!) 102.7 F (39.3 C)  TempSrc:   Oral Oral  SpO2:    99%   General: {Exam; general:21111117} Lochia: {Desc; appropriate/inappropriate:30686::"appropriate"} Uterine Fundus: {Desc; firm/soft:30687} Incision: {Exam; incision:21111123} DVT Evaluation: {Exam; dvtYS:4447741bs: Lab Results  Component Value Date   WBC 19.2 (H) 08/30/2022   HGB 9.9 (L) 08/30/2022   HCT 29.9 (L) 08/30/2022   MCV 94.3 08/30/2022   PLT 325 08/30/2022   PLT 331 08/30/2022      Latest Ref Rng & Units 08/25/2022    3:05 PM  CMP  Glucose 70 - 99 mg/dL 111   BUN 6 - 20 mg/dL <5   Creatinine 0.44 - 1.00 mg/dL 0.52   Sodium 135 - 145 mmol/L 133   Potassium 3.5 - 5.1 mmol/L 3.7   Chloride 98 - 111 mmol/L 103   CO2 22 - 32 mmol/L 19   Calcium 8.9 - 10.3 mg/dL 8.5   Total Protein 6.5 - 8.1 g/dL 5.9   Total Bilirubin 0.3 - 1.2 mg/dL 0.2   Alkaline Phos 38 - 126 U/L 74   AST 15 - 41 U/L 22   ALT 0 - 44 U/L 13    Edinburgh Score:     No data to display  After visit meds:  Allergies as of 08/30/2022       Reactions   Other Other (See Comments)   GI discomfort to wheat, soy, eggs, milk and sesame     Med Rec must be completed prior to using this [facility]***        Discharge instruction: per After Visit Summary and Postpartum booklet. Activity: Advance as tolerated. Pelvic rest for 6 weeks.  Diet: routine diet Future Appointments: Future Appointments  Date Time Provider [place]  09/09/2022  9:55 AM Griffin Basil, MD [facility]  09/10/2022  1:15 PM [facility] NURSE [facility]  09/10/2022  1:30 PM [facility] US2 [facility] [place]   Follow up  Visit:   Please schedule this patient for a In person postpartum visit in 4 weeks with the following provider: Any provider. Additional Postpartum F/U:Postpartum Depression checkup  High risk pregnancy complicated by:  stillbirth at 17 weeks  Delivery mode:  This patient has no babies on file. Anticipated Birth Control:  Unsure   08/30/2022 Wende Mott, CNM

## 2022-08-31 ENCOUNTER — Inpatient Hospital Stay (HOSPITAL_COMMUNITY): Payer: Medicaid Other

## 2022-08-31 LAB — TYPE AND SCREEN
ABO/RH(D): A POS
Antibody Screen: NEGATIVE
Unit division: 0
Unit division: 0
Unit division: 0
Unit division: 0
Unit division: 0
Unit division: 0
Unit division: 0
Unit division: 0

## 2022-08-31 LAB — BPAM RBC
Blood Product Expiration Date: 202402272359
Blood Product Expiration Date: 202403082359
Blood Product Expiration Date: 202403132359
Blood Product Expiration Date: 202403132359
Blood Product Expiration Date: 202403152359
Blood Product Expiration Date: 202403152359
Blood Product Expiration Date: 202403172359
Blood Product Expiration Date: 202403172359
ISSUE DATE / TIME: 202402230756
ISSUE DATE / TIME: 202402230756
ISSUE DATE / TIME: 202402231328
ISSUE DATE / TIME: 202402231328
ISSUE DATE / TIME: 202402240455
Unit Type and Rh: 6200
Unit Type and Rh: 6200
Unit Type and Rh: 6200
Unit Type and Rh: 6200
Unit Type and Rh: 6200
Unit Type and Rh: 6200
Unit Type and Rh: 9500
Unit Type and Rh: 9500

## 2022-08-31 LAB — CBC
HCT: 26.4 % — ABNORMAL LOW (ref 36.0–46.0)
Hemoglobin: 9.3 g/dL — ABNORMAL LOW (ref 12.0–15.0)
MCH: 29.8 pg (ref 26.0–34.0)
MCHC: 35.2 g/dL (ref 30.0–36.0)
MCV: 84.6 fL (ref 80.0–100.0)
Platelets: 194 10*3/uL (ref 150–400)
RBC: 3.12 MIL/uL — ABNORMAL LOW (ref 3.87–5.11)
RDW: 17.2 % — ABNORMAL HIGH (ref 11.5–15.5)
WBC: 14.8 10*3/uL — ABNORMAL HIGH (ref 4.0–10.5)
nRBC: 0 % (ref 0.0–0.2)

## 2022-08-31 MED ORDER — OXYCODONE HCL 5 MG PO TABS
5.0000 mg | ORAL_TABLET | Freq: Four times a day (QID) | ORAL | Status: DC | PRN
  Administered 2022-08-31: 5 mg via ORAL
  Filled 2022-08-31: qty 1

## 2022-08-31 MED ORDER — IBUPROFEN 600 MG PO TABS: 600.0000 mg | ORAL_TABLET | Freq: Four times a day (QID) | ORAL | 0 refills | Status: DC

## 2022-08-31 MED ORDER — FERROUS SULFATE 325 (65 FE) MG PO TBEC: 325.0000 mg | DELAYED_RELEASE_TABLET | ORAL | 3 refills | Status: DC

## 2022-08-31 MED ORDER — FERROUS SULFATE 325 (65 FE) MG PO TABS
325.0000 mg | ORAL_TABLET | ORAL | Status: DC
  Filled 2022-08-31: qty 1

## 2022-08-31 NOTE — Anesthesia Postprocedure Evaluation (Signed)
Anesthesia Post Note  Patient: Amiel J Dopson  Procedure(s) Performed: AN AD HOC LABOR EPIDURAL     Patient location during evaluation: Mother Baby Anesthesia Type: Epidural Level of consciousness: awake and alert Pain management: pain level controlled Vital Signs Assessment: post-procedure vital signs reviewed and stable Respiratory status: spontaneous breathing, nonlabored ventilation and respiratory function stable Cardiovascular status: stable Postop Assessment: no headache, no backache, epidural receding and able to ambulate Anesthetic complications: no   No notable events documented.  Last Vitals:  Vitals:   08/31/22 0014 08/31/22 0755  BP: 99/68 (!) 96/56  Pulse: (!) 105 (!) 102  Resp: 16 19  Temp: 36.7 C 36.8 C  SpO2: 96% 100%    Last Pain:  Vitals:   08/31/22 0755  TempSrc: Oral  PainSc:    Pain Goal:                   Harpreet Pompey

## 2022-08-31 NOTE — Progress Notes (Addendum)
CSW acknowledges social work consult placed due to triplet fetal demises. Per RN, pt desired discharge and declined social work consult.   Signed,  Berniece Salines, MSW, LCSWA, LCASA 08/31/2022 11:10 AM

## 2022-09-02 LAB — SURGICAL PATHOLOGY

## 2022-09-07 ENCOUNTER — Telehealth (HOSPITAL_COMMUNITY): Payer: Self-pay

## 2022-09-07 NOTE — Telephone Encounter (Signed)
Patient reports that she is not well. She states that she is seeing a therapist. Patient declines questions/concerns about her health and healing. RN reviewed bereavement support group information and will e-mail this to patient.  EPDS- Patient declines wanting to do EPDS.   [facility] [place]   09/07/22,1335

## 2022-09-09 ENCOUNTER — Encounter: Payer: Medicaid Other | Admitting: Obstetrics and Gynecology

## 2022-09-10 ENCOUNTER — Ambulatory Visit: Payer: Medicaid Other

## 2022-09-12 DIAGNOSIS — Z8759 Personal history of other complications of pregnancy, childbirth and the puerperium: Secondary | ICD-10-CM | POA: Insufficient documentation

## 2022-10-02 ENCOUNTER — Ambulatory Visit: Payer: Medicaid Other | Admitting: Obstetrics & Gynecology

## 2022-10-03 NOTE — Progress Notes (Deleted)
    Post Partum Visit Note  Carol Paul is a 27 y.o. [phone] female who presents for a postpartum visit. She is {1-10:13787} {time; units:18646} postpartum following a {method of delivery:313099}.  I have fully reviewed the prenatal and intrapartum course. The delivery was at *** gestational weeks.  Anesthesia: {anesthesia types:812}. Postpartum course has been ***. Baby is doing well***. Baby is feeding by {breastmilk/bottle:69}. Bleeding {vag bleed:12292}. Bowel function is {normal:32111}. Bladder function is {normal:32111}. Patient {is/is not:9024} sexually active. Contraception method is {contraceptive method:5051}. Postpartum depression screening: {gen negative/positive:315881}.   The pregnancy intention screening data noted above was reviewed. Potential methods of contraception were discussed. The patient elected to proceed with No data recorded.    Health Maintenance Due  Topic Date Due   COVID-19 Vaccine (1) Never done   HPV VACCINES (1 - 2-dose series) Never done   Hepatitis C Screening  Never done   DTaP/Tdap/Td (1 - Tdap) Never done   PAP-Cervical Cytology Screening  Never done   PAP SMEAR-Modifier  Never done   INFLUENZA VACCINE  02/05/2022   CHLAMYDIA SCREENING  03/01/2022    {Common ambulatory SmartLinks:19316}  Review of Systems {ros; complete:30496}  Objective:  There were no vitals taken for this visit.   General:  {gen appearance:16600}   Breasts:  {desc; normal/abnormal/not indicated:14647}  Lungs: {lung exam:16931}  Heart:  {heart exam:5510}  Abdomen: {abdomen exam:16834}   Wound {Wound assessment:11097}  GU exam:  {desc; normal/abnormal/not indicated:14647}       Assessment:    There are no diagnoses linked to this encounter.  *** postpartum exam.   Plan:   Essential components of care per ACOG recommendations:  1.  Mood and well being: Patient with {gen negative/positive:315881} depression screening today. Reviewed local resources for  support.  - Patient tobacco use? {tobacco use:25506}  - hx of drug use? {yes/no:25505}    2. Infant care and feeding:  -Patient currently breastmilk feeding? {yes/no:25502}  -Social determinants of health (SDOH) reviewed in EPIC. No concerns***The following needs were identified***  3. Sexuality, contraception and birth spacing - Patient {DOES_DOES JZ:4998275 want a pregnancy in the next year.  Desired family size is {NUMBER 1-10:22536} children.  - Reviewed reproductive life planning. Reviewed contraceptive methods based on pt preferences and effectiveness.  Patient desired {Upstream End Methods:24109} today.   - Discussed birth spacing of 18 months  4. Sleep and fatigue -Encouraged family/partner/community support of 4 hrs of uninterrupted sleep to help with mood and fatigue  5. Physical Recovery  - Discussed patients delivery and complications. She describes her labor as {description:25511} - Patient had a {CHL AMB DELIVERY:[phone]}. Patient had a {laceration:25518} laceration. Perineal healing reviewed. Patient expressed understanding - Patient has urinary incontinence? {yes/no:25515} - Patient {ACTION; IS/IS GI:087931 safe to resume physical and sexual activity  6.  Health Maintenance - HM due items addressed {Yes or If no, why not?:20788} - Last pap smear No results found for: "DIAGPAP" Pap smear {done:10129} at today's visit.  -Breast Cancer screening indicated? {indicated:25516}  7. Chronic Disease/Pregnancy Condition follow up: {Follow up:25499}  - PCP follow up  [facility] [place]

## 2022-10-07 ENCOUNTER — Ambulatory Visit: Payer: Medicaid Other | Admitting: Family Medicine

## 2022-11-11 ENCOUNTER — Encounter (HOSPITAL_COMMUNITY): Payer: Self-pay

## 2022-11-11 ENCOUNTER — Emergency Department (HOSPITAL_COMMUNITY)
Admission: EM | Admit: 2022-11-11 | Discharge: 2022-11-11 | Discharge status: Against Medical Advice | Payer: Medicaid Other | Attending: Emergency Medicine | Admitting: Emergency Medicine

## 2022-11-11 ENCOUNTER — Other Ambulatory Visit: Payer: Self-pay

## 2022-11-11 ENCOUNTER — Emergency Department (HOSPITAL_COMMUNITY): Payer: Medicaid Other

## 2022-11-11 DIAGNOSIS — R197 Diarrhea, unspecified: Secondary | ICD-10-CM | POA: Insufficient documentation

## 2022-11-11 DIAGNOSIS — J45909 Unspecified asthma, uncomplicated: Secondary | ICD-10-CM | POA: Diagnosis not present

## 2022-11-11 DIAGNOSIS — R112 Nausea with vomiting, unspecified: Secondary | ICD-10-CM | POA: Insufficient documentation

## 2022-11-11 DIAGNOSIS — R1031 Right lower quadrant pain: Secondary | ICD-10-CM | POA: Diagnosis not present

## 2022-11-11 DIAGNOSIS — Z5329 Procedure and treatment not carried out because of patient's decision for other reasons: Secondary | ICD-10-CM | POA: Diagnosis not present

## 2022-11-11 LAB — URINALYSIS, ROUTINE W REFLEX MICROSCOPIC
Bilirubin Urine: NEGATIVE
Glucose, UA: NEGATIVE mg/dL
Ketones, ur: NEGATIVE mg/dL
Leukocytes,Ua: NEGATIVE
Nitrite: NEGATIVE
Protein, ur: NEGATIVE mg/dL
Specific Gravity, Urine: 1.02 (ref 1.005–1.030)
pH: 5 (ref 5.0–8.0)

## 2022-11-11 LAB — COMPREHENSIVE METABOLIC PANEL
ALT: 29 U/L (ref 0–44)
AST: 26 U/L (ref 15–41)
Albumin: 3.9 g/dL (ref 3.5–5.0)
Alkaline Phosphatase: 53 U/L (ref 38–126)
Anion gap: 13 (ref 5–15)
BUN: 10 mg/dL (ref 6–20)
CO2: 17 mmol/L — ABNORMAL LOW (ref 22–32)
Calcium: 9.3 mg/dL (ref 8.9–10.3)
Chloride: 107 mmol/L (ref 98–111)
Creatinine, Ser: 0.68 mg/dL (ref 0.44–1.00)
GFR, Estimated: >60 mL/min (ref >60)
Glucose, Bld: 98 mg/dL (ref 70–99)
Potassium: 4.2 mmol/L (ref 3.5–5.1)
Sodium: 137 mmol/L (ref 135–145)
Total Bilirubin: 0.2 mg/dL — ABNORMAL LOW (ref 0.3–1.2)
Total Protein: 7.8 g/dL (ref 6.5–8.1)

## 2022-11-11 LAB — CBC WITH DIFFERENTIAL/PLATELET
Abs Immature Granulocytes: 0.08 10*3/uL — ABNORMAL HIGH (ref 0.00–0.07)
Basophils Absolute: 0.1 10*3/uL (ref 0.0–0.1)
Basophils Relative: 1 %
Eosinophils Absolute: 0.3 10*3/uL (ref 0.0–0.5)
Eosinophils Relative: 3 %
HCT: 43.3 % (ref 36.0–46.0)
Hemoglobin: 14.1 g/dL (ref 12.0–15.0)
Immature Granulocytes: 1 %
Lymphocytes Relative: 26 %
Lymphs Abs: 2.8 10*3/uL (ref 0.7–4.0)
MCH: 30.1 pg (ref 26.0–34.0)
MCHC: 32.6 g/dL (ref 30.0–36.0)
MCV: 92.3 fL (ref 80.0–100.0)
Monocytes Absolute: 0.6 10*3/uL (ref 0.1–1.0)
Monocytes Relative: 5 %
Neutro Abs: 7.2 10*3/uL (ref 1.7–7.7)
Neutrophils Relative %: 64 %
Platelets: 325 10*3/uL (ref 150–400)
RBC: 4.69 MIL/uL (ref 3.87–5.11)
RDW: 14.4 % (ref 11.5–15.5)
WBC: 11 10*3/uL — ABNORMAL HIGH (ref 4.0–10.5)
nRBC: 0 % (ref 0.0–0.2)

## 2022-11-11 LAB — HCG, QUANTITATIVE, PREGNANCY: hCG, Beta Chain, Quant, S: 1 m[IU]/mL (ref <5)

## 2022-11-11 LAB — LIPASE, BLOOD: Lipase: 36 U/L (ref 11–51)

## 2022-11-11 LAB — POC URINE PREG, ED: Preg Test, Ur: NEGATIVE

## 2022-11-11 MED ORDER — ONDANSETRON HCL 4 MG/2ML IJ SOLN
4.0000 mg | Freq: Once | INTRAMUSCULAR | Status: AC
  Administered 2022-11-11: 4 mg via INTRAVENOUS
  Filled 2022-11-11 (×2): qty 2

## 2022-11-11 MED ORDER — LACTATED RINGERS IV BOLUS
1000.0000 mL | Freq: Once | INTRAVENOUS | Status: AC
  Administered 2022-11-11: 1000 mL via INTRAVENOUS

## 2022-11-11 NOTE — ED Notes (Signed)
Notified EDP of pt behavior and delay in care.

## 2022-11-11 NOTE — ED Provider Triage Note (Cosign Needed)
Emergency Medicine Provider Triage Evaluation Note  Carol Paul , Paul 27 y.o. female  was evaluated in triage.  Pt complains of RLQ pain onset last night.  Patient has associated nausea, vomiting, diarrhea.  Also notes Paul fever of 100.1 last night.  Patient notes that she is actively been trying to conceive.  Notes that she had Paul positive pregnancy test at home.  Does not have an OB/GYN at this time.   Per patient chart review: Patient unfortunately suffered Paul miscarriage in February 2024.  Patient was managed by [facility].  Review of Systems  Positive:  Negative:   Physical Exam  BP (!) 142/102   Pulse (!) 120   Temp 98.2 F (36.8 C) (Oral)   Resp 14   Ht 5\' 2"  (1.575 m)   Wt 90.7 kg   SpO2 97%   BMI 36.57 kg/m  Gen:   Awake, no distress   Resp:  Normal effort MSK:   Moves extremities without difficulty  Other:  TTP noted to RLQ region.   Medical Decision Making  Medically screening exam initiated at 11:50 AM.  Appropriate orders placed.  Carol Paul was informed that the remainder of the evaluation will be completed by another provider, this initial triage assessment does not replace that evaluation, and the importance of remaining in the ED until their evaluation is complete.   11:41 AM - RN noted that patient declined POC urine pregnancy at this time.   11:50 AM - Pt evaluated and agreeable with urine preg at this time.  Patient requesting also i-STAT hCG at this time as she notes that typically her pregnancies do not show up positive for her.  Discussed with patient that we will start with Paul urine pregnancy so that way we do not delay care with the i-STAT hCG if patient needs to be transferred to MAU or ultrasonography to rule out ectopic pregnancy.  [place] ordered at this time.   Workup initiated   Carol Morton A, PA-C 11/11/22 1426

## 2022-11-11 NOTE — ED Notes (Signed)
Pt. Snapped on me when I asked her to get dressed in a gown. Pt. Stated that she was not wearing the gown because she had to wait 4 hours in the lobby.

## 2022-11-11 NOTE — ED Notes (Addendum)
Went into pt room and pt verbally aggressive w/ this Charity fundraiser. This RN introduced herself to pt and said that we would need to her to change into a gown. Pt then said "Why" with an attitude. This RN was trying to explain that she can't be fully assessed unless she is in a gown. Pt then became verbally aggressive w/ this RN. I explained to the pt that I would not tolerate verbal aggression and find her another nurse. This RN then walked out of the room and notified CN. Pt called this RN a "bitch" as I walked out of the room to prevent further escalation. Pt then unhooked herself and came out to the nurse's station and was verbally aggressive w/ this RN, IT trainer. Pt's visitor verbally aggressive as well. Security called to bedside. Pt hysterical and verbally aggressive w/ staff.
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
## 2022-11-11 NOTE — ED Provider Notes (Signed)
[place] EMERGENCY DEPARTMENT AT [facility] Provider Note   CSN: [id number] Arrival date & time: 11/11/22  1118     History {Add pertinent medical, surgical, social history, OB history to HPI:1} Chief Complaint  Patient presents with   Abdominal Pain    Carol Paul is a 27 y.o. female.  HPI     27 year old female with history of asthma, history of placental abruption with preterm labor at 40 weeks with a Mercie Eon twin pregnancy and postpartum hemorrhage 08/2022, who presents with concern for RLQ abdominal pain, nausea, vomiting, diarrhea.  Had temperature 100.1  Past Medical History:  Diagnosis Date   Asthma     Home Medications Prior to Admission medications   Medication Sig Start Date End Date Taking? Authorizing Provider  albuterol (VENTOLIN HFA) 108 (90 Base) MCG/ACT inhaler Inhale 1-2 puffs into the lungs every 6 (six) hours as needed for wheezing or shortness of breath. 04/07/20   Henderly, Britni A, PA-C  docusate sodium (COLACE) 100 MG capsule Take 1 capsule (100 mg total) by mouth daily. 08/30/22   Anyanwu, Jethro Bastos, MD  ferrous sulfate 325 (65 FE) MG EC tablet Take 1 tablet (325 mg total) by mouth every other day. 08/31/22   Reva Bores, MD  ferrous sulfate 325 (65 FE) MG tablet Take 1 tablet (325 mg total) by mouth every other day. 08/30/22   Anyanwu, Jethro Bastos, MD  hydrOXYzine (ATARAX) 25 MG tablet Take 1 tablet (25 mg total) by mouth 3 (three) times daily as needed for anxiety. 08/29/22   Anyanwu, Jethro Bastos, MD  ibuprofen (ADVIL) 600 MG tablet Take 1 tablet (600 mg total) by mouth every 6 (six) hours. 08/31/22   Reva Bores, MD  prochlorperazine (COMPAZINE) 10 MG tablet Take 1 tablet (10 mg total) by mouth every 6 (six) hours as needed for nausea or vomiting. 08/29/22   Anyanwu, Jethro Bastos, MD  progesterone (PROMETRIUM) 200 MG capsule Place 1 capsule (200 mg total) vaginally at bedtime. 08/29/22   Anyanwu, Jethro Bastos, MD  promethazine (PHENERGAN) 25 MG  suppository Place 1 suppository (25 mg total) rectally every 8 (eight) hours as needed for up to 12 doses for nausea or vomiting. 01/10/21   Terald Sleeper, MD      Allergies    Other    Review of Systems   Review of Systems  Physical Exam Updated Vital Signs BP (!) 142/102   Pulse (!) 120   Temp 98.2 F (36.8 C) (Oral)   Resp 14   Ht 5\' 2"  (1.575 m)   Wt 90.7 kg   SpO2 97%   BMI 36.57 kg/m  Physical Exam  ED Results / Procedures / Treatments   Labs (all labs ordered are listed, but only abnormal results are displayed) Labs Reviewed  CBC WITH DIFFERENTIAL/PLATELET - Abnormal; Notable for the following components:      Result Value   WBC 11.0 (*)    Abs Immature Granulocytes 0.08 (*)    All other components within normal limits  URINALYSIS, ROUTINE W REFLEX MICROSCOPIC - Abnormal; Notable for the following components:   APPearance HAZY (*)    Hgb urine dipstick SMALL (*)    Bacteria, UA RARE (*)    All other components within normal limits  COMPREHENSIVE METABOLIC PANEL - Abnormal; Notable for the following components:   CO2 17 (*)    Total Bilirubin 0.2 (*)    All other components within normal limits  LIPASE, BLOOD  POC URINE PREG, ED  I-STAT BETA HCG BLOOD, ED ([facility], [facility], [facility] ONLY)    EKG None  Radiology US Pelvis Complete  Result Date: 11/11/2022 CLINICAL DATA:  Acute right lower quadrant abdominal pain. EXAM: TRANSABDOMINAL AND TRANSVAGINAL ULTRASOUND OF PELVIS DOPPLER ULTRASOUND OF OVARIES TECHNIQUE: Both transabdominal and transvaginal ultrasound examinations of the pelvis were performed. Transabdominal technique was performed for global imaging of the pelvis including uterus, ovaries, adnexal regions, and pelvic cul-de-sac. It was necessary to proceed with endovaginal exam following the transabdominal exam to visualize the endometrium. Color and duplex Doppler ultrasound was utilized to evaluate blood flow to the ovaries. COMPARISON:  Ultrasound of August 31, 2022. FINDINGS: Uterus Measurements: 7.8 x 5.0 x 3.2 cm = volume: 66 mL. No fibroids or other mass visualized. Endometrium Thickness: 5 mm which is within normal limits. No focal abnormality visualized. Right ovary Measurements: 2.2 x 1.9 x 1.3 cm = volume: 3 mL. Normal appearance/no adnexal mass. Left ovary Measurements: 3.2 x 2.8 x 1.3 cm = volume: 6 mL. Normal appearance/no adnexal mass. Pulsed Doppler evaluation of both ovaries demonstrates normal low-resistance arterial and venous waveforms. Other findings No abnormal free fluid. IMPRESSION: No evidence of ovarian mass or torsion. No definite abnormality seen in the pelvis. Electronically Signed   By: Lupita Raider M.D.   On: 11/11/2022 16:06   US Transvaginal Non-OB  Result Date: 11/11/2022 CLINICAL DATA:  Acute right lower quadrant abdominal pain. EXAM: TRANSABDOMINAL AND TRANSVAGINAL ULTRASOUND OF PELVIS DOPPLER ULTRASOUND OF OVARIES TECHNIQUE: Both transabdominal and transvaginal ultrasound examinations of the pelvis were performed. Transabdominal technique was performed for global imaging of the pelvis including uterus, ovaries, adnexal regions, and pelvic cul-de-sac. It was necessary to proceed with endovaginal exam following the transabdominal exam to visualize the endometrium. Color and duplex Doppler ultrasound was utilized to evaluate blood flow to the ovaries. COMPARISON:  Ultrasound of August 31, 2022. FINDINGS: Uterus Measurements: 7.8 x 5.0 x 3.2 cm = volume: 66 mL. No fibroids or other mass visualized. Endometrium Thickness: 5 mm which is within normal limits. No focal abnormality visualized. Right ovary Measurements: 2.2 x 1.9 x 1.3 cm = volume: 3 mL. Normal appearance/no adnexal mass. Left ovary Measurements: 3.2 x 2.8 x 1.3 cm = volume: 6 mL. Normal appearance/no adnexal mass. Pulsed Doppler evaluation of both ovaries demonstrates normal low-resistance arterial and venous waveforms. Other findings No abnormal free fluid. IMPRESSION: No  evidence of ovarian mass or torsion. No definite abnormality seen in the pelvis. Electronically Signed   By: Lupita Raider M.D.   On: 11/11/2022 16:06   [place] Art/Ven Flow Abd Pelv Doppler  Result Date: 11/11/2022 CLINICAL DATA:  Acute right lower quadrant abdominal pain. EXAM: TRANSABDOMINAL AND TRANSVAGINAL ULTRASOUND OF PELVIS DOPPLER ULTRASOUND OF OVARIES TECHNIQUE: Both transabdominal and transvaginal ultrasound examinations of the pelvis were performed. Transabdominal technique was performed for global imaging of the pelvis including uterus, ovaries, adnexal regions, and pelvic cul-de-sac. It was necessary to proceed with endovaginal exam following the transabdominal exam to visualize the endometrium. Color and duplex Doppler ultrasound was utilized to evaluate blood flow to the ovaries. COMPARISON:  Ultrasound of August 31, 2022. FINDINGS: Uterus Measurements: 7.8 x 5.0 x 3.2 cm = volume: 66 mL. No fibroids or other mass visualized. Endometrium Thickness: 5 mm which is within normal limits. No focal abnormality visualized. Right ovary Measurements: 2.2 x 1.9 x 1.3 cm = volume: 3 mL. Normal appearance/no adnexal mass. Left ovary Measurements: 3.2 x 2.8 x 1.3 cm =  volume: 6 mL. Normal appearance/no adnexal mass. Pulsed Doppler evaluation of both ovaries demonstrates normal low-resistance arterial and venous waveforms. Other findings No abnormal free fluid. IMPRESSION: No evidence of ovarian mass or torsion. No definite abnormality seen in the pelvis. Electronically Signed   By: Lupita Raider M.D.   On: 11/11/2022 16:06    Procedures Procedures  {Document cardiac monitor, telemetry assessment procedure when appropriate:1}  Medications Ordered in ED Medications  lactated ringers bolus 1,000 mL (has no administration in time range)    ED Course/ Medical Decision Making/ A&P   {   Click here for ABCD2, HEART and other calculatorsREFRESH Note before signing :1}                            27 year old female with history of asthma, history of placental abruption with preterm labor at 42 weeks with a Mercie Eon twin pregnancy and postpartum hemorrhage 08/2022, who presents with concern for RLQ abdominal pain, nausea, vomiting, diarrhea.    Labs completed and personally about interpreted by me show mild decreased bicarb, no sign of pancreatitis, no hepatitis, no sign of UTI, pregnancy test negative, no anemia.  Pelvic ultrasound was performed which showed no evidence of ovarian mass or torsion.  {Document critical care time when appropriate:1} {Document review of labs and clinical decision tools ie heart score, Chads2Vasc2 etc:1}  {Document your independent review of radiology images, and any outside records:1} {Document your discussion with family members, caretakers, and with consultants:1} {Document social determinants of health affecting pt's care:1} {Document your decision making why or why not admission, treatments were needed:1} Final Clinical Impression(s) / ED Diagnoses Final diagnoses:  None    Rx / DC Orders ED Discharge Orders     None

## 2022-11-11 NOTE — ED Triage Notes (Signed)
Pt c/o RLQ pain, N/V/Dx5d. PT states her fever was 100.1 last night.

## 2022-11-11 NOTE — ED Notes (Signed)
CN at bedside

## 2022-11-11 NOTE — ED Notes (Signed)
Pt provided with warm blanket. 

## 2022-11-27 ENCOUNTER — Other Ambulatory Visit: Payer: Self-pay

## 2022-11-27 ENCOUNTER — Emergency Department (HOSPITAL_BASED_OUTPATIENT_CLINIC_OR_DEPARTMENT_OTHER): Payer: Medicaid Other

## 2022-11-27 ENCOUNTER — Emergency Department (HOSPITAL_BASED_OUTPATIENT_CLINIC_OR_DEPARTMENT_OTHER)
Admission: EM | Admit: 2022-11-27 | Discharge: 2022-11-27 | Disposition: A | Discharge status: Home / Self Care | Payer: Medicaid Other | Attending: Emergency Medicine | Admitting: Emergency Medicine

## 2022-11-27 ENCOUNTER — Encounter (HOSPITAL_BASED_OUTPATIENT_CLINIC_OR_DEPARTMENT_OTHER): Payer: Self-pay | Admitting: Emergency Medicine

## 2022-11-27 DIAGNOSIS — S82872A Displaced pilon fracture of left tibia, initial encounter for closed fracture: Secondary | ICD-10-CM | POA: Diagnosis not present

## 2022-11-27 DIAGNOSIS — J45909 Unspecified asthma, uncomplicated: Secondary | ICD-10-CM | POA: Diagnosis not present

## 2022-11-27 DIAGNOSIS — W010XXA Fall on same level from slipping, tripping and stumbling without subsequent striking against object, initial encounter: Secondary | ICD-10-CM | POA: Insufficient documentation

## 2022-11-27 DIAGNOSIS — R000 Tachycardia, unspecified: Secondary | ICD-10-CM | POA: Insufficient documentation

## 2022-11-27 DIAGNOSIS — S60212A Contusion of left wrist, initial encounter: Secondary | ICD-10-CM

## 2022-11-27 DIAGNOSIS — S82873A Displaced pilon fracture of unspecified tibia, initial encounter for closed fracture: Secondary | ICD-10-CM

## 2022-11-27 DIAGNOSIS — S99912A Unspecified injury of left ankle, initial encounter: Secondary | ICD-10-CM | POA: Diagnosis present

## 2022-11-27 NOTE — ED Notes (Signed)
ED Provider at bedside. 

## 2022-11-27 NOTE — Discharge Instructions (Signed)
Do not put any weight on your foot and wear the splint and use the crutches until you follow-up with orthopedics.  Elevate when you can take Tylenol and ibuprofen as needed for pain.

## 2022-11-27 NOTE — ED Notes (Signed)
RN reviewed discharge instructions with pt. Pt verbalized understanding and had no further questions. VSS upon discharge.  

## 2022-11-27 NOTE — ED Triage Notes (Signed)
Pt via pov from home with left ankle injury after a fall. Pt reports that she slipped and fell and her ankle bent unnaturally. Pt reports that she is unable to feel her toes at this time. Pt alert & oriented, nad noted.

## 2022-11-27 NOTE — ED Notes (Signed)
While walking with crutches, pt reports L wrist pain. Plunkett MD made aware

## 2022-11-27 NOTE — ED Provider Notes (Addendum)
[facility] [facility] AT [facility] Provider Note   CSN: [id number] Arrival date & time: 11/27/22  1702     History  Chief Complaint  Patient presents with   Fall    Carol Paul is a 27 y.o. female.  Patient is a 27 year old female with a history of asthma who is presenting today with complaint of left ankle pain.  She was walking down some brick steps and she tripped and fell.  She landed at the bottom of the steps but her foot was stuck a few steps up.  This occurred about 430 and she has not been able to bear any weight since it happened.  She has been icing and elevating and did take some ibuprofen but is still having pain.  She has no other injury anywhere else except for an abrasion to her left wrist but reports it does not hurt.  She did not hit her head or lose consciousness.  She has had prior surgery on this ankle before to have and" extra bone removed."  The history is provided by the patient.  Fall       Home Medications Prior to Admission medications   Medication Sig Start Date End Date Taking? Authorizing Provider  albuterol (VENTOLIN HFA) 108 (90 Base) MCG/ACT inhaler Inhale 1-2 puffs into the lungs every 6 (six) hours as needed for wheezing or shortness of breath. 04/07/20   Henderly, Britni A, PA-C  docusate sodium (COLACE) 100 MG capsule Take 1 capsule (100 mg total) by mouth daily. 08/30/22   Anyanwu, Jethro Bastos, MD  ferrous sulfate 325 (65 FE) MG EC tablet Take 1 tablet (325 mg total) by mouth every other day. 08/31/22   Reva Bores, MD  ferrous sulfate 325 (65 FE) MG tablet Take 1 tablet (325 mg total) by mouth every other day. 08/30/22   Anyanwu, Jethro Bastos, MD  hydrOXYzine (ATARAX) 25 MG tablet Take 1 tablet (25 mg total) by mouth 3 (three) times daily as needed for anxiety. 08/29/22   Anyanwu, Jethro Bastos, MD  ibuprofen (ADVIL) 600 MG tablet Take 1 tablet (600 mg total) by mouth every 6 (six) hours. 08/31/22   Reva Bores, MD   prochlorperazine (COMPAZINE) 10 MG tablet Take 1 tablet (10 mg total) by mouth every 6 (six) hours as needed for nausea or vomiting. 08/29/22   Anyanwu, Jethro Bastos, MD  progesterone (PROMETRIUM) 200 MG capsule Place 1 capsule (200 mg total) vaginally at bedtime. 08/29/22   Anyanwu, Jethro Bastos, MD  promethazine (PHENERGAN) 25 MG suppository Place 1 suppository (25 mg total) rectally every 8 (eight) hours as needed for up to 12 doses for nausea or vomiting. 01/10/21   Terald Sleeper, MD      Allergies    Other    Review of Systems   Review of Systems  Physical Exam Updated Vital Signs BP 105/79 (BP Location: Right Arm)   Pulse (!) 132   Temp 98.4 F (36.9 C) (Oral)   Resp 18   Ht 5\' 2"  (1.575 m)   Wt 90.7 kg   LMP 11/13/2022 (Exact Date)   SpO2 97%   BMI 36.58 kg/m  Physical Exam Vitals and nursing note reviewed.  Constitutional:      General: She is not in acute distress. HENT:     Head: Normocephalic.  Cardiovascular:     Rate and Rhythm: Tachycardia present.     Pulses: Normal pulses.  Pulmonary:     Effort: Pulmonary effort  is normal.  Musculoskeletal:        General: Tenderness present.       Hands:     Right ankle: Normal.     Left ankle: Tenderness present.     Left Achilles Tendon: Tenderness present.       Legs:     Comments: Patient is unable to dorsiflex the foot and does have pain over palpation of the Achilles  Skin:    General: Skin is warm and dry.     Capillary Refill: Capillary refill takes less than 2 seconds.  Neurological:     Mental Status: She is alert. Mental status is at baseline.  Psychiatric:        Mood and Affect: Mood normal.        Behavior: Behavior normal.     ED Results / Procedures / Treatments   Labs (all labs ordered are listed, but only abnormal results are displayed) Labs Reviewed - No data to display  EKG None  Radiology DG Foot Complete Left  Result Date: 11/27/2022 CLINICAL DATA:  Left ankle pain and swelling after  falling. EXAM: LEFT ANKLE COMPLETE - 3+ VIEW; LEFT FOOT - COMPLETE 3+ VIEW COMPARISON:  Left ankle radiographs 12/20/2015. FINDINGS: Suspected small fracture of the tibial plafond anteriorly, seen on the lateral views of the ankle and foot. No other evidence of acute fracture or dislocation. The joint spaces are preserved. Mild anterolateral soft tissue swelling at the ankle with a possible small joint effusion. No evidence of acute fracture or dislocation in the foot. No evidence of foreign body or soft tissue emphysema. IMPRESSION: 1. Suspected small fracture of the tibial plafond anteriorly. 2. No evidence of acute fracture or dislocation in the foot. Electronically Signed   By: Carey Bullocks M.D.   On: 11/27/2022 18:52   DG Ankle Complete Left  Result Date: 11/27/2022 CLINICAL DATA:  Left ankle pain and swelling after falling. EXAM: LEFT ANKLE COMPLETE - 3+ VIEW; LEFT FOOT - COMPLETE 3+ VIEW COMPARISON:  Left ankle radiographs 12/20/2015. FINDINGS: Suspected small fracture of the tibial plafond anteriorly, seen on the lateral views of the ankle and foot. No other evidence of acute fracture or dislocation. The joint spaces are preserved. Mild anterolateral soft tissue swelling at the ankle with a possible small joint effusion. No evidence of acute fracture or dislocation in the foot. No evidence of foreign body or soft tissue emphysema. IMPRESSION: 1. Suspected small fracture of the tibial plafond anteriorly. 2. No evidence of acute fracture or dislocation in the foot. Electronically Signed   By: Carey Bullocks M.D.   On: 11/27/2022 18:52    Procedures Procedures    Medications Ordered in ED Medications - No data to display  ED Course/ Medical Decision Making/ A&P                             Medical Decision Making Amount and/or Complexity of Data Reviewed Radiology: ordered and independent interpretation performed. Decision-making details documented in ED Course.   Patient presenting  after falling down some brick steps.  She has injury to the left foot.  Significant tenderness over the Achilles tendon and patient will not dorsiflex her foot.  Pulses and capillary refill are intact.  No other significant injury.  Plain images are pending.  7:21 PM I have independently visualized and interpreted pt's images today.  No obvious fractures in the left foot today.  Radiology however is suspected  a small fracture of the tibial plafond and anteriorly.  Given patient still will not dorsiflex the foot will place in a short leg splint and placed on crutches nonweightbearing until she follows up with orthopedics to ensure she does not have Achilles injury.  9:26 PM Patient tried to walk with crutches and was having a lot of pain in her left wrist.  Left wrist was imaged but no acute fracture.  She was placed in a Velcro splint for comfort.  No snuffbox tenderness.  All pain is on the ulnar portion.         Final Clinical Impression(s) / ED Diagnoses Final diagnoses:  Fracture of tibial plafond    Rx / DC Orders ED Discharge Orders     None         Gwyneth Sprout, MD 11/27/22 Filbert Schilder, MD 11/27/22 2126

## 2022-11-29 ENCOUNTER — Ambulatory Visit (INDEPENDENT_AMBULATORY_CARE_PROVIDER_SITE_OTHER): Payer: Medicaid Other | Admitting: Student

## 2022-11-29 DIAGNOSIS — S93492A Sprain of other ligament of left ankle, initial encounter: Secondary | ICD-10-CM | POA: Diagnosis not present

## 2022-11-29 DIAGNOSIS — S82892A Other fracture of left lower leg, initial encounter for closed fracture: Secondary | ICD-10-CM

## 2022-11-29 DIAGNOSIS — M7662 Achilles tendinitis, left leg: Secondary | ICD-10-CM

## 2022-11-29 MED ORDER — MELOXICAM 15 MG PO TABS: 15.0000 mg | ORAL_TABLET | Freq: Every day | ORAL | 0 refills | Status: DC

## 2022-11-29 NOTE — Progress Notes (Signed)
Chief Complaint: Left foot and ankle pain     History of Present Illness:    Carol Paul is a 27 y.o. female presents today for evaluation of pain in left foot and ankle.  She was seen by the emergency room 2 days ago after falling down a few steps at home.  She states that her right foot slid through her sandals while walking downstairs while her left foot was caught a few stairs above.  She was put in a splint in the emergency department.  She has been unable to weight-bear since the injury.  She also likely suffered a wrist sprain so utilizing crutches has been difficult.  States that the pain is moderate to severe and is located over the lateral ankle and Achilles.  Does note some numbness and tingling around the back of the foot and Achilles.  Has been taking Tylenol for pain control.  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   Surgical History:   Left ankle arthroscopy for os trigonum - 2018  PMH/PSH/Family History/Social History/Meds/Allergies:    Past Medical History:  Diagnosis Date   Asthma    Past Surgical History:  Procedure Laterality Date   ANKLE SURGERY Left    TONSILLECTOMY     Social History   Socioeconomic History   Marital status: Married    Spouse name: Lequita Asal   Number of children: Not on file   Years of education: Not on file   Highest education level: Not on file  Occupational History   Not on file  Tobacco Use   Smoking status: Former    Passive exposure: Current   Smokeless tobacco: Never  Vaping Use   Vaping Use: Never used  Substance and Sexual Activity   Alcohol use: Not Currently    Comment: social   Drug use: Not Currently    Types: Marijuana    Comment: daily   Sexual activity: Not Currently    Birth control/protection: None  Other Topics Concern   Not on file  Social History Narrative   Not on file   Social Determinants of Health   Financial Resource  Strain: Not on file  Food Insecurity: No Food Insecurity (08/30/2022)   Hunger Vital Sign    Worried About Running Out of Food in the Last Year: Never true    Ran Out of Food in the Last Year: Never true  Transportation Needs: No Transportation Needs (08/30/2022)   PRAPARE - Transportation    Lack of Transportation (Medical): No    Lack of Transportation (Non-Medical): No  Physical Activity: Not on file  Stress: Not on file  Social Connections: Not on file   Family History  Problem Relation Age of Onset   CAD Other    Diabetes Mother    Allergies  Allergen Reactions   Other Other (See Comments)    GI discomfort to wheat, soy, eggs, milk and sesame   Current Outpatient Medications  Medication Sig Dispense Refill   albuterol (VENTOLIN HFA) 108 (90 Base) MCG/ACT inhaler Inhale 1-2 puffs into the lungs every 6 (six) hours as needed for wheezing or shortness of breath. 8 g 0   docusate sodium (COLACE) 100 MG capsule Take 1 capsule (100 mg total) by mouth daily. 10 capsule 0  ferrous sulfate 325 (65 FE) MG EC tablet Take 1 tablet (325 mg total) by mouth every other day. 60 tablet 3   ferrous sulfate 325 (65 FE) MG tablet Take 1 tablet (325 mg total) by mouth every other day. 30 tablet 3   hydrOXYzine (ATARAX) 25 MG tablet Take 1 tablet (25 mg total) by mouth 3 (three) times daily as needed for anxiety. 60 tablet 2   ibuprofen (ADVIL) 600 MG tablet Take 1 tablet (600 mg total) by mouth every 6 (six) hours. 30 tablet 0   meloxicam (MOBIC) 15 MG tablet Take 1 tablet (15 mg total) by mouth daily for 7 days. 7 tablet 0   prochlorperazine (COMPAZINE) 10 MG tablet Take 1 tablet (10 mg total) by mouth every 6 (six) hours as needed for nausea or vomiting. 30 tablet 0   progesterone (PROMETRIUM) 200 MG capsule Place 1 capsule (200 mg total) vaginally at bedtime. 30 capsule 3   promethazine (PHENERGAN) 25 MG suppository Place 1 suppository (25 mg total) rectally every 8 (eight) hours as needed for up  to 12 doses for nausea or vomiting. 12 each 0   No current facility-administered medications for this visit.   DG Wrist Complete Left  Result Date: 11/27/2022 CLINICAL DATA:  Pain after fall. EXAM: LEFT WRIST - COMPLETE 3+ VIEW COMPARISON:  None Available. FINDINGS: There is no evidence of fracture or dislocation. There is no evidence of arthropathy or other focal bone abnormality. Soft tissues are unremarkable. IMPRESSION: Negative. Electronically Signed   By: Larose Hires D.O.   On: 11/27/2022 21:16   DG Foot Complete Left  Result Date: 11/27/2022 CLINICAL DATA:  Left ankle pain and swelling after falling. EXAM: LEFT ANKLE COMPLETE - 3+ VIEW; LEFT FOOT - COMPLETE 3+ VIEW COMPARISON:  Left ankle radiographs 12/20/2015. FINDINGS: Suspected small fracture of the tibial plafond anteriorly, seen on the lateral views of the ankle and foot. No other evidence of acute fracture or dislocation. The joint spaces are preserved. Mild anterolateral soft tissue swelling at the ankle with a possible small joint effusion. No evidence of acute fracture or dislocation in the foot. No evidence of foreign body or soft tissue emphysema. IMPRESSION: 1. Suspected small fracture of the tibial plafond anteriorly. 2. No evidence of acute fracture or dislocation in the foot. Electronically Signed   By: Carey Bullocks M.D.   On: 11/27/2022 18:52   DG Ankle Complete Left  Result Date: 11/27/2022 CLINICAL DATA:  Left ankle pain and swelling after falling. EXAM: LEFT ANKLE COMPLETE - 3+ VIEW; LEFT FOOT - COMPLETE 3+ VIEW COMPARISON:  Left ankle radiographs 12/20/2015. FINDINGS: Suspected small fracture of the tibial plafond anteriorly, seen on the lateral views of the ankle and foot. No other evidence of acute fracture or dislocation. The joint spaces are preserved. Mild anterolateral soft tissue swelling at the ankle with a possible small joint effusion. No evidence of acute fracture or dislocation in the foot. No evidence of  foreign body or soft tissue emphysema. IMPRESSION: 1. Suspected small fracture of the tibial plafond anteriorly. 2. No evidence of acute fracture or dislocation in the foot. Electronically Signed   By: Carey Bullocks M.D.   On: 11/27/2022 18:52    Review of Systems:   A ROS was performed including pertinent positives and negatives as documented in the HPI.  Physical Exam :   Constitutional: NAD and appears stated age Neurological: Alert and oriented Psych: Appropriate affect and cooperative Last menstrual period 11/13/2022, unknown if currently  breastfeeding.   Comprehensive Musculoskeletal Exam:    Tenderness over anterior ankle and lateral malleolus.  Moderate swelling noted over anterior and lateral ankle without ecchymosis.  Active range of motion to 5 degrees dorsiflexion and 10 degrees plantarflexion.  Negative anterior drawer.  Achilles tendon is mildly tender to palpation with no palpable defect.  Negative Thompson test.  DP/PT pulses 2+.  Distal neurosensory exam intact.  Imaging:   Xray Review from 11/27/22 (Left ankle 3 views): AITFL avulsion. Achilles appears intact. Soft tissue edema.   I personally reviewed and interpreted the radiographs.   Assessment:   27 y.o. female with a left ankle injury after a fall 2 days ago.  Based on x-ray and exam I do suspect an AITFL ligament injury with associated avulsion.  Overall the Achilles appears intact and without significant injury however I do suspect she might have some Achilles tendinitis.  At this point I would recommend short walking boot for the next few days in order to help progress weightbearing as tolerated.  Once she feels more confident with weightbearing I would recommend transitioning into an ankle brace for added stability of the ankle without aggravating the Achilles.  I did also recommend that she could utilize heel lifts in order to offload the Achilles.  Will write meloxicam for pain and inflammation control and  encouraged to continue icing.  Would like to see her back in about 4 weeks for reassessment.  Plan :    -Return to clinic in 4 weeks for reevaluation     I personally saw and evaluated the patient, and participated in the management and treatment plan.  Hazle Nordmann, PA-C Orthopedics  This document was dictated using Conservation officer, historic buildings. A reasonable attempt at proof reading has been made to minimize errors.

## 2022-12-02 ENCOUNTER — Encounter (HOSPITAL_COMMUNITY): Payer: Self-pay | Admitting: Emergency Medicine

## 2022-12-02 ENCOUNTER — Observation Stay (HOSPITAL_COMMUNITY)
Admission: EM | Admit: 2022-12-02 | Discharge: 2022-12-02 | Disposition: A | Discharge status: Home / Self Care | Payer: Medicaid Other | Attending: Internal Medicine | Admitting: Internal Medicine

## 2022-12-02 ENCOUNTER — Emergency Department (HOSPITAL_COMMUNITY): Payer: Medicaid Other

## 2022-12-02 ENCOUNTER — Other Ambulatory Visit: Payer: Self-pay

## 2022-12-02 ENCOUNTER — Encounter (HOSPITAL_COMMUNITY): Payer: Medicaid Other

## 2022-12-02 DIAGNOSIS — J4551 Severe persistent asthma with (acute) exacerbation: Secondary | ICD-10-CM | POA: Diagnosis not present

## 2022-12-02 DIAGNOSIS — Z1152 Encounter for screening for COVID-19: Secondary | ICD-10-CM | POA: Diagnosis not present

## 2022-12-02 DIAGNOSIS — Z79899 Other long term (current) drug therapy: Secondary | ICD-10-CM | POA: Diagnosis not present

## 2022-12-02 DIAGNOSIS — J45901 Unspecified asthma with (acute) exacerbation: Principal | ICD-10-CM | POA: Diagnosis present

## 2022-12-02 DIAGNOSIS — R0682 Tachypnea, not elsewhere classified: Secondary | ICD-10-CM

## 2022-12-02 DIAGNOSIS — R0602 Shortness of breath: Secondary | ICD-10-CM | POA: Diagnosis present

## 2022-12-02 LAB — COMPREHENSIVE METABOLIC PANEL
ALT: 28 U/L (ref 0–44)
AST: 25 U/L (ref 15–41)
Albumin: 3.6 g/dL (ref 3.5–5.0)
Alkaline Phosphatase: 47 U/L (ref 38–126)
Anion gap: 15 (ref 5–15)
BUN: 11 mg/dL (ref 6–20)
CO2: 19 mmol/L — ABNORMAL LOW (ref 22–32)
Calcium: 8.3 mg/dL — ABNORMAL LOW (ref 8.9–10.3)
Chloride: 103 mmol/L (ref 98–111)
Creatinine, Ser: 0.66 mg/dL (ref 0.44–1.00)
GFR, Estimated: >60 mL/min (ref >60)
Glucose, Bld: 164 mg/dL — ABNORMAL HIGH (ref 70–99)
Potassium: 3.2 mmol/L — ABNORMAL LOW (ref 3.5–5.1)
Sodium: 137 mmol/L (ref 135–145)
Total Bilirubin: 0.1 mg/dL — ABNORMAL LOW (ref 0.3–1.2)
Total Protein: 7.2 g/dL (ref 6.5–8.1)

## 2022-12-02 LAB — I-STAT VENOUS BLOOD GAS, ED
Acid-base deficit: 4 mmol/L — ABNORMAL HIGH (ref 0.0–2.0)
Bicarbonate: 19.1 mmol/L — ABNORMAL LOW (ref 20.0–28.0)
Calcium, Ion: 1.08 mmol/L — ABNORMAL LOW (ref 1.15–1.40)
HCT: 38 % (ref 36.0–46.0)
Hemoglobin: 12.9 g/dL (ref 12.0–15.0)
O2 Saturation: 97 %
Potassium: 3.2 mmol/L — ABNORMAL LOW (ref 3.5–5.1)
Sodium: 139 mmol/L (ref 135–145)
TCO2: 20 mmol/L — ABNORMAL LOW (ref 22–32)
pCO2, Ven: 30 mmHg — ABNORMAL LOW (ref 44–60)
pH, Ven: 7.411 (ref 7.25–7.43)
pO2, Ven: 91 mmHg — ABNORMAL HIGH (ref 32–45)

## 2022-12-02 LAB — BASIC METABOLIC PANEL
Anion gap: 11 (ref 5–15)
BUN: 9 mg/dL (ref 6–20)
CO2: 17 mmol/L — ABNORMAL LOW (ref 22–32)
Calcium: 7.6 mg/dL — ABNORMAL LOW (ref 8.9–10.3)
Chloride: 109 mmol/L (ref 98–111)
Creatinine, Ser: 0.72 mg/dL (ref 0.44–1.00)
GFR, Estimated: >60 mL/min (ref >60)
Glucose, Bld: 168 mg/dL — ABNORMAL HIGH (ref 70–99)
Potassium: 3.9 mmol/L (ref 3.5–5.1)
Sodium: 137 mmol/L (ref 135–145)

## 2022-12-02 LAB — HIV ANTIBODY (ROUTINE TESTING W REFLEX): HIV Screen 4th Generation wRfx: NONREACTIVE

## 2022-12-02 LAB — RESP PANEL BY RT-PCR (RSV, FLU A&B, COVID)  RVPGX2
Influenza A by PCR: NEGATIVE
Influenza B by PCR: NEGATIVE
Resp Syncytial Virus by PCR: NEGATIVE
SARS Coronavirus 2 by RT PCR: NEGATIVE

## 2022-12-02 LAB — CBC
HCT: 39.5 % (ref 36.0–46.0)
HCT: 36.3 % (ref 36.0–46.0)
Hemoglobin: 12.8 g/dL (ref 12.0–15.0)
Hemoglobin: 11.7 g/dL — ABNORMAL LOW (ref 12.0–15.0)
MCH: 30.1 pg (ref 26.0–34.0)
MCH: 29.9 pg (ref 26.0–34.0)
MCHC: 32.4 g/dL (ref 30.0–36.0)
MCHC: 32.2 g/dL (ref 30.0–36.0)
MCV: 92.9 fL (ref 80.0–100.0)
MCV: 92.8 fL (ref 80.0–100.0)
Platelets: 268 10*3/uL (ref 150–400)
Platelets: 255 10*3/uL (ref 150–400)
RBC: 4.25 MIL/uL (ref 3.87–5.11)
RBC: 3.91 MIL/uL (ref 3.87–5.11)
RDW: 13.7 % (ref 11.5–15.5)
RDW: 13.9 % (ref 11.5–15.5)
WBC: 14.4 10*3/uL — ABNORMAL HIGH (ref 4.0–10.5)
WBC: 14.5 10*3/uL — ABNORMAL HIGH (ref 4.0–10.5)
nRBC: 0 % (ref 0.0–0.2)
nRBC: 0 % (ref 0.0–0.2)

## 2022-12-02 LAB — I-STAT CHEM 8, ED
BUN: 10 mg/dL (ref 6–20)
Calcium, Ion: 1.09 mmol/L — ABNORMAL LOW (ref 1.15–1.40)
Chloride: 107 mmol/L (ref 98–111)
Creatinine, Ser: 0.5 mg/dL (ref 0.44–1.00)
Glucose, Bld: 166 mg/dL — ABNORMAL HIGH (ref 70–99)
HCT: 38 % (ref 36.0–46.0)
Hemoglobin: 12.9 g/dL (ref 12.0–15.0)
Potassium: 3.2 mmol/L — ABNORMAL LOW (ref 3.5–5.1)
Sodium: 139 mmol/L (ref 135–145)
TCO2: 19 mmol/L — ABNORMAL LOW (ref 22–32)

## 2022-12-02 LAB — I-STAT BETA HCG BLOOD, ED (MC, WL, AP ONLY): I-stat hCG, quantitative: <5 m[IU]/mL (ref <5)

## 2022-12-02 LAB — MAGNESIUM: Magnesium: 2.3 mg/dL (ref 1.7–2.4)

## 2022-12-02 LAB — SARS CORONAVIRUS 2 BY RT PCR: SARS Coronavirus 2 by RT PCR: NEGATIVE

## 2022-12-02 LAB — PHOSPHORUS: Phosphorus: 1.9 mg/dL — ABNORMAL LOW (ref 2.5–4.6)

## 2022-12-02 LAB — GLUCOSE, CAPILLARY: Glucose-Capillary: 175 mg/dL — ABNORMAL HIGH (ref 70–99)

## 2022-12-02 MED ORDER — KETOROLAC TROMETHAMINE 15 MG/ML IJ SOLN
30.0000 mg | Freq: Once | INTRAMUSCULAR | Status: AC
  Administered 2022-12-02: 30 mg via INTRAVENOUS
  Filled 2022-12-02: qty 2

## 2022-12-02 MED ORDER — CLONIDINE HCL 0.2 MG PO TABS: 0.2000 mg | ORAL_TABLET | Freq: Every day | ORAL | Status: DC

## 2022-12-02 MED ORDER — SODIUM CHLORIDE 0.9 % IV BOLUS
1000.0000 mL | Freq: Once | INTRAVENOUS | Status: AC
  Administered 2022-12-02: 1000 mL via INTRAVENOUS

## 2022-12-02 MED ORDER — IOHEXOL 350 MG/ML SOLN
75.0000 mL | Freq: Once | INTRAVENOUS | Status: AC | PRN
  Administered 2022-12-02: 75 mL via INTRAVENOUS

## 2022-12-02 MED ORDER — ALBUTEROL SULFATE (2.5 MG/3ML) 0.083% IN NEBU: 20.0000 mg/h | INHALATION_SOLUTION | Freq: Once | RESPIRATORY_TRACT | Status: DC

## 2022-12-02 MED ORDER — POTASSIUM PHOSPHATES 15 MMOLE/5ML IV SOLN
30.0000 mmol | Freq: Once | INTRAVENOUS | Status: DC
  Filled 2022-12-02: qty 10

## 2022-12-02 MED ORDER — ALBUTEROL SULFATE (2.5 MG/3ML) 0.083% IN NEBU
5.0000 mg | INHALATION_SOLUTION | Freq: Once | RESPIRATORY_TRACT | Status: AC
  Administered 2022-12-02: 5 mg via RESPIRATORY_TRACT

## 2022-12-02 MED ORDER — POTASSIUM CHLORIDE CRYS ER 20 MEQ PO TBCR
40.0000 meq | EXTENDED_RELEASE_TABLET | Freq: Once | ORAL | Status: AC
  Administered 2022-12-02: 40 meq via ORAL
  Filled 2022-12-02: qty 2

## 2022-12-02 MED ORDER — ACETAMINOPHEN 325 MG PO TABS: 650.0000 mg | ORAL_TABLET | Freq: Four times a day (QID) | ORAL | Status: DC | PRN

## 2022-12-02 MED ORDER — LACTATED RINGERS IV SOLN: INTRAVENOUS | Status: DC

## 2022-12-02 MED ORDER — ALPRAZOLAM 0.5 MG PO TABS
1.0000 mg | ORAL_TABLET | Freq: Three times a day (TID) | ORAL | Status: DC | PRN
  Filled 2022-12-02: qty 4

## 2022-12-02 MED ORDER — POTASSIUM CHLORIDE 2 MEQ/ML IV SOLN
INTRAVENOUS | Status: DC
  Filled 2022-12-02: qty 1000

## 2022-12-02 MED ORDER — IPRATROPIUM-ALBUTEROL 0.5-2.5 (3) MG/3ML IN SOLN
3.0000 mL | RESPIRATORY_TRACT | Status: DC
  Administered 2022-12-02 (×2): 3 mL via RESPIRATORY_TRACT
  Filled 2022-12-02 (×2): qty 3

## 2022-12-02 MED ORDER — ENOXAPARIN SODIUM 40 MG/0.4ML IJ SOSY: 40.0000 mg | PREFILLED_SYRINGE | INTRAMUSCULAR | Status: DC

## 2022-12-02 MED ORDER — ALBUTEROL SULFATE (2.5 MG/3ML) 0.083% IN NEBU
INHALATION_SOLUTION | RESPIRATORY_TRACT | Status: AC
  Filled 2022-12-02: qty 6

## 2022-12-02 MED ORDER — ACETAMINOPHEN 500 MG PO TABS
1000.0000 mg | ORAL_TABLET | ORAL | Status: AC
  Administered 2022-12-02: 1000 mg via ORAL
  Filled 2022-12-02: qty 2

## 2022-12-02 MED ORDER — INSULIN ASPART 100 UNIT/ML IJ SOLN
0.0000 [IU] | Freq: Three times a day (TID) | INTRAMUSCULAR | Status: DC
  Administered 2022-12-02: 2 [IU] via SUBCUTANEOUS

## 2022-12-02 MED ORDER — METHYLPREDNISOLONE SODIUM SUCC 125 MG IJ SOLR
80.0000 mg | Freq: Every day | INTRAMUSCULAR | Status: DC
  Administered 2022-12-02: 80 mg via INTRAVENOUS
  Filled 2022-12-02: qty 2

## 2022-12-02 MED ORDER — PROCHLORPERAZINE EDISYLATE 10 MG/2ML IJ SOLN: 5.0000 mg | Freq: Four times a day (QID) | INTRAMUSCULAR | Status: DC | PRN

## 2022-12-02 MED ORDER — CLONIDINE HCL 0.2 MG PO TABS
0.2000 mg | ORAL_TABLET | Freq: Every day | ORAL | Status: AC
  Administered 2022-12-02: 0.2 mg via ORAL
  Filled 2022-12-02: qty 1

## 2022-12-02 MED ORDER — DOXYCYCLINE HYCLATE 100 MG PO TABS: 100.0000 mg | ORAL_TABLET | Freq: Two times a day (BID) | ORAL | 0 refills | Status: AC

## 2022-12-02 MED ORDER — POLYETHYLENE GLYCOL 3350 17 G PO PACK: 17.0000 g | PACK | Freq: Every day | ORAL | Status: DC | PRN

## 2022-12-02 MED ORDER — BUDESONIDE 0.25 MG/2ML IN SUSP
0.2500 mg | Freq: Two times a day (BID) | RESPIRATORY_TRACT | Status: DC
  Administered 2022-12-02: 0.25 mg via RESPIRATORY_TRACT
  Filled 2022-12-02: qty 2

## 2022-12-02 MED ORDER — IPRATROPIUM BROMIDE 0.02 % IN SOLN
0.5000 mg | Freq: Once | RESPIRATORY_TRACT | Status: AC
  Administered 2022-12-02: 0.5 mg via RESPIRATORY_TRACT
  Filled 2022-12-02: qty 2.5

## 2022-12-02 MED ORDER — INSULIN ASPART 100 UNIT/ML IJ SOLN: 0.0000 [IU] | Freq: Every day | INTRAMUSCULAR | Status: DC

## 2022-12-02 MED ORDER — MOMETASONE FURO-FORMOTEROL FUM 200-5 MCG/ACT IN AERO: 2.0000 | INHALATION_SPRAY | Freq: Two times a day (BID) | RESPIRATORY_TRACT | Status: DC

## 2022-12-02 MED ORDER — ALBUTEROL SULFATE (2.5 MG/3ML) 0.083% IN NEBU: 2.5000 mg | INHALATION_SOLUTION | Freq: Four times a day (QID) | RESPIRATORY_TRACT | 1 refills | Status: DC | PRN

## 2022-12-02 MED ORDER — POTASSIUM CHLORIDE 10 MEQ/100ML IV SOLN
10.0000 meq | INTRAVENOUS | Status: AC
  Administered 2022-12-02 (×2): 10 meq via INTRAVENOUS
  Filled 2022-12-02 (×2): qty 100

## 2022-12-02 MED ORDER — PREDNISONE 10 MG PO TABS: ORAL_TABLET | ORAL | 0 refills | Status: DC

## 2022-12-02 NOTE — TOC Transition Note (Signed)
Transition of Care [facility]) - CM/SW Discharge Note   Patient Details  Name: Carol Paul MRN: [id number] Date of Birth: 1995-07-15  [facility] of Care [facility] CM/SW Contact:  Tom-Johnson, Hershal Coria, RN Phone Number: 12/02/2022, 10:26 AM   Clinical Narrative:     Patient is scheduled for discharge today.  Readmission Risk Assessment done. Outpatient f/u, hospital f/u and discharge instructions on AVS. No TOC needs or recommendations noted. Wife, Carol Paul to transport at discharge.  No further TOC needs noted.       Final next level of care: Home/Self Care Barriers to Discharge: Barriers Resolved   Patient Goals and CMS Choice CMS Medicare.gov Compare Post Acute Care list provided to:: Patient Choice offered to / list presented to : NA  Discharge Placement                  Patient to be transferred to facility by: Wife Name of family member notified: Conservation officer, nature    Discharge Plan and Services Additional resources added to the After Visit Summary for                  DME Arranged: N/A DME Agency: NA       HH Arranged: NA HH Agency: NA        Social Determinants of Health (SDOH) Interventions SDOH Screenings   Food Insecurity: No Food Insecurity (08/30/2022)  Housing: Low Risk  (08/30/2022)  Transportation Needs: No Transportation Needs (08/30/2022)  Utilities: Not At Risk (08/30/2022)  Tobacco Use: Medium Risk (12/02/2022)     Readmission Risk Interventions    12/02/2022   10:25 AM  Readmission Risk Prevention Plan  Transportation Screening Complete  PCP or Specialist Appt within 3-5 Days Complete  HRI or Home Care Consult Complete  Social Work Consult for Recovery Care Planning/Counseling Complete  Palliative Care Screening Not Applicable  Medication Review Oceanographer) Referral to Pharmacy

## 2022-12-02 NOTE — Progress Notes (Signed)
RT NOTE:  Pt looking much better after being on BIPAP x 2 hrs. Titrated down to 3L [place]. Tolerating well. SpO2 100%, RR 20.

## 2022-12-02 NOTE — Discharge Summary (Signed)
PATIENT DETAILS Name: Giavanni Kash Menton Age: 27 y.o. Sex: female Date of Birth: Aug 22, 1995 MRN: [id number]. Admitting Physician: Darlin Drop, DO WUJ:[id number], No Pcp Per  Admit Date: 12/02/2022 Discharge date: 12/02/2022  Recommendations for Outpatient Follow-up:  Follow up with PCP in 1-2 weeks Please obtain CMP/CBC in one week A1c/HIV test is pending-please follow-up.  Admitted From:  Home  Disposition: Home health   Discharge Condition: good  CODE STATUS:   Code Status: Full Code   Diet recommendation:  Diet Order             Diet heart healthy/carb modified Room service appropriate? Yes; Fluid consistency: Thin  Diet effective now           Diet general                    Brief Summary: 27 year old female with history of asthma-presented with several days history of shortness of breath-found to have asthma exacerbation and admitted to the hospitalist service.  Brief Hospital Course: Acute hypoxic respiratory failure secondary to asthma exacerbation:  Rapidly improved-with steroids/bronchodilators Transitioned to room air this morning Moving air well but still wheezing-patient requesting that she be discharged home today, she was ambulated by nursing staff and had no major issues with shortness of breath her O2 saturation stayed in the 90s throughout.  Per patient-she has had several bouts of asthma exacerbation-and is very familiar with how to manage them well She will be discharged home at her request-will place on tapering prednisone, she asked that a refill for nebulized albuterol be sent to her pharmacy, will prescribe doxycycline x 3 days.  She lives with her significant other (at bedside)-who will provide supervision, and bring her back to the emergency room if needed.  Both patient and significant other know this is not an ideal situation but since patient is awake/alert and wants to go home-she is being discharged home at her own request.  Recent  small fracture of tibial plafond anteriorly Continue splint-and resume outpatient follow-up with orthopedics  Hypokalemia Repleted  Note-patient apparently had a in-home COVID test positive-COVID PCR x 2 is negative.  Obesity: Estimated body mass index is 36.29 kg/m as calculated from the following:   Height as of 11/27/22: 5\' 2"  (1.575 m).   Weight as of this encounter: 90 kg.    Discharge Diagnoses:  Principal Problem:   Acute asthma exacerbation  Discharge Instructions:  Activity:  As tolerated with Full fall precautions use walker/cane & assistance as needed   Discharge Instructions     Call MD for:  difficulty breathing, headache or visual disturbances   Complete by: As directed    Diet general   Complete by: As directed    Discharge instructions   Complete by: As directed    Follow with Primary MD in 1-2 weeks  Please get a complete blood count and chemistry panel checked by your Primary MD at your next visit, and again as instructed by your Primary MD.  Get Medicines reviewed and adjusted: Please take all your medications with you for your next visit with your Primary MD  Laboratory/radiological data: Please request your Primary MD to go over all hospital tests and procedure/radiological results at the follow up, please ask your Primary MD to get all Hospital records sent to his/her office.  In some cases, they will be blood work, cultures and biopsy results pending at the time of your discharge. Please request that your primary care M.D. follows up on  these results.  Also Note the following: If you experience worsening of your admission symptoms, develop shortness of breath, life threatening emergency, suicidal or homicidal thoughts you must seek medical attention immediately by calling 911 or calling your MD immediately  if symptoms less severe.  You must read complete instructions/literature along with all the possible adverse reactions/side effects for all  the Medicines you take and that have been prescribed to you. Take any new Medicines after you have completely understood and accpet all the possible adverse reactions/side effects.   Do not drive when taking Pain medications or sleeping medications (Benzodaizepines)  Do not take more than prescribed Pain, Sleep and Anxiety Medications. It is not advisable to combine anxiety,sleep and pain medications without talking with your primary care practitioner  Special Instructions: If you have smoked or chewed Tobacco  in the last 2 yrs please stop smoking, stop any regular Alcohol  and or any Recreational drug use.  Wear Seat belts while driving.  Please note: You were cared for by a hospitalist during your hospital stay. Once you are discharged, your primary care physician will handle any further medical issues. Please note that NO REFILLS for any discharge medications will be authorized once you are discharged, as it is imperative that you return to your primary care physician (or establish a relationship with a primary care physician if you do not have one) for your post hospital discharge needs so that they can reassess your need for medications and monitor your lab values.   Increase activity slowly   Complete by: As directed       Allergies as of 12/02/2022       Reactions   Other Other (See Comments)   GI discomfort to wheat, soy, eggs, milk and sesame        Medication List     STOP taking these medications    meloxicam 15 MG tablet Commonly known as: Mobic       TAKE these medications    albuterol 108 (90 Base) MCG/ACT inhaler Commonly known as: VENTOLIN HFA Inhale 1-2 puffs into the lungs every 6 (six) hours as needed for wheezing or shortness of breath. What changed: Another medication with the same name was added. Make sure you understand how and when to take each.   albuterol (2.5 MG/3ML) 0.083% nebulizer solution Commonly known as: PROVENTIL Take 3 mLs (2.5 mg  total) by nebulization every 6 (six) hours as needed for wheezing or shortness of breath. What changed: You were already taking a medication with the same name, and this prescription was added. Make sure you understand how and when to take each.   alprazolam 2 MG tablet Commonly known as: XANAX Take 1 mg by mouth 3 (three) times daily.   amphetamine-dextroamphetamine 20 MG tablet Commonly known as: ADDERALL Take 20 mg by mouth 2 (two) times daily.   cloNIDine 0.1 MG tablet Commonly known as: CATAPRES Take 0.2 mg by mouth at bedtime.   doxycycline 100 MG tablet Commonly known as: VIBRA-TABS Take 1 tablet (100 mg total) by mouth 2 (two) times daily for 3 days.   predniSONE 10 MG tablet Commonly known as: DELTASONE Take 4 tablets (40 mg) daily for 3 days, then, Take 3 tablets (30 mg) daily for 2 days, then, Take 2 tablets (20 mg) daily for 2 days, then, Take 1 tablets (10 mg) daily for 1 days, then stop   Symbicort 160-4.5 MCG/ACT inhaler Generic drug: budesonide-formoterol Inhale 2 puffs into the lungs in  the morning and at bedtime.        Follow-up Information     Anyanwu, Jethro Bastos, MD. Schedule an appointment as soon as possible for a visit in 1 week(s).   Specialty: Obstetrics and Gynecology Contact information: [address] [place] [address] [phone]                Allergies  Allergen Reactions   Other Other (See Comments)    GI discomfort to wheat, soy, eggs, milk and sesame     Other Procedures/Studies: CT Angio Chest PE W/Cm &/Or Wo Cm  Result Date: 12/02/2022 CLINICAL DATA:  Pulmonary embolism suspected, high probability. History of asthma with respiratory distress. Wheezing and coughing. EXAM: CT ANGIOGRAPHY CHEST WITH CONTRAST TECHNIQUE: Multidetector CT imaging of the chest was performed using the standard protocol during bolus administration of intravenous contrast. Multiplanar CT image reconstructions and MIPs were obtained to evaluate the  vascular anatomy. RADIATION DOSE REDUCTION: This exam was performed according to the departmental dose-optimization program which includes automated exposure control, adjustment of the mA and/or kV according to patient size and/or use of iterative reconstruction technique. CONTRAST:  75mL OMNIPAQUE IOHEXOL 350 MG/ML SOLN COMPARISON:  None Available. FINDINGS: Cardiovascular: The heart is normal in size and there is no pericardial effusion. The aorta and pulmonary trunk are normal in caliber. No pulmonary artery filling defect is seen. Examination is limited due to respiratory motion artifact. Mediastinum/Nodes: No mediastinal, axillary, or hilar lymphadenopathy. The thyroid gland, trachea, and esophagus are within normal limits. Lungs/Pleura: Lungs are clear. No pleural effusion or pneumothorax. Upper Abdomen: Fatty infiltration of the liver is noted. No acute abnormality. Musculoskeletal: No acute osseous abnormality. Review of the MIP images confirms the above findings. IMPRESSION: 1. No evidence of pulmonary embolism or other acute process. 2. Hepatic steatosis. Electronically Signed   By: Thornell Sartorius M.D.   On: 12/02/2022 04:29   DG Chest Portable 1 View  Result Date: 12/02/2022 CLINICAL DATA:  Shortness of breath. EXAM: PORTABLE CHEST 1 VIEW COMPARISON:  August 25, 2022 FINDINGS: The heart size and mediastinal contours are within normal limits. Very mild atelectasis is seen within the right lung base. There is no evidence of focal consolidation, pleural effusion or pneumothorax. The visualized skeletal structures are unremarkable. IMPRESSION: Very mild right basilar atelectasis. Electronically Signed   By: Aram Candela M.D.   On: 12/02/2022 01:59   DG Wrist Complete Left  Result Date: 11/27/2022 CLINICAL DATA:  Pain after fall. EXAM: LEFT WRIST - COMPLETE 3+ VIEW COMPARISON:  None Available. FINDINGS: There is no evidence of fracture or dislocation. There is no evidence of arthropathy or other  focal bone abnormality. Soft tissues are unremarkable. IMPRESSION: Negative. Electronically Signed   By: Larose Hires D.O.   On: 11/27/2022 21:16   DG Foot Complete Left  Result Date: 11/27/2022 CLINICAL DATA:  Left ankle pain and swelling after falling. EXAM: LEFT ANKLE COMPLETE - 3+ VIEW; LEFT FOOT - COMPLETE 3+ VIEW COMPARISON:  Left ankle radiographs 12/20/2015. FINDINGS: Suspected small fracture of the tibial plafond anteriorly, seen on the lateral views of the ankle and foot. No other evidence of acute fracture or dislocation. The joint spaces are preserved. Mild anterolateral soft tissue swelling at the ankle with a possible small joint effusion. No evidence of acute fracture or dislocation in the foot. No evidence of foreign body or soft tissue emphysema. IMPRESSION: 1. Suspected small fracture of the tibial plafond anteriorly. 2. No evidence of acute fracture or dislocation  in the foot. Electronically Signed   By: Carey Bullocks M.D.   On: 11/27/2022 18:52   DG Ankle Complete Left  Result Date: 11/27/2022 CLINICAL DATA:  Left ankle pain and swelling after falling. EXAM: LEFT ANKLE COMPLETE - 3+ VIEW; LEFT FOOT - COMPLETE 3+ VIEW COMPARISON:  Left ankle radiographs 12/20/2015. FINDINGS: Suspected small fracture of the tibial plafond anteriorly, seen on the lateral views of the ankle and foot. No other evidence of acute fracture or dislocation. The joint spaces are preserved. Mild anterolateral soft tissue swelling at the ankle with a possible small joint effusion. No evidence of acute fracture or dislocation in the foot. No evidence of foreign body or soft tissue emphysema. IMPRESSION: 1. Suspected small fracture of the tibial plafond anteriorly. 2. No evidence of acute fracture or dislocation in the foot. Electronically Signed   By: Carey Bullocks M.D.   On: 11/27/2022 18:52   US Pelvis Complete  Result Date: 11/11/2022 CLINICAL DATA:  Acute right lower quadrant abdominal pain. EXAM:  TRANSABDOMINAL AND TRANSVAGINAL ULTRASOUND OF PELVIS DOPPLER ULTRASOUND OF OVARIES TECHNIQUE: Both transabdominal and transvaginal ultrasound examinations of the pelvis were performed. Transabdominal technique was performed for global imaging of the pelvis including uterus, ovaries, adnexal regions, and pelvic cul-de-sac. It was necessary to proceed with endovaginal exam following the transabdominal exam to visualize the endometrium. Color and duplex Doppler ultrasound was utilized to evaluate blood flow to the ovaries. COMPARISON:  Ultrasound of August 31, 2022. FINDINGS: Uterus Measurements: 7.8 x 5.0 x 3.2 cm = volume: 66 mL. No fibroids or other mass visualized. Endometrium Thickness: 5 mm which is within normal limits. No focal abnormality visualized. Right ovary Measurements: 2.2 x 1.9 x 1.3 cm = volume: 3 mL. Normal appearance/no adnexal mass. Left ovary Measurements: 3.2 x 2.8 x 1.3 cm = volume: 6 mL. Normal appearance/no adnexal mass. Pulsed Doppler evaluation of both ovaries demonstrates normal low-resistance arterial and venous waveforms. Other findings No abnormal free fluid. IMPRESSION: No evidence of ovarian mass or torsion. No definite abnormality seen in the pelvis. Electronically Signed   By: Lupita Raider M.D.   On: 11/11/2022 16:06   US Transvaginal Non-OB  Result Date: 11/11/2022 CLINICAL DATA:  Acute right lower quadrant abdominal pain. EXAM: TRANSABDOMINAL AND TRANSVAGINAL ULTRASOUND OF PELVIS DOPPLER ULTRASOUND OF OVARIES TECHNIQUE: Both transabdominal and transvaginal ultrasound examinations of the pelvis were performed. Transabdominal technique was performed for global imaging of the pelvis including uterus, ovaries, adnexal regions, and pelvic cul-de-sac. It was necessary to proceed with endovaginal exam following the transabdominal exam to visualize the endometrium. Color and duplex Doppler ultrasound was utilized to evaluate blood flow to the ovaries. COMPARISON:  Ultrasound of  August 31, 2022. FINDINGS: Uterus Measurements: 7.8 x 5.0 x 3.2 cm = volume: 66 mL. No fibroids or other mass visualized. Endometrium Thickness: 5 mm which is within normal limits. No focal abnormality visualized. Right ovary Measurements: 2.2 x 1.9 x 1.3 cm = volume: 3 mL. Normal appearance/no adnexal mass. Left ovary Measurements: 3.2 x 2.8 x 1.3 cm = volume: 6 mL. Normal appearance/no adnexal mass. Pulsed Doppler evaluation of both ovaries demonstrates normal low-resistance arterial and venous waveforms. Other findings No abnormal free fluid. IMPRESSION: No evidence of ovarian mass or torsion. No definite abnormality seen in the pelvis. Electronically Signed   By: Lupita Raider M.D.   On: 11/11/2022 16:06   [place] Art/Ven Flow Abd Pelv Doppler  Result Date: 11/11/2022 CLINICAL DATA:  Acute right lower quadrant abdominal  pain. EXAM: TRANSABDOMINAL AND TRANSVAGINAL ULTRASOUND OF PELVIS DOPPLER ULTRASOUND OF OVARIES TECHNIQUE: Both transabdominal and transvaginal ultrasound examinations of the pelvis were performed. Transabdominal technique was performed for global imaging of the pelvis including uterus, ovaries, adnexal regions, and pelvic cul-de-sac. It was necessary to proceed with endovaginal exam following the transabdominal exam to visualize the endometrium. Color and duplex Doppler ultrasound was utilized to evaluate blood flow to the ovaries. COMPARISON:  Ultrasound of August 31, 2022. FINDINGS: Uterus Measurements: 7.8 x 5.0 x 3.2 cm = volume: 66 mL. No fibroids or other mass visualized. Endometrium Thickness: 5 mm which is within normal limits. No focal abnormality visualized. Right ovary Measurements: 2.2 x 1.9 x 1.3 cm = volume: 3 mL. Normal appearance/no adnexal mass. Left ovary Measurements: 3.2 x 2.8 x 1.3 cm = volume: 6 mL. Normal appearance/no adnexal mass. Pulsed Doppler evaluation of both ovaries demonstrates normal low-resistance arterial and venous waveforms. Other findings No abnormal free  fluid. IMPRESSION: No evidence of ovarian mass or torsion. No definite abnormality seen in the pelvis. Electronically Signed   By: Lupita Raider M.D.   On: 11/11/2022 16:06     TODAY-DAY OF DISCHARGE:  Subjective:   Carol Paul today has no headache,no chest abdominal pain,no new weakness tingling or numbness, feels much better wants to go home today.   Objective:   Blood pressure 129/70, pulse (!) 127, temperature (!) 97.3 F (36.3 C), temperature source Oral, resp. rate 15, weight 90 kg, last menstrual period 11/13/2022, SpO2 97 %, unknown if currently breastfeeding.  Intake/Output Summary (Last 24 hours) at 12/02/2022 0935 Last data filed at 12/02/2022 0311 Gross per 24 hour  Intake 1000 ml  Output --  Net 1000 ml   Filed Weights   12/02/22 0121  Weight: 90 kg    Exam: Awake Alert, Oriented *3, No new F.N deficits, Normal affect Abie.AT,PERRAL Supple Neck,No JVD, No cervical lymphadenopathy appriciated.  Symmetrical Chest wall movement, Good air movement bilaterally, CTAB RRR,No Gallops,Rubs or new Murmurs, No Parasternal Heave +ve B.Sounds, Abd Soft, Non tender, No organomegaly appriciated, No rebound -guarding or rigidity. No Cyanosis, Clubbing or edema, No new Rash or bruise   PERTINENT RADIOLOGIC STUDIES: CT Angio Chest PE W/Cm &/Or Wo Cm  Result Date: 12/02/2022 CLINICAL DATA:  Pulmonary embolism suspected, high probability. History of asthma with respiratory distress. Wheezing and coughing. EXAM: CT ANGIOGRAPHY CHEST WITH CONTRAST TECHNIQUE: Multidetector CT imaging of the chest was performed using the standard protocol during bolus administration of intravenous contrast. Multiplanar CT image reconstructions and MIPs were obtained to evaluate the vascular anatomy. RADIATION DOSE REDUCTION: This exam was performed according to the departmental dose-optimization program which includes automated exposure control, adjustment of the mA and/or kV according to patient size  and/or use of iterative reconstruction technique. CONTRAST:  75mL OMNIPAQUE IOHEXOL 350 MG/ML SOLN COMPARISON:  None Available. FINDINGS: Cardiovascular: The heart is normal in size and there is no pericardial effusion. The aorta and pulmonary trunk are normal in caliber. No pulmonary artery filling defect is seen. Examination is limited due to respiratory motion artifact. Mediastinum/Nodes: No mediastinal, axillary, or hilar lymphadenopathy. The thyroid gland, trachea, and esophagus are within normal limits. Lungs/Pleura: Lungs are clear. No pleural effusion or pneumothorax. Upper Abdomen: Fatty infiltration of the liver is noted. No acute abnormality. Musculoskeletal: No acute osseous abnormality. Review of the MIP images confirms the above findings. IMPRESSION: 1. No evidence of pulmonary embolism or other acute process. 2. Hepatic steatosis. Electronically Signed   By: Vernona Rieger  Ladona Ridgel M.D.   On: 12/02/2022 04:29   DG Chest Portable 1 View  Result Date: 12/02/2022 CLINICAL DATA:  Shortness of breath. EXAM: PORTABLE CHEST 1 VIEW COMPARISON:  August 25, 2022 FINDINGS: The heart size and mediastinal contours are within normal limits. Very mild atelectasis is seen within the right lung base. There is no evidence of focal consolidation, pleural effusion or pneumothorax. The visualized skeletal structures are unremarkable. IMPRESSION: Very mild right basilar atelectasis. Electronically Signed   By: Aram Candela M.D.   On: 12/02/2022 01:59     PERTINENT LAB RESULTS: CBC: Recent Labs    12/02/22 0124 12/02/22 0131 12/02/22 0132 12/02/22 0430  WBC 14.4*  --   --  14.5*  HGB 12.8   < > 12.9 11.7*  HCT 39.5   < > 38.0 36.3  PLT 268  --   --  255   < > = values in this interval not displayed.   CMET CMP     Component Value Date/Time   NA 137 12/02/2022 0430   K 3.9 12/02/2022 0430   CL 109 12/02/2022 0430   CO2 17 (L) 12/02/2022 0430   GLUCOSE 168 (H) 12/02/2022 0430   BUN 9 12/02/2022 0430    CREATININE 0.72 12/02/2022 0430   CALCIUM 7.6 (L) 12/02/2022 0430   PROT 7.2 12/02/2022 0124   ALBUMIN 3.6 12/02/2022 0124   AST 25 12/02/2022 0124   ALT 28 12/02/2022 0124   ALKPHOS 47 12/02/2022 0124   BILITOT 0.1 (L) 12/02/2022 0124   GFRNONAA >60 12/02/2022 0430   GFRAA >60 04/07/2020 1624    GFR Estimated Creatinine Clearance: 113.2 mL/min (by C-G formula based on SCr of 0.72 mg/dL). No results for input(s): "LIPASE", "AMYLASE" in the last 72 hours. No results for input(s): "CKTOTAL", "CKMB", "CKMBINDEX", "TROPONINI" in the last 72 hours. Invalid input(s): "POCBNP" No results for input(s): "DDIMER" in the last 72 hours. No results for input(s): "HGBA1C" in the last 72 hours. No results for input(s): "CHOL", "HDL", "LDLCALC", "TRIG", "CHOLHDL", "LDLDIRECT" in the last 72 hours. No results for input(s): "TSH", "T4TOTAL", "T3FREE", "THYROIDAB" in the last 72 hours.  Invalid input(s): "FREET3" No results for input(s): "VITAMINB12", "FOLATE", "FERRITIN", "TIBC", "IRON", "RETICCTPCT" in the last 72 hours. Coags: No results for input(s): "INR" in the last 72 hours.  Invalid input(s): "PT" Microbiology: Recent Results (from the past 240 hour(s))  Resp panel by RT-PCR (RSV, Flu A&B, Covid) Anterior Nasal Swab     Status: None   Collection Time: 12/02/22  1:50 AM   Specimen: Anterior Nasal Swab  Result Value Ref Range Status   SARS Coronavirus 2 by RT PCR NEGATIVE NEGATIVE Final   Influenza A by PCR NEGATIVE NEGATIVE Final   Influenza B by PCR NEGATIVE NEGATIVE Final    Comment: (NOTE) The Xpert Xpress SARS-CoV-2/FLU/RSV plus assay is intended as an aid in the diagnosis of influenza from Nasopharyngeal swab specimens and should not be used as a sole basis for treatment. Nasal washings and aspirates are unacceptable for Xpert Xpress SARS-CoV-2/FLU/RSV testing.  Fact Sheet for Patients: BloggerCourse.com  Fact Sheet for Healthcare  Providers: SeriousBroker.it  This test is not yet approved or cleared by the [place] FDA and has been authorized for detection and/or diagnosis of SARS-CoV-2 by FDA under an Emergency Use Authorization (EUA). This EUA will remain in effect (meaning this test can be used) for the duration of the COVID-19 declaration under Section 564(b)(1) of the Act, 21 U.S.C. section 360bbb-3(b)(1), unless the  authorization is terminated or revoked.     Resp Syncytial Virus by PCR NEGATIVE NEGATIVE Final    Comment: (NOTE) Fact Sheet for Patients: BloggerCourse.com  Fact Sheet for Healthcare Providers: SeriousBroker.it  This test is not yet approved or cleared by the [place] FDA and has been authorized for detection and/or diagnosis of SARS-CoV-2 by FDA under an Emergency Use Authorization (EUA). This EUA will remain in effect (meaning this test can be used) for the duration of the COVID-19 declaration under Section 564(b)(1) of the Act, 21 U.S.C. section 360bbb-3(b)(1), unless the authorization is terminated or revoked.  Performed at [facility], [address] [place], [place] [address]   SARS Coronavirus 2 by RT PCR (hospital order, performed in [facility] hospital lab) *cepheid single result test* Anterior Nasal Swab     Status: None   Collection Time: 12/02/22  5:54 AM   Specimen: Anterior Nasal Swab  Result Value Ref Range Status   SARS Coronavirus 2 by RT PCR NEGATIVE NEGATIVE Final    Comment: Performed at [facility], [address], [place], [place] [address]    FURTHER DISCHARGE INSTRUCTIONS:  Get Medicines reviewed and adjusted: Please take all your medications with you for your next visit with your Primary MD  Laboratory/radiological data: Please request your Primary MD to go over all hospital tests and procedure/radiological results at the follow up, please ask your Primary  MD to get all Hospital records sent to his/her office.  In some cases, they will be blood work, cultures and biopsy results pending at the time of your discharge. Please request that your primary care M.D. goes through all the records of your hospital data and follows up on these results.  Also Note the following: If you experience worsening of your admission symptoms, develop shortness of breath, life threatening emergency, suicidal or homicidal thoughts you must seek medical attention immediately by calling 911 or calling your MD immediately  if symptoms less severe.  You must read complete instructions/literature along with all the possible adverse reactions/side effects for all the Medicines you take and that have been prescribed to you. Take any new Medicines after you have completely understood and accpet all the possible adverse reactions/side effects.   Do not drive when taking Pain medications or sleeping medications (Benzodaizepines)  Do not take more than prescribed Pain, Sleep and Anxiety Medications. It is not advisable to combine anxiety,sleep and pain medications without talking with your primary care practitioner  Special Instructions: If you have smoked or chewed Tobacco  in the last 2 yrs please stop smoking, stop any regular Alcohol  and or any Recreational drug use.  Wear Seat belts while driving.  Please note: You were cared for by a hospitalist during your hospital stay. Once you are discharged, your primary care physician will handle any further medical issues. Please note that NO REFILLS for any discharge medications will be authorized once you are discharged, as it is imperative that you return to your primary care physician (or establish a relationship with a primary care physician if you do not have one) for your post hospital discharge needs so that they can reassess your need for medications and monitor your lab values.  Total Time spent coordinating discharge  including counseling, education and face to face time equals greater than 30 minutes.  SignedJeoffrey Massed 12/02/2022 9:35 AM

## 2022-12-02 NOTE — Progress Notes (Signed)
Carol Paul is alert and oriented x4. Ambulated [name] on RA while partial weight bearing to left foot due to to achilles injury. Gait steady on ambulation to bathroom and back to bed. No complaints of SOB during ambulation on RA. Then ambulated pt from bed to sink, again with no complaints of SOB on RA. Oxygen saturation at 93% throughout ambulation and ADL care.

## 2022-12-02 NOTE — ED Triage Notes (Addendum)
This patient with a h/o asthma presents from home for respiratory distress x 3 hours. Covid+ x 3 days. EMS arrived to pt wheezing, tripoding, coughing.  Endorses fever 4 neb treatments by patient at home EMS provided 1mg  atrovent, 10albuterol, 125mg  solumedrol, 2G Magnesium.  EMS vitals: 164/120, 170bpm, 94% RA  No h/o intubation d/t asthma.

## 2022-12-02 NOTE — H&P (Signed)
History and Physical  Carol Paul:[id number] DOB: [birth year] DOA: 12/02/2022  Referring physician: Solon Augusta, PA-EDP  PCP: Carol Paul, No Pcp Per  Outpatient Specialists: None Carol Paul coming from: Home  Chief Complaint: Shortness of breath   HPI: Carol Paul is a 27 y.o. female with medical history significant for asthma, hypertension, type 2 diabetes, generalized anxiety disorder, ADHD, recent left lower extremity fracture, who presented to [facility] ED via EMS from home due to acute respiratory distress 3 hours prior to presentation.  Reportedly had a positive COVID test 3 days ago.  EMS was activated.  Upon EMS arrival, the Carol Paul was noted to be wheezing, tripoding, and coughing.  The Carol Paul used her rescue inhaler and nebulizer without improvement.  She received 1 mg of Atrovent, 10 mg of albuterol, 125 mg of IV Solu-Medrol, and 2 g of magnesium en route by EMS.  In the ED, tachycardic with heart rate of 170 and tachypneic respiration rate of 35.  The Carol Paul was placed on BiPAP due to increased work of breathing.  She remains on BiPAP for couple of hours with improvement of her symptomatology.  Was weaned off BiPAP to 3 L nasal cannula in the ED.  COVID-19 screening test by PCR negative.  The Carol Paul received additional doses of nebulizers and IV Solu-Medrol.  Due to persistent tachycardia and severe symptoms with concern for possible pulmonary embolism, a CT angio chest was ordered and is pending.  TR[facility], hospitalist service, was asked to admit.   ED Course: Temperature 98.  BP 119/77, pulse 134, respiration 16, O2 saturation 99% on 3 L.  Lab studies notable for serum potassium 3.2, serum glucose 166, WBC 14.4.  Review of Systems: Review of systems as noted in the HPI. All other systems reviewed and are negative.   Past Medical History:  Diagnosis Date   Asthma    Past Surgical History:  Procedure Laterality Date   ANKLE SURGERY Left    TONSILLECTOMY      Social  History:  reports that she has quit smoking. She has been exposed to tobacco smoke. She has never used smokeless tobacco. She reports that she does not currently use alcohol. She reports that she does not currently use drugs after having used the following drugs: Marijuana.   Allergies  Allergen Reactions   Other Other (See Comments)    GI discomfort to wheat, soy, eggs, milk and sesame    Family History  Problem Relation Age of Onset   CAD Other    Diabetes Mother       Prior to Admission medications   Medication Sig Start Date End Date Taking? Authorizing Provider  albuterol (VENTOLIN HFA) 108 (90 Base) MCG/ACT inhaler Inhale 1-2 puffs into the lungs every 6 (six) hours as needed for wheezing or shortness of breath. 04/07/20  Yes Henderly, Britni A, PA-C  alprazolam (XANAX) 2 MG tablet Take 1 mg by mouth 3 (three) times daily.   Yes [provider]  amphetamine-dextroamphetamine (ADDERALL) 20 MG tablet Take 20 mg by mouth 2 (two) times daily. 09/20/22  Yes [provider]  cloNIDine (CATAPRES) 0.1 MG tablet Take 0.2 mg by mouth at bedtime. 10/30/22  Yes [provider]  SYMBICORT 160-4.5 MCG/ACT inhaler Inhale 2 puffs into the lungs in the morning and at bedtime. 09/12/22  Yes [provider]  meloxicam (MOBIC) 15 MG tablet Take 1 tablet (15 mg total) by mouth daily for 7 days. Carol Paul not taking: Reported on 12/02/2022 11/29/22 12/06/22  Amador Cunas, PA-C    Physical Exam: BP 127/87   Pulse (!) 130   Temp 98 F (36.7 C) (Axillary)   Resp 13   Wt 90 kg   LMP 11/13/2022 (Exact Date)   SpO2 100%   BMI 36.29 kg/m   General: 27 y.o. year-old female well developed well nourished in no acute distress.  Alert and oriented x3. Cardiovascular: Tachycardic with no rubs or gallops.  No thyromegaly or JVD noted.   Respiratory: Diffuse wheezing bilaterally.  Poor inspiratory effort. Abdomen: Soft nontender nondistended with normal bowel sounds x4  quadrants. Muskuloskeletal: No cyanosis or clubbing.  Left lower extremity edema and pain. Neuro: CN II-XII intact, strength, sensation, reflexes Skin: No ulcerative lesions noted or rashes Psychiatry: Judgement and insight appear normal. Mood is appropriate for condition and setting          Labs on Admission:  Basic Metabolic Panel: Recent Labs  Lab 12/02/22 0124 12/02/22 0131 12/02/22 0132  NA 137 139 139  K 3.2* 3.2* 3.2*  CL 103 107  --   CO2 19*  --   --   GLUCOSE 164* 166*  --   BUN 11 10  --   CREATININE 0.66 0.50  --   CALCIUM 8.3*  --   --    Liver Function Tests: Recent Labs  Lab 12/02/22 0124  AST 25  ALT 28  ALKPHOS 47  BILITOT 0.1*  PROT 7.2  ALBUMIN 3.6   No results for input(s): "LIPASE", "AMYLASE" in the last 168 hours. No results for input(s): "AMMONIA" in the last 168 hours. CBC: Recent Labs  Lab 12/02/22 0124 12/02/22 0131 12/02/22 0132  WBC 14.4*  --   --   HGB 12.8 12.9 12.9  HCT 39.5 38.0 38.0  MCV 92.9  --   --   PLT 268  --   --    Cardiac Enzymes: No results for input(s): "CKTOTAL", "CKMB", "CKMBINDEX", "TROPONINI" in the last 168 hours.  BNP (last 3 results) Recent Labs    08/25/22 1505 08/26/22 0519 08/27/22 0843  BNP 120.5* 115.1* 67.0    ProBNP (last 3 results) No results for input(s): "PROBNP" in the last 8760 hours.  CBG: No results for input(s): "GLUCAP" in the last 168 hours.  Radiological Exams on Admission: DG Chest Portable 1 View  Result Date: 12/02/2022 CLINICAL DATA:  Shortness of breath. EXAM: PORTABLE CHEST 1 VIEW COMPARISON:  August 25, 2022 FINDINGS: The heart size and mediastinal contours are within normal limits. Very mild atelectasis is seen within the right lung base. There is no evidence of focal consolidation, pleural effusion or pneumothorax. The visualized skeletal structures are unremarkable. IMPRESSION: Very mild right basilar atelectasis. Electronically Signed   By: Aram Candela M.D.    On: 12/02/2022 01:59    EKG: I independently viewed the EKG done and my findings are as followed: Sinus tachycardia rate of 181.  Nonspecific ST-T changes.  QTc 498.  Assessment/Plan Present on Admission:  Acute asthma exacerbation  Principal Problem:   Acute asthma exacerbation  Acute asthma exacerbation, unclear trigger Reportedly tested positive for COVID-19 at home 3 days ago COVID-19 screening test by PCR negative Continue IV Solu-Medrol 40 mg twice daily Continue DuoNeb every 4 hours Home asthma regimen Early mobilization Incentive spirometer  Acute hypoxic respiratory failure secondary to above Rule out pulmonary embolism Initially on BiPAP, weaned off to 3 L nasal cannula to maintain O2 saturation above 90% Not on oxygen supplementation at baseline Wean  off oxygen supplementation as tolerated Incentive spirometer Early mobilization  Hyperglycemia, likely exacerbated by IV Solu-Medrol Obtain hemoglobin A1c Heart healthy carb modified diet Start insulin sliding scale  Hypokalemia Serum potassium 3.2 Repleted orally. Check magnesium level  QTc prolongation Admission twelve-lead EKG with QTc of 498 Avoid QTc prolonging agents Optimize magnesium level greater than 2.0 Optimize potassium level greater than 4.0. Monitor on telemetry.  Leukocytosis, possibly reactive No evidence of infiltrates on chest x-ray. Follow CTA chest  Recent left lower extremity fracture post fall Edema and tenderness on exam, rule out DVT    DVT prophylaxis: Subcu Lovenox daily.  Code Status: Full code.  Family Communication: Updated the Carol Paul's wife at bedside.  Disposition Plan: Admitted to telemetry medical unit  Consults called: None.  Admission status: Inpatient status.   Status is: Inpatient The Carol Paul requires at least 2 midnights for further evaluation and treatment of present condition.   Darlin Drop MD [facility] Pager [phone]  If  7PM-7AM, please contact night-coverage [email or web address] Password [email or web address]  12/02/2022, 3:52 AM

## 2022-12-02 NOTE — ED Notes (Signed)
ED TO INPATIENT HANDOFF REPORT  ED Nurse Name and Phone #: [phone]  S Name/Age/Gender Carol Paul 27 y.o. female Room/Bed: TRACC/TRACC  Code Status   Code Status: Full Code  Home/SNF/Other Home Patient oriented to: self, place, time, and situation Is this baseline? Yes   Triage Complete: Triage complete  Chief Complaint Acute asthma exacerbation [J45.901]  Triage Note This patient with a h/o asthma presents from home for respiratory distress x 3 hours. Covid+ x 3 days. EMS arrived to pt wheezing, tripoding, coughing.  Endorses fever 4 neb treatments by patient at home EMS provided 1mg  atrovent, 10albuterol, 125mg  solumedrol, 2G Magnesium.  EMS vitals: 164/120, 170bpm, 94% RA  No h/o intubation d/t asthma.    Allergies Allergies  Allergen Reactions   Other Other (See Comments)    GI discomfort to wheat, soy, eggs, milk and sesame    Level of Care/Admitting Diagnosis ED Disposition     ED Disposition  Admit   Condition  --   Comment  Hospital Area: [facility] [[id number]]  Level of Care: Telemetry Medical [104]  May admit patient to [facility] or [facility] if equivalent level of care is available:: No  Covid Evaluation: Asymptomatic - no recent exposure (last 10 days) testing not required  Diagnosis: Acute asthma exacerbation [409811]  Admitting Physician: Darlin Drop [[id number]]  Attending Physician: Darlin Drop [[id number]]  Certification:: I certify this patient will need inpatient services for at least 2 midnights  Estimated Length of Stay: 2          B Medical/Surgery History Past Medical History:  Diagnosis Date   Asthma    Past Surgical History:  Procedure Laterality Date   ANKLE SURGERY Left    TONSILLECTOMY       A IV Location/Drains/Wounds Patient Lines/Drains/Airways Status     Active Line/Drains/Airways     Name Placement date Placement time Site Days   Peripheral IV 12/02/22 18 G Anterior;Proximal;Right  Forearm 12/02/22  0145  Forearm  less than 1            Intake/Output Last 24 hours  Intake/Output Summary (Last 24 hours) at 12/02/2022 0440 Last data filed at 12/02/2022 6578 Gross per 24 hour  Intake 1000 ml  Output --  Net 1000 ml    Labs/Imaging Results for orders placed or performed during the hospital encounter of 12/02/22 (from the past 48 hour(s))  CBC     Status: Abnormal   Collection Time: 12/02/22  1:24 AM  Result Value Ref Range   WBC 14.4 (H) 4.0 - 10.5 K/uL   RBC 4.25 3.87 - 5.11 MIL/uL   Hemoglobin 12.8 12.0 - 15.0 g/dL   HCT 46.9 62.9 - 52.8 %   MCV 92.9 80.0 - 100.0 fL   MCH 30.1 26.0 - 34.0 pg   MCHC 32.4 30.0 - 36.0 g/dL   RDW 41.3 24.4 - 01.0 %   Platelets 268 150 - 400 K/uL   nRBC 0.0 0.0 - 0.2 %    Comment: Performed at [facility], [address] [place], [place] [address]  Comprehensive metabolic panel     Status: Abnormal   Collection Time: 12/02/22  1:24 AM  Result Value Ref Range   Sodium 137 135 - 145 mmol/L   Potassium 3.2 (L) 3.5 - 5.1 mmol/L   Chloride 103 98 - 111 mmol/L   CO2 19 (L) 22 - 32 mmol/L   Glucose, Bld 164 (H) 70 - 99  mg/dL    Comment: Glucose reference range applies only to samples taken after fasting for at least 8 hours.   BUN 11 6 - 20 mg/dL   Creatinine, Ser 5.40 0.44 - 1.00 mg/dL   Calcium 8.3 (L) 8.9 - 10.3 mg/dL   Total Protein 7.2 6.5 - 8.1 g/dL   Albumin 3.6 3.5 - 5.0 g/dL   AST 25 15 - 41 U/L   ALT 28 0 - 44 U/L   Alkaline Phosphatase 47 38 - 126 U/L   Total Bilirubin 0.1 (L) 0.3 - 1.2 mg/dL   GFR, Estimated >98 >11 mL/min    Comment: (NOTE) Calculated using the CKD-EPI Creatinine Equation (2021)    Anion gap 15 5 - 15    Comment: Performed at [facility], [address], [place], [place] [address]  I-Stat beta hCG blood, ED ([facility], [facility], [facility] only)     Status: None   Collection Time: 12/02/22  1:30 AM  Result Value Ref Range   I-stat hCG, quantitative <5.0 <5 mIU/mL   Comment 3             Comment:   GEST. AGE      CONC.  (mIU/mL)   <=1 WEEK        5 - 50     2 WEEKS       50 - 500     3 WEEKS       100 - 10,000     4 WEEKS     1,000 - 30,000        FEMALE AND NON-PREGNANT FEMALE:     LESS THAN 5 mIU/mL   I-stat chem 8, ed     Status: Abnormal   Collection Time: 12/02/22  1:31 AM  Result Value Ref Range   Sodium 139 135 - 145 mmol/L   Potassium 3.2 (L) 3.5 - 5.1 mmol/L   Chloride 107 98 - 111 mmol/L   BUN 10 6 - 20 mg/dL   Creatinine, Ser 2.95 0.44 - 1.00 mg/dL   Glucose, Bld 621 (H) 70 - 99 mg/dL    Comment: Glucose reference range applies only to samples taken after fasting for at least 8 hours.   Calcium, Ion 1.09 (L) 1.15 - 1.40 mmol/L   TCO2 19 (L) 22 - 32 mmol/L   Hemoglobin 12.9 12.0 - 15.0 g/dL   HCT 30.8 65.7 - 84.6 %  I-Stat venous blood gas, ED     Status: Abnormal   Collection Time: 12/02/22  1:32 AM  Result Value Ref Range   pH, Ven 7.411 7.25 - 7.43   pCO2, Ven 30.0 (L) 44 - 60 mmHg   pO2, Ven 91 (H) 32 - 45 mmHg   Bicarbonate 19.1 (L) 20.0 - 28.0 mmol/L   TCO2 20 (L) 22 - 32 mmol/L   O2 Saturation 97 %   Acid-base deficit 4.0 (H) 0.0 - 2.0 mmol/L   Sodium 139 135 - 145 mmol/L   Potassium 3.2 (L) 3.5 - 5.1 mmol/L   Calcium, Ion 1.08 (L) 1.15 - 1.40 mmol/L   HCT 38.0 36.0 - 46.0 %   Hemoglobin 12.9 12.0 - 15.0 g/dL   Sample type VENOUS   Resp panel by RT-PCR (RSV, Flu A&B, Covid) Anterior Nasal Swab     Status: None   Collection Time: 12/02/22  1:50 AM   Specimen: Anterior Nasal Swab  Result Value Ref Range   SARS Coronavirus 2 by RT PCR NEGATIVE NEGATIVE  Influenza A by PCR NEGATIVE NEGATIVE   Influenza B by PCR NEGATIVE NEGATIVE    Comment: (NOTE) The Xpert Xpress SARS-CoV-2/FLU/RSV plus assay is intended as an aid in the diagnosis of influenza from Nasopharyngeal swab specimens and should not be used as a sole basis for treatment. Nasal washings and aspirates are unacceptable for Xpert Xpress SARS-CoV-2/FLU/RSV testing.  Fact Sheet  for Patients: BloggerCourse.com  Fact Sheet for Healthcare Providers: SeriousBroker.it  This test is not yet approved or cleared by the [place] FDA and has been authorized for detection and/or diagnosis of SARS-CoV-2 by FDA under an Emergency Use Authorization (EUA). This EUA will remain in effect (meaning this test can be used) for the duration of the COVID-19 declaration under Section 564(b)(1) of the Act, 21 U.S.C. section 360bbb-3(b)(1), unless the authorization is terminated or revoked.     Resp Syncytial Virus by PCR NEGATIVE NEGATIVE    Comment: (NOTE) Fact Sheet for Patients: BloggerCourse.com  Fact Sheet for Healthcare Providers: SeriousBroker.it  This test is not yet approved or cleared by the [place] FDA and has been authorized for detection and/or diagnosis of SARS-CoV-2 by FDA under an Emergency Use Authorization (EUA). This EUA will remain in effect (meaning this test can be used) for the duration of the COVID-19 declaration under Section 564(b)(1) of the Act, 21 U.S.C. section 360bbb-3(b)(1), unless the authorization is terminated or revoked.  Performed at [facility], [address], [place], [place] [address]    CT Angio Chest PE W/Cm &/Or Wo Cm  Result Date: 12/02/2022 CLINICAL DATA:  Pulmonary embolism suspected, high probability. History of asthma with respiratory distress. Wheezing and coughing. EXAM: CT ANGIOGRAPHY CHEST WITH CONTRAST TECHNIQUE: Multidetector CT imaging of the chest was performed using the standard protocol during bolus administration of intravenous contrast. Multiplanar CT image reconstructions and MIPs were obtained to evaluate the vascular anatomy. RADIATION DOSE REDUCTION: This exam was performed according to the departmental dose-optimization program which includes automated exposure control, adjustment of the mA and/or  kV according to patient size and/or use of iterative reconstruction technique. CONTRAST:  75mL OMNIPAQUE IOHEXOL 350 MG/ML SOLN COMPARISON:  None Available. FINDINGS: Cardiovascular: The heart is normal in size and there is no pericardial effusion. The aorta and pulmonary trunk are normal in caliber. No pulmonary artery filling defect is seen. Examination is limited due to respiratory motion artifact. Mediastinum/Nodes: No mediastinal, axillary, or hilar lymphadenopathy. The thyroid gland, trachea, and esophagus are within normal limits. Lungs/Pleura: Lungs are clear. No pleural effusion or pneumothorax. Upper Abdomen: Fatty infiltration of the liver is noted. No acute abnormality. Musculoskeletal: No acute osseous abnormality. Review of the MIP images confirms the above findings. IMPRESSION: 1. No evidence of pulmonary embolism or other acute process. 2. Hepatic steatosis. Electronically Signed   By: Thornell Sartorius M.D.   On: 12/02/2022 04:29   DG Chest Portable 1 View  Result Date: 12/02/2022 CLINICAL DATA:  Shortness of breath. EXAM: PORTABLE CHEST 1 VIEW COMPARISON:  August 25, 2022 FINDINGS: The heart size and mediastinal contours are within normal limits. Very mild atelectasis is seen within the right lung base. There is no evidence of focal consolidation, pleural effusion or pneumothorax. The visualized skeletal structures are unremarkable. IMPRESSION: Very mild right basilar atelectasis. Electronically Signed   By: Aram Candela M.D.   On: 12/02/2022 01:59    Pending Labs Unresulted Labs (From admission, onward)     Start     Ordered   12/09/22 0500  Creatinine, serum  (  enoxaparin (LOVENOX)    CrCl >/= 30 ml/min)  Weekly,   R     Comments: while on enoxaparin therapy    12/02/22 0344   12/02/22 0500  CBC  Tomorrow morning,   R        12/02/22 0345   12/02/22 0500  Basic metabolic panel  Tomorrow morning,   R        12/02/22 0345   12/02/22 0500  Magnesium  Tomorrow morning,   R         12/02/22 0345   12/02/22 0500  Phosphorus  Tomorrow morning,   R        12/02/22 0345   12/02/22 0500  Hemoglobin A1c  Tomorrow morning,   R       Comments: To assess prior glycemic control    12/02/22 0351   12/02/22 0346  HIV Antibody (routine testing w rflx)  (HIV Antibody (Routine testing w reflex) panel)  Tomorrow morning,   R        12/02/22 0345            Vitals/Pain Today's Vitals   12/02/22 0403 12/02/22 0415 12/02/22 0420 12/02/22 0430  BP: 119/77 (!) 115/94 130/86 121/74  Pulse: (!) 135 (!) 131 (!) 129 (!) 132  Resp: 18 13  13   Temp:      TempSrc:      SpO2: 99% 99% 98% 98%  Weight:      PainSc:        Isolation Precautions No active isolations  Medications Medications  albuterol (PROVENTIL) (2.5 MG/3ML) 0.083% nebulizer solution (0 mg/hr Nebulization Hold 12/02/22 0249)  enoxaparin (LOVENOX) injection 40 mg (has no administration in time range)  cloNIDine (CATAPRES) tablet 0.2 mg (has no administration in time range)  ALPRAZolam (XANAX) tablet 1 mg (has no administration in time range)  mometasone-formoterol (DULERA) 200-5 MCG/ACT inhaler 2 puff (has no administration in time range)  ipratropium-albuterol (DUONEB) 0.5-2.5 (3) MG/3ML nebulizer solution 3 mL (3 mLs Nebulization Given 12/02/22 0424)  methylPREDNISolone sodium succinate (SOLU-MEDROL) 125 mg/2 mL injection 80 mg (80 mg Intravenous Given 12/02/22 0415)  insulin aspart (novoLOG) injection 0-9 Units (has no administration in time range)  insulin aspart (novoLOG) injection 0-5 Units (has no administration in time range)  albuterol (PROVENTIL) (2.5 MG/3ML) 0.083% nebulizer solution 5 mg ( Nebulization Canceled Entry 12/02/22 0146)  ipratropium (ATROVENT) nebulizer solution 0.5 mg (0.5 mg Nebulization Given 12/02/22 0136)  acetaminophen (TYLENOL) tablet 1,000 mg (1,000 mg Oral Given 12/02/22 0145)  sodium chloride 0.9 % bolus 1,000 mL (0 mLs Intravenous Stopped 12/02/22 0311)  ketorolac (TORADOL) 15 MG/ML  injection 30 mg (30 mg Intravenous Given 12/02/22 0146)  potassium chloride SA (KLOR-CON M) CR tablet 40 mEq (40 mEq Oral Given 12/02/22 0235)  potassium chloride 10 mEq in 100 mL IVPB (0 mEq Intravenous Stopped 12/02/22 0336)  potassium chloride SA (KLOR-CON M) CR tablet 40 mEq (40 mEq Oral Given 12/02/22 0415)  cloNIDine (CATAPRES) tablet 0.2 mg (0.2 mg Oral Given 12/02/22 0424)  iohexol (OMNIPAQUE) 350 MG/ML injection 75 mL (75 mLs Intravenous Contrast Given 12/02/22 0409)    Mobility walks     Focused Assessments Cardiac Assessment Handoff:  Cardiac Rhythm: Sinus tachycardia No results found for: "CKTOTAL", "CKMB", "CKMBINDEX", "TROPONINI" Lab Results  Component Value Date   DDIMER 9.63 (H) 08/30/2022   Does the Patient currently have chest pain? Yes   , Pulmonary Assessment Handoff:  Lung sounds: Bilateral Breath Sounds: Expiratory wheezes, Fine crackles O2  Device: Nasal Cannula O2 Flow Rate (L/min): 2 L/min    R Recommendations: See Admitting Provider Note  Report given to:   Additional Notes: She was removed from the bipap around 1615 and is tolerating well on 2L. The clonidine and xanax she received are medications she routinely takes at night. Wife at bedside.

## 2022-12-02 NOTE — ED Provider Notes (Signed)
[place] EMERGENCY DEPARTMENT AT [facility] Provider Note   CSN: [id number] Arrival date & time: 12/02/22  0118     History  Chief Complaint  Patient presents with   Respiratory Distress    Covid+    Kayori Holzmann Jacobs is a 27 y.o. female.  HPI Patient is a 27 year old female with a past medical history significant for asthma presents emergency room today with complaints of 4 days of cough fatigue and 3 days ago was diagnosed with COVID-19.  She states that she has had progressively worsening dyspnea which culminated in severe dyspnea 1 hour before arrival in the emergency department.  She attempted to self treat with 2 nebulizer treatments however became more dyspneic EMS arrived and transported to emergency room provided her with 125 of Solu-Medrol, 2 g of magnesium, 2 DuoNebs.  She states she has never been intubated before.  She states she is coughing some but no hemoptysis.  She did break her left fibula recently and is currently wearing a walking boot.     Home Medications Prior to Admission medications   Medication Sig Start Date End Date Taking? Authorizing Provider  albuterol (VENTOLIN HFA) 108 (90 Base) MCG/ACT inhaler Inhale 1-2 puffs into the lungs every 6 (six) hours as needed for wheezing or shortness of breath. 04/07/20   Henderly, Britni A, PA-C  docusate sodium (COLACE) 100 MG capsule Take 1 capsule (100 mg total) by mouth daily. 08/30/22   Anyanwu, Jethro Bastos, MD  ferrous sulfate 325 (65 FE) MG EC tablet Take 1 tablet (325 mg total) by mouth every other day. 08/31/22   Reva Bores, MD  ferrous sulfate 325 (65 FE) MG tablet Take 1 tablet (325 mg total) by mouth every other day. 08/30/22   Anyanwu, Jethro Bastos, MD  hydrOXYzine (ATARAX) 25 MG tablet Take 1 tablet (25 mg total) by mouth 3 (three) times daily as needed for anxiety. 08/29/22   Anyanwu, Jethro Bastos, MD  ibuprofen (ADVIL) 600 MG tablet Take 1 tablet (600 mg total) by mouth every 6 (six) hours. 08/31/22    Reva Bores, MD  meloxicam (MOBIC) 15 MG tablet Take 1 tablet (15 mg total) by mouth daily for 7 days. 11/29/22 12/06/22  Amador Cunas, PA-C  prochlorperazine (COMPAZINE) 10 MG tablet Take 1 tablet (10 mg total) by mouth every 6 (six) hours as needed for nausea or vomiting. 08/29/22   Anyanwu, Jethro Bastos, MD  progesterone (PROMETRIUM) 200 MG capsule Place 1 capsule (200 mg total) vaginally at bedtime. 08/29/22   Anyanwu, Jethro Bastos, MD  promethazine (PHENERGAN) 25 MG suppository Place 1 suppository (25 mg total) rectally every 8 (eight) hours as needed for up to 12 doses for nausea or vomiting. 01/10/21   Terald Sleeper, MD      Allergies    Other    Review of Systems   Review of Systems  Physical Exam Updated Vital Signs BP (!) 165/122 (BP Location: Right Arm)   Pulse (!) 170   Temp 100.3 F (37.9 C) (Axillary)   Resp (!) 35   Wt 90 kg   LMP 11/13/2022 (Exact Date)   SpO2 99%   BMI 36.29 kg/m  Physical Exam Vitals and nursing note reviewed.  Constitutional:      General: She is in acute distress.     Appearance: She is ill-appearing.  HENT:     Head: Normocephalic and atraumatic.     Nose: Nose normal.     Mouth/Throat:  Mouth: Mucous membranes are dry.  Eyes:     General: No scleral icterus. Cardiovascular:     Rate and Rhythm: Normal rate and regular rhythm.     Pulses: Normal pulses.     Heart sounds: Normal heart sounds.  Pulmonary:     Effort: Respiratory distress present.     Breath sounds: Wheezing present.     Comments: Patient is tachypneic, speaking in very short sentences, extremely increased work of breathing, prolonged expiration phase in comparison to inspiratory phase.  Decreased air movement Abdominal:     Palpations: Abdomen is soft.     Tenderness: There is no abdominal tenderness. There is no guarding or rebound.  Musculoskeletal:     Cervical back: Normal range of motion.     Right lower leg: No edema.     Left lower leg: No edema.      Comments: Left ankle and walking boot  Legs are symmetric  Skin:    General: Skin is warm and dry.     Capillary Refill: Capillary refill takes less than 2 seconds.  Neurological:     Mental Status: She is alert. Mental status is at baseline.  Psychiatric:        Mood and Affect: Mood normal.        Behavior: Behavior normal.     ED Results / Procedures / Treatments   Labs (all labs ordered are listed, but only abnormal results are displayed) Labs Reviewed  I-STAT CHEM 8, ED - Abnormal; Notable for the following components:      Result Value   Potassium 3.2 (*)    Glucose, Bld 166 (*)    Calcium, Ion 1.09 (*)    TCO2 19 (*)    All other components within normal limits  I-STAT VENOUS BLOOD GAS, ED - Abnormal; Notable for the following components:   pCO2, Ven 30.0 (*)    pO2, Ven 91 (*)    Bicarbonate 19.1 (*)    TCO2 20 (*)    Acid-base deficit 4.0 (*)    Potassium 3.2 (*)    Calcium, Ion 1.08 (*)    All other components within normal limits  RESP PANEL BY RT-PCR (RSV, FLU A&B, COVID)  RVPGX2  CBC  COMPREHENSIVE METABOLIC PANEL  I-STAT BETA HCG BLOOD, ED ([facility], [facility], [facility] ONLY)    EKG None  Radiology No results found.  Procedures .Critical Care  Performed by: Gailen Shelter, PA Authorized by: Gailen Shelter, PA   Critical care provider statement:    Critical care time (minutes):  35   Critical care time was exclusive of:  Separately billable procedures and treating other patients and teaching time   Critical care was necessary to treat or prevent imminent or life-threatening deterioration of the following conditions:  Respiratory failure   Critical care was time spent personally by me on the following activities:  Development of treatment plan with patient or surrogate, review of old charts, re-evaluation of patient's condition, pulse oximetry, ordering and review of radiographic studies, ordering and review of laboratory studies, ordering and performing treatments  and interventions, obtaining history from patient or surrogate, examination of patient and evaluation of patient's response to treatment   Care discussed with: admitting provider       Medications Ordered in ED Medications  acetaminophen (TYLENOL) tablet 1,000 mg (has no administration in time range)  sodium chloride 0.9 % bolus 1,000 mL (has no administration in time range)  ketorolac (TORADOL) 15 MG/ML injection 30  mg (has no administration in time range)  albuterol (PROVENTIL) (2.5 MG/3ML) 0.083% nebulizer solution 5 mg (5 mg Nebulization Given 12/02/22 0127)  ipratropium (ATROVENT) nebulizer solution 0.5 mg (0.5 mg Nebulization Given 12/02/22 0136)    ED Course/ Medical Decision Making/ A&P Clinical Course as of 12/02/22 0138  Mon Dec 02, 2022  0132 Patient is a 27 year old female with a past medical history significant for asthma tested positive for COVID 3 days ago has had fevers and shortness of breath that worsened today brought to the emergency room by ambulance she had done 2 nebulizer treatments at home and received 2 DuoNebs [WF]    Clinical Course User Index [WF] Gailen Shelter, [place]                             Medical Decision Making Amount and/or Complexity of Data Reviewed Labs: ordered. Radiology: ordered.  Risk OTC drugs. Prescription drug management. Decision regarding hospitalization.    This patient presents to the ED for concern of SOB, this involves a number of treatment options, and is a complaint that carries with it a high risk of complications and morbidity. A differential diagnosis was considered for the patient's symptoms which is discussed below:   The causes for shortness of breath include but are not limited to Cardiac (AHF, pericardial effusion and tamponade, arrhythmias, ischemia, etc) Respiratory (COPD, asthma, pneumonia, pneumothorax, primary pulmonary hypertension, PE/VQ mismatch) Hematological (anemia) Neuromuscular (ALS, Guillain-Barr,  etc)    Co morbidities: Discussed in HPI   Brief History:  Patient is a 27 year old female with a past medical history significant for asthma presents emergency room today with complaints of 4 days of cough fatigue and 3 days ago was diagnosed with COVID-19.  She states that she has had progressively worsening dyspnea which culminated in severe dyspnea 1 hour before arrival in the emergency department.  She attempted to self treat with 2 nebulizer treatments however became more dyspneic EMS arrived and transported to emergency room provided her with 125 of Solu-Medrol, 2 g of magnesium, 2 DuoNebs.  She states she has never been intubated before.  She states she is coughing some but no hemoptysis.  She did break her left fibula recently and is currently wearing a walking boot.    EMR reviewed including pt PMHx, past surgical history and past visits to ER.   See HPI for more details   Lab Tests:  I ordered and independently interpreted labs. Labs notable for  CMP with mild hypokalemia likely related to the significant amount of albuterol that patient has received.  CBC with leukocytosis nonspecific in the setting of patient's physiologic distress I-STAT VBG without evidence of hypercarbia in fact patient is somewhat low pCO2 likely due to tachypnea.  COVID RSV influenza negative, i-STAT hCG negative for pregnancy.  Imaging Studies:  NAD. I personally reviewed all imaging studies and no acute abnormality found. I agree with radiology interpretation. ?atelectasis  CXR 1 IMPRESSION:  Very mild right basilar atelectasis.     Cardiac Monitoring:  The patient was maintained on a cardiac monitor.  I personally viewed and interpreted the cardiac monitored which showed an underlying rhythm of: sinus tach EKG non-ischemic sinus tach   Medicines ordered:  I ordered medication including p.o. potassium, IV potassium for hypokalemia, Toradol, Tylenol, beta agonism and Atrovent for  dyspnea Reevaluation of the patient after these medicines showed that the patient improved I have reviewed the patients home medicines and  have made adjustments as needed   Critical Interventions:  bipap, tx of asthma   Consults/Attending Physician    Raphael Gibney of hospitalist will admit   Reevaluation:  After the interventions noted above I re-evaluated patient and found that they have :improved   Social Determinants of Health:      Problem List / ED Course:  Severely dyspneic 27 year old female here with acute asthma exacerbation has significantly improved with medications and BiPAP.  Feels much improved now work of breathing much improved still tachycardic likely from albuterol and fever.  Tachycardia has improved with treatment of fever.  PE scan negative, respiratory panel negative however given CT without pneumonia I have a higher suspicion for viral illness. Hypokalemia in the setting of beta agonism likely redistribution of potassium.  Given some supplementation here she will require additional treatment.  Admitted to hospital service   Dispostion:  After consideration of the diagnostic results and the patients response to treatment, I feel that the patent would benefit from    Final Clinical Impression(s) / ED Diagnoses Final diagnoses:  Severe persistent asthma with exacerbation  Tachypnea    Rx / DC Orders ED Discharge Orders     None         Gailen Shelter, [place] 12/02/22 0518    Dione Booze, MD 12/02/22 [phone]

## 2022-12-02 NOTE — Progress Notes (Signed)
Brief note Seen and examined this morning-reports significant clinical improvement and wants to discharge home this morning On exam-she still has some wheezing but is comfortable Suspect she would benefit from 1 additional day in the hospital however patient would like to go home.  I asked the RN to see if we could ambulate her and see how she does-if she is able to move around without any major issues-she will be discharged home at her own request.  Patient claims she is very familiar with asthma exacerbations and has dealt with this multiple times in the past.  She already has a nebulizer and other bronchodilators at home. Potential discharge home at her request with close supervision by her spouse-if she is able to mobilize without any major issues.

## 2022-12-03 LAB — HEMOGLOBIN A1C
Hgb A1c MFr Bld: 6 % — ABNORMAL HIGH (ref 4.8–5.6)
Mean Plasma Glucose: 126 mg/dL

## 2022-12-27 ENCOUNTER — Ambulatory Visit (HOSPITAL_BASED_OUTPATIENT_CLINIC_OR_DEPARTMENT_OTHER): Payer: Medicaid Other | Admitting: Student

## 2023-01-15 ENCOUNTER — Inpatient Hospital Stay (HOSPITAL_COMMUNITY): Payer: Medicaid Other

## 2023-01-15 ENCOUNTER — Inpatient Hospital Stay (HOSPITAL_COMMUNITY)
Admission: AD | Admit: 2023-01-15 | Discharge: 2023-01-15 | Disposition: A | Discharge status: Home / Self Care | Payer: Medicaid Other | Attending: Obstetrics & Gynecology | Admitting: Obstetrics & Gynecology

## 2023-01-15 ENCOUNTER — Encounter (HOSPITAL_COMMUNITY): Payer: Self-pay | Admitting: *Deleted

## 2023-01-15 DIAGNOSIS — O99891 Other specified diseases and conditions complicating pregnancy: Secondary | ICD-10-CM | POA: Diagnosis present

## 2023-01-15 DIAGNOSIS — O219 Vomiting of pregnancy, unspecified: Secondary | ICD-10-CM

## 2023-01-15 DIAGNOSIS — R109 Unspecified abdominal pain: Secondary | ICD-10-CM | POA: Insufficient documentation

## 2023-01-15 DIAGNOSIS — O26891 Other specified pregnancy related conditions, first trimester: Secondary | ICD-10-CM | POA: Insufficient documentation

## 2023-01-15 DIAGNOSIS — R7303 Prediabetes: Secondary | ICD-10-CM

## 2023-01-15 DIAGNOSIS — R030 Elevated blood-pressure reading, without diagnosis of hypertension: Secondary | ICD-10-CM | POA: Diagnosis not present

## 2023-01-15 DIAGNOSIS — Z3A01 Less than 8 weeks gestation of pregnancy: Secondary | ICD-10-CM

## 2023-01-15 DIAGNOSIS — R112 Nausea with vomiting, unspecified: Secondary | ICD-10-CM | POA: Diagnosis not present

## 2023-01-15 DIAGNOSIS — O3680X0 Pregnancy with inconclusive fetal viability, not applicable or unspecified: Secondary | ICD-10-CM

## 2023-01-15 LAB — COMPREHENSIVE METABOLIC PANEL
ALT: 26 U/L (ref 0–44)
AST: 23 U/L (ref 15–41)
Albumin: 3.3 g/dL — ABNORMAL LOW (ref 3.5–5.0)
Alkaline Phosphatase: 61 U/L (ref 38–126)
Anion gap: 10 (ref 5–15)
BUN: 9 mg/dL (ref 6–20)
CO2: 20 mmol/L — ABNORMAL LOW (ref 22–32)
Calcium: 9.2 mg/dL (ref 8.9–10.3)
Chloride: 105 mmol/L (ref 98–111)
Creatinine, Ser: 0.64 mg/dL (ref 0.44–1.00)
GFR, Estimated: >60 mL/min (ref >60)
Glucose, Bld: 150 mg/dL — ABNORMAL HIGH (ref 70–99)
Potassium: 4.3 mmol/L (ref 3.5–5.1)
Sodium: 135 mmol/L (ref 135–145)
Total Bilirubin: 0.1 mg/dL — ABNORMAL LOW (ref 0.3–1.2)
Total Protein: 7.3 g/dL (ref 6.5–8.1)

## 2023-01-15 LAB — ABO/RH: ABO/RH(D): A POS

## 2023-01-15 LAB — URINALYSIS, ROUTINE W REFLEX MICROSCOPIC
Bilirubin Urine: NEGATIVE
Glucose, UA: 50 mg/dL — AB
Ketones, ur: NEGATIVE mg/dL
Leukocytes,Ua: NEGATIVE
Nitrite: NEGATIVE
Protein, ur: NEGATIVE mg/dL
Specific Gravity, Urine: 1.02 (ref 1.005–1.030)
pH: 5 (ref 5.0–8.0)

## 2023-01-15 LAB — POCT PREGNANCY, URINE: Preg Test, Ur: POSITIVE — AB

## 2023-01-15 LAB — CBC
HCT: 38.1 % (ref 36.0–46.0)
Hemoglobin: 12.4 g/dL (ref 12.0–15.0)
MCH: 30.8 pg (ref 26.0–34.0)
MCHC: 32.5 g/dL (ref 30.0–36.0)
MCV: 94.5 fL (ref 80.0–100.0)
Platelets: 326 10*3/uL (ref 150–400)
RBC: 4.03 MIL/uL (ref 3.87–5.11)
RDW: 14.5 % (ref 11.5–15.5)
WBC: 16.2 10*3/uL — ABNORMAL HIGH (ref 4.0–10.5)
nRBC: 0 % (ref 0.0–0.2)

## 2023-01-15 LAB — HCG, QUANTITATIVE, PREGNANCY: hCG, Beta Chain, Quant, S: 437 m[IU]/mL — ABNORMAL HIGH (ref <5)

## 2023-01-15 LAB — TSH: TSH: 3.023 u[IU]/mL (ref 0.350–4.500)

## 2023-01-15 LAB — HEMOGLOBIN A1C
Hgb A1c MFr Bld: 6.1 % — ABNORMAL HIGH (ref 4.8–5.6)
Mean Plasma Glucose: 128.37 mg/dL

## 2023-01-15 MED ORDER — METFORMIN HCL 500 MG PO TABS: 500.0000 mg | ORAL_TABLET | Freq: Two times a day (BID) | ORAL | 5 refills | Status: DC

## 2023-01-15 MED ORDER — PROMETHAZINE HCL 25 MG RE SUPP: 25.0000 mg | Freq: Four times a day (QID) | RECTAL | 5 refills | Status: AC | PRN

## 2023-01-15 MED ORDER — PROMETHAZINE HCL 25 MG/ML IJ SOLN
25.0000 mg | Freq: Once | INTRAVENOUS | Status: AC
  Administered 2023-01-15: 25 mg via INTRAVENOUS
  Filled 2023-01-15: qty 1

## 2023-01-15 MED ORDER — VENTOLIN HFA 108 (90 BASE) MCG/ACT IN AERS: 1.0000 | INHALATION_SPRAY | RESPIRATORY_TRACT | 5 refills | Status: DC | PRN

## 2023-01-15 MED ORDER — PROMETHAZINE HCL 25 MG PO TABS: 25.0000 mg | ORAL_TABLET | Freq: Four times a day (QID) | ORAL | 5 refills | Status: AC | PRN

## 2023-01-15 MED ORDER — ALBUTEROL SULFATE (2.5 MG/3ML) 0.083% IN NEBU: 2.5000 mg | INHALATION_SOLUTION | Freq: Four times a day (QID) | RESPIRATORY_TRACT | 1 refills | Status: DC | PRN

## 2023-01-15 NOTE — MAU Note (Signed)
.  Carol Paul is a 27 y.o. at Unknown here in MAU reporting: found out she was pregnant last week.  Stated last time she was in the hospital she was given insilin ( not diagnosed with diabetes) reported she felt "off last night and her GBG was 250. She took her blood sugar again this morning and it was 195.  Pt stated she is feeling dizzy and  nauseated.  LMP: 12/16/22 Onset of complaint: last night Pain score: 0 Vitals:   01/15/23 0754  BP: (!) 136/99  Pulse: (!) 132  Resp: 18  Temp: 98.3 F (36.8 C)     FHT:n/a  Lab orders placed from triage:  upt,u/a

## 2023-01-15 NOTE — MAU Provider Note (Signed)
History     [id number]  Arrival date and time: 01/15/23 0736    Chief Complaint  Patient presents with   Blood Sugar Problem     HPI Carol Paul is a 27 y.o. at [redacted]w[redacted]d by sure LMP with PMHx notable for ADHD, bipolar, anxiety/MDD, recent miscarriage, PPH, who presents for multiple symptoms.   Review of discharge summary from last admission on 12/02/2022: admitted for severe respiratory distress thought to be asthma attack. At that time A1c was 6.0%. Discharged with steroid pack. Also had admission in 08/2022 for 17wk delivery of twins with PPH requiring transfusion of 4u pRBC.  Patient reports a few days of feeling unwell She has been nauseous and vomiting Has also had centralized pelvic pain that would come in waves but that was about a week ago, none currently Also having intermittent R shoulder pain and is worried this is a sign of an ectopic Has felt dizzy and fatigued as well Has been checking sugars at home with a glucometer she bought, got value of ~250 last night and then this morning was still 170's despite being fasting Also worried about weight gain and possible issues with her thyroid Also worried about miscarriage, as she recently lost a twin gestation No vaginal bleeding at present   --/--/A POS (07/10 0920)  OB History     Gravida  2   Para  1   Term  0   Preterm  1   AB  1   Living  1      SAB  0   IAB  0   Ectopic  0   Multiple  1   Live Births  1           Past Medical History:  Diagnosis Date   Asthma     Past Surgical History:  Procedure Laterality Date   ANKLE SURGERY Left    TONSILLECTOMY      Family History  Problem Relation Age of Onset   CAD Other    Diabetes Mother     Social History   Socioeconomic History   Marital status: Married    Spouse name: Lequita Asal   Number of children: Not on file   Years of education: Not on file   Highest education level: Not on file  Occupational History   Not on file   Tobacco Use   Smoking status: Former    Passive exposure: Current   Smokeless tobacco: Never  Vaping Use   Vaping Use: Never used  Substance and Sexual Activity   Alcohol use: Not Currently    Comment: social   Drug use: Not Currently    Types: Marijuana    Comment: daily   Sexual activity: Not Currently    Birth control/protection: None  Other Topics Concern   Not on file  Social History Narrative   Not on file   Social Determinants of Health   Financial Resource Strain: Not on file  Food Insecurity: No Food Insecurity (08/30/2022)   Hunger Vital Sign    Worried About Running Out of Food in the Last Year: Never true    Ran Out of Food in the Last Year: Never true  Transportation Needs: No Transportation Needs (08/30/2022)   PRAPARE - Transportation    Lack of Transportation (Medical): No    Lack of Transportation (Non-Medical): No  Physical Activity: Not on file  Stress: Not on file  Social Connections: Not on file  Intimate Partner Violence: Not At  Risk (08/30/2022)   Humiliation, Afraid, Rape, and Kick questionnaire    Fear of Current or Ex-Partner: No    Emotionally Abused: No    Physically Abused: No    Sexually Abused: No    Allergies  Allergen Reactions   Other Other (See Comments)    GI discomfort to wheat, soy, eggs, milk and sesame    No current facility-administered medications on file prior to encounter.   Current Outpatient Medications on File Prior to Encounter  Medication Sig Dispense Refill   alprazolam (XANAX) 2 MG tablet Take 1 mg by mouth 3 (three) times daily.     amphetamine-dextroamphetamine (ADDERALL) 20 MG tablet Take 20 mg by mouth 2 (two) times daily.     cloNIDine (CATAPRES) 0.1 MG tablet Take 0.2 mg by mouth at bedtime.     predniSONE (DELTASONE) 10 MG tablet Take 4 tablets (40 mg) daily for 3 days, then, Take 3 tablets (30 mg) daily for 2 days, then, Take 2 tablets (20 mg) daily for 2 days, then, Take 1 tablets (10 mg) daily for 1  days, then stop 23 tablet 0   SYMBICORT 160-4.5 MCG/ACT inhaler Inhale 2 puffs into the lungs in the morning and at bedtime.       ROS Pertinent positives and negative per HPI, all others reviewed and negative  Physical Exam   BP (!) 125/92   Pulse (!) 129   Temp 98.3 F (36.8 C)   Resp 18   Ht 5\' 2"  (1.575 m)   Wt 106.6 kg   LMP 12/16/2022   BMI 42.98 kg/m   Patient Vitals for the past 24 hrs:  BP Temp Pulse Resp Height Weight  01/15/23 0806 (!) 125/92 -- (!) 129 -- -- --  01/15/23 0754 (!) 136/99 98.3 F (36.8 C) (!) 132 18 5\' 2"  (1.575 m) 106.6 kg    Physical Exam Vitals reviewed.  Constitutional:      General: She is not in acute distress.    Appearance: She is well-developed. She is not diaphoretic.  Eyes:     General: No scleral icterus. Pulmonary:     Effort: Pulmonary effort is normal. No respiratory distress.  Abdominal:     General: There is no distension.     Palpations: Abdomen is soft.     Tenderness: There is no abdominal tenderness. There is no guarding or rebound.  Skin:    General: Skin is warm and dry.  Neurological:     Mental Status: She is alert.     Coordination: Coordination normal.      Cervical Exam    Bedside Ultrasound Pt informed that the ultrasound is considered a limited OB ultrasound and is not intended to be a complete ultrasound exam.  Patient also informed that the ultrasound is not being completed with the intent of assessing for fetal or placental anomalies or any pelvic abnormalities.  Explained that the purpose of today's ultrasound is to assess for  viability.  Patient acknowledges the purpose of the exam and the limitations of the study.      My interpretation: First trimester findings: Intrauterine gestational sac seen: no, some thickening of endometrium   Labs Results for orders placed or performed during the hospital encounter of 01/15/23 (from the past 24 hour(s))  Pregnancy, urine POC     Status: Abnormal    Collection Time: 01/15/23  7:46 AM  Result Value Ref Range   Preg Test, Ur POSITIVE (A) NEGATIVE  Urinalysis, Routine w reflex  microscopic -Urine, Clean Catch     Status: Abnormal   Collection Time: 01/15/23  8:04 AM  Result Value Ref Range   Color, Urine YELLOW YELLOW   APPearance CLOUDY (A) CLEAR   Specific Gravity, Urine 1.020 1.005 - 1.030   pH 5.0 5.0 - 8.0   Glucose, UA 50 (A) NEGATIVE mg/dL   Hgb urine dipstick SMALL (A) NEGATIVE   Bilirubin Urine NEGATIVE NEGATIVE   Ketones, ur NEGATIVE NEGATIVE mg/dL   Protein, ur NEGATIVE NEGATIVE mg/dL   Nitrite NEGATIVE NEGATIVE   Leukocytes,Ua NEGATIVE NEGATIVE   RBC / HPF 11-20 0 - 5 RBC/hpf   WBC, UA 0-5 0 - 5 WBC/hpf   Bacteria, UA RARE (A) NONE SEEN   Squamous Epithelial / HPF 6-10 0 - 5 /HPF   Mucus PRESENT   CBC     Status: Abnormal   Collection Time: 01/15/23  9:20 AM  Result Value Ref Range   WBC 16.2 (H) 4.0 - 10.5 K/uL   RBC 4.03 3.87 - 5.11 MIL/uL   Hemoglobin 12.4 12.0 - 15.0 g/dL   HCT 16.1 09.6 - 04.5 %   MCV 94.5 80.0 - 100.0 fL   MCH 30.8 26.0 - 34.0 pg   MCHC 32.5 30.0 - 36.0 g/dL   RDW 40.9 81.1 - 91.4 %   Platelets 326 150 - 400 K/uL   nRBC 0.0 0.0 - 0.2 %  Comprehensive metabolic panel     Status: Abnormal   Collection Time: 01/15/23  9:20 AM  Result Value Ref Range   Sodium 135 135 - 145 mmol/L   Potassium 4.3 3.5 - 5.1 mmol/L   Chloride 105 98 - 111 mmol/L   CO2 20 (L) 22 - 32 mmol/L   Glucose, Bld 150 (H) 70 - 99 mg/dL   BUN 9 6 - 20 mg/dL   Creatinine, Ser 7.82 0.44 - 1.00 mg/dL   Calcium 9.2 8.9 - 95.6 mg/dL   Total Protein 7.3 6.5 - 8.1 g/dL   Albumin 3.3 (L) 3.5 - 5.0 g/dL   AST 23 15 - 41 U/L   ALT 26 0 - 44 U/L   Alkaline Phosphatase 61 38 - 126 U/L   Total Bilirubin 0.1 (L) 0.3 - 1.2 mg/dL   GFR, Estimated >21 >30 mL/min   Anion gap 10 5 - 15  hCG, quantitative, pregnancy     Status: Abnormal   Collection Time: 01/15/23  9:20 AM  Result Value Ref Range   hCG, Beta Chain, Quant, S 437  (H) <5 mIU/mL  ABO/Rh     Status: None   Collection Time: 01/15/23  9:20 AM  Result Value Ref Range   ABO/RH(D) A POS    No rh immune globuloin      NOT A RH IMMUNE GLOBULIN CANDIDATE, PT RH POSITIVE Performed at [facility], [address], [place], [place] [address]   Hemoglobin A1c     Status: Abnormal   Collection Time: 01/15/23  9:20 AM  Result Value Ref Range   Hgb A1c MFr Bld 6.1 (H) 4.8 - 5.6 %   Mean Plasma Glucose 128.37 mg/dL  TSH     Status: None   Collection Time: 01/15/23  9:20 AM  Result Value Ref Range   TSH 3.023 0.350 - 4.500 uIU/mL    Imaging No results found.  MAU Course  Procedures Lab Orders         Urinalysis, Routine w reflex microscopic -Urine, Clean Catch  CBC         Comprehensive metabolic panel         hCG, quantitative, pregnancy         Hemoglobin A1c         TSH         Beta hCG quant (ref lab)         2Hr GTT w/ 1 Hr Carpenter 75 g         Pregnancy, urine POC    Meds ordered this encounter  Medications   promethazine (PHENERGAN) 25 mg in lactated ringers 1,000 mL infusion   albuterol (VENTOLIN HFA) 108 (90 Base) MCG/ACT inhaler    Sig: Inhale 1-2 puffs into the lungs every 4 (four) hours as needed for wheezing or shortness of breath.    Dispense:  54 g    Refill:  5   albuterol (PROVENTIL) (2.5 MG/3ML) 0.083% nebulizer solution    Sig: Take 3 mLs (2.5 mg total) by nebulization every 6 (six) hours as needed for wheezing or shortness of breath.    Dispense:  75 mL    Refill:  1   promethazine (PHENERGAN) 25 MG tablet    Sig: Take 1 tablet (25 mg total) by mouth every 6 (six) hours as needed for nausea or vomiting.    Dispense:  30 tablet    Refill:  5   promethazine (PHENERGAN) 25 MG suppository    Sig: Place 1 suppository (25 mg total) rectally every 6 (six) hours as needed for nausea or vomiting.    Dispense:  12 each    Refill:  5   metFORMIN (GLUCOPHAGE) 500 MG tablet    Sig: Take 1 tablet (500 mg total) by mouth  2 (two) times daily with a meal.    Dispense:  60 tablet    Refill:  5   Imaging Orders         US OB LESS THAN 14 WEEKS WITH OB TRANSVAGINAL     MDM Moderate (Level 3-4)  Assessment and Plan  #Nausea and vomiting #Intermittent abdominal pain #[redacted] weeks gestation of pregnancy US shows thickened endometrium, hcg 437, discussed with patient that I think her symptoms are all related to early pregnancy. No evidence of ectopic but given intermittent symptoms and patient anxiety regarding health of pregnancy will send for repeat hcg in 48 hours at [facility].   #Prediabetes A1c 6.1%, discussed symptoms unlikely related to her blood sugars but do recommend we do an early GTT along with her hcg in 48 hours to assess. Does need treatment. Will give prescription for metformin in hand but instructed patient not to take it until she hears back results from GTT.   #Elevated blood pressure without diagnosis of hypertension First time, I do not see any elevated BP's on review of past few years of healthcare encounters.      Dispo: discharged to home in stable condition    Venora Maples, MD/MPH 01/15/23 11:11 AM  Allergies as of 01/15/2023       Reactions   Other Other (See Comments)   GI discomfort to wheat, soy, eggs, milk and sesame        Medication List     STOP taking these medications    predniSONE 10 MG tablet Commonly known as: DELTASONE       TAKE these medications    alprazolam 2 MG tablet Commonly known as: XANAX Take 1 mg by mouth 3 (three) times daily.   amphetamine-dextroamphetamine 20  MG tablet Commonly known as: ADDERALL Take 20 mg by mouth 2 (two) times daily.   cloNIDine 0.1 MG tablet Commonly known as: CATAPRES Take 0.2 mg by mouth at bedtime.   metFORMIN 500 MG tablet Commonly known as: GLUCOPHAGE Take 1 tablet (500 mg total) by mouth 2 (two) times daily with a meal.   promethazine 25 MG tablet Commonly known as: PHENERGAN Take 1 tablet (25 mg  total) by mouth every 6 (six) hours as needed for nausea or vomiting.   promethazine 25 MG suppository Commonly known as: PHENERGAN Place 1 suppository (25 mg total) rectally every 6 (six) hours as needed for nausea or vomiting.   Symbicort 160-4.5 MCG/ACT inhaler Generic drug: budesonide-formoterol Inhale 2 puffs into the lungs in the morning and at bedtime.   Ventolin HFA 108 (90 Base) MCG/ACT inhaler Generic drug: albuterol Inhale 1-2 puffs into the lungs every 4 (four) hours as needed for wheezing or shortness of breath. What changed: when to take this   albuterol (2.5 MG/3ML) 0.083% nebulizer solution Commonly known as: PROVENTIL Take 3 mLs (2.5 mg total) by nebulization every 6 (six) hours as needed for wheezing or shortness of breath. What changed: Another medication with the same name was changed. Make sure you understand how and when to take each.

## 2023-01-15 NOTE — Discharge Instructions (Addendum)
You were seen in the MAU for nausea and a few other symptoms.   We did an ultrasound that shows you are very early in pregnancy. I would like you to follow up in two days at the [facility] to get a repeat of your hormone level.  We also found that you have prediabetes. When you come in two days for your pregnancy hormone level check we will also do a pregnancy diabetes test. It is important that you come to that visit fasting, do not eat or drink anything after midnight except for water. I have also sent a prescription for a diabetes medicine called metformin, but do not start taking it until you hear back from me about the results of the sugar test.   I have also sent nausea medicine and a refill of your albuterol to your pharmacy.   Return to the MAU if you develop severe abdominal pain, nausea not responsive to phenergan and that prevents you from eating or drinking, or fever.

## 2023-01-17 ENCOUNTER — Ambulatory Visit (INDEPENDENT_AMBULATORY_CARE_PROVIDER_SITE_OTHER): Payer: Medicaid Other | Admitting: *Deleted

## 2023-01-17 ENCOUNTER — Encounter (HOSPITAL_COMMUNITY): Payer: Self-pay | Admitting: Obstetrics and Gynecology

## 2023-01-17 ENCOUNTER — Inpatient Hospital Stay (HOSPITAL_COMMUNITY)
Admission: AD | Admit: 2023-01-17 | Discharge: 2023-01-17 | Disposition: A | Discharge status: Home / Self Care | Payer: Medicaid Other | Attending: Obstetrics and Gynecology | Admitting: Obstetrics and Gynecology

## 2023-01-17 ENCOUNTER — Other Ambulatory Visit: Payer: Medicaid Other

## 2023-01-17 VITALS — BP 126/75 | HR 132 | Ht 62.0 in | Wt 235.0 lb

## 2023-01-17 DIAGNOSIS — F419 Anxiety disorder, unspecified: Secondary | ICD-10-CM | POA: Insufficient documentation

## 2023-01-17 DIAGNOSIS — Z79899 Other long term (current) drug therapy: Secondary | ICD-10-CM | POA: Diagnosis not present

## 2023-01-17 DIAGNOSIS — Z3A01 Less than 8 weeks gestation of pregnancy: Secondary | ICD-10-CM

## 2023-01-17 DIAGNOSIS — O161 Unspecified maternal hypertension, first trimester: Secondary | ICD-10-CM | POA: Insufficient documentation

## 2023-01-17 DIAGNOSIS — O99891 Other specified diseases and conditions complicating pregnancy: Secondary | ICD-10-CM | POA: Insufficient documentation

## 2023-01-17 DIAGNOSIS — E1165 Type 2 diabetes mellitus with hyperglycemia: Secondary | ICD-10-CM | POA: Insufficient documentation

## 2023-01-17 DIAGNOSIS — O99341 Other mental disorders complicating pregnancy, first trimester: Secondary | ICD-10-CM | POA: Diagnosis not present

## 2023-01-17 DIAGNOSIS — Z87891 Personal history of nicotine dependence: Secondary | ICD-10-CM | POA: Diagnosis not present

## 2023-01-17 DIAGNOSIS — F1320 Sedative, hypnotic or anxiolytic dependence, uncomplicated: Secondary | ICD-10-CM | POA: Insufficient documentation

## 2023-01-17 DIAGNOSIS — O3680X0 Pregnancy with inconclusive fetal viability, not applicable or unspecified: Secondary | ICD-10-CM

## 2023-01-17 DIAGNOSIS — O24111 Pre-existing diabetes mellitus, type 2, in pregnancy, first trimester: Secondary | ICD-10-CM | POA: Diagnosis not present

## 2023-01-17 DIAGNOSIS — Z833 Family history of diabetes mellitus: Secondary | ICD-10-CM | POA: Insufficient documentation

## 2023-01-17 DIAGNOSIS — E119 Type 2 diabetes mellitus without complications: Secondary | ICD-10-CM

## 2023-01-17 DIAGNOSIS — R7303 Prediabetes: Secondary | ICD-10-CM

## 2023-01-17 DIAGNOSIS — R42 Dizziness and giddiness: Secondary | ICD-10-CM

## 2023-01-17 DIAGNOSIS — R000 Tachycardia, unspecified: Secondary | ICD-10-CM | POA: Diagnosis not present

## 2023-01-17 DIAGNOSIS — I10 Essential (primary) hypertension: Secondary | ICD-10-CM

## 2023-01-17 DIAGNOSIS — Z8249 Family history of ischemic heart disease and other diseases of the circulatory system: Secondary | ICD-10-CM | POA: Insufficient documentation

## 2023-01-17 DIAGNOSIS — O219 Vomiting of pregnancy, unspecified: Secondary | ICD-10-CM

## 2023-01-17 LAB — COMPREHENSIVE METABOLIC PANEL
ALT: 24 U/L (ref 0–44)
AST: 19 U/L (ref 15–41)
Albumin: 3.2 g/dL — ABNORMAL LOW (ref 3.5–5.0)
Alkaline Phosphatase: 54 U/L (ref 38–126)
Anion gap: 10 (ref 5–15)
BUN: 5 mg/dL — ABNORMAL LOW (ref 6–20)
CO2: 17 mmol/L — ABNORMAL LOW (ref 22–32)
Calcium: 8.8 mg/dL — ABNORMAL LOW (ref 8.9–10.3)
Chloride: 106 mmol/L (ref 98–111)
Creatinine, Ser: 0.7 mg/dL (ref 0.44–1.00)
GFR, Estimated: >60 mL/min (ref >60)
Glucose, Bld: 234 mg/dL — ABNORMAL HIGH (ref 70–99)
Potassium: 4 mmol/L (ref 3.5–5.1)
Sodium: 133 mmol/L — ABNORMAL LOW (ref 135–145)
Total Bilirubin: 0.3 mg/dL (ref 0.3–1.2)
Total Protein: 6.7 g/dL (ref 6.5–8.1)

## 2023-01-17 LAB — CBC
HCT: 37 % (ref 36.0–46.0)
Hemoglobin: 12.1 g/dL (ref 12.0–15.0)
MCH: 30.2 pg (ref 26.0–34.0)
MCHC: 32.7 g/dL (ref 30.0–36.0)
MCV: 92.3 fL (ref 80.0–100.0)
Platelets: 344 10*3/uL (ref 150–400)
RBC: 4.01 MIL/uL (ref 3.87–5.11)
RDW: 14.6 % (ref 11.5–15.5)
WBC: 13.9 10*3/uL — ABNORMAL HIGH (ref 4.0–10.5)
nRBC: 0 % (ref 0.0–0.2)

## 2023-01-17 LAB — GLUCOSE, CAPILLARY
Glucose-Capillary: 281 mg/dL — ABNORMAL HIGH (ref 70–99)
Glucose-Capillary: 244 mg/dL — ABNORMAL HIGH (ref 70–99)

## 2023-01-17 LAB — BETA HCG QUANT (REF LAB): hCG Quant: 1033 m[IU]/mL

## 2023-01-17 MED ORDER — SODIUM CHLORIDE 0.9 % IV SOLN
8.0000 mg | Freq: Once | INTRAVENOUS | Status: AC
  Administered 2023-01-17: 8 mg via INTRAVENOUS
  Filled 2023-01-17: qty 4

## 2023-01-17 MED ORDER — ONDANSETRON HCL 8 MG PO TABS: 8.0000 mg | ORAL_TABLET | Freq: Three times a day (TID) | ORAL | 1 refills | Status: DC | PRN

## 2023-01-17 MED ORDER — LABETALOL HCL 100 MG PO TABS: 100.0000 mg | ORAL_TABLET | Freq: Two times a day (BID) | ORAL | 1 refills | Status: DC

## 2023-01-17 MED ORDER — SODIUM CHLORIDE 0.9 % IV SOLN
25.0000 mg | Freq: Once | INTRAVENOUS | Status: AC
  Administered 2023-01-17: 25 mg via INTRAVENOUS
  Filled 2023-01-17: qty 1

## 2023-01-17 MED ORDER — LACTATED RINGERS IV BOLUS
1000.0000 mL | Freq: Once | INTRAVENOUS | Status: AC
  Administered 2023-01-17: 1000 mL via INTRAVENOUS

## 2023-01-17 NOTE — MAU Note (Signed)
.  Carol Paul is a 27 y.o. at [redacted]w[redacted]d here in MAU reporting: Elevated Heart Rate and SOB after Drinking glucola drink at [facility] visit today. Patient indicated she has been told she may be a pre diabetic and has a personal meter that often shows fasting blood sugars greater than 200. Previous pregnancy noted to have periods of tachycardia.     Onset of complaint: today 01/17/23 Pain score: 0/10 Vitals:   01/17/23 1115  BP: (!) 148/87  Pulse: (!) 149  Resp: 20     Lab orders placed from triage:

## 2023-01-17 NOTE — MAU Provider Note (Addendum)
None      S: [name] is a 27 y.o. G2P0110 at [redacted]w[redacted]d  who presents to MAU today from [facility] office due to nausea, vomiting, dizziness, after doing the 1 hour glucola test. Pt states that she has a personal meter at home showing BGLs consistently over 200. Bedside fingerstick 244.  Pt was seen in MAU 01/15/23, A1C was 6.1. Pt was rx Metformin and advised to hold on taking it until her glucola came back.     O: BP 118/82   Pulse (!) 127   Resp 20   LMP 12/16/2022   SpO2 99%  GENERAL: Well-developed, well-nourished female. Anxious HEAD: Normocephalic, atraumatic.  CHEST: Normal effort of breathing, regular heart rate ABDOMEN: Soft, nontender   A: SIUP at [redacted]w[redacted]d  Elevated BGL   Everhart, Kirstie, DO 01/17/2023 2:07 PM

## 2023-01-17 NOTE — Progress Notes (Signed)
Here for stat bhcg. Denies pain. Denies bleeding. Explained we will draw stat bhcg and have her leave office. She will be called with results in about 2 hours after results received and reviewed by provider. Also informed her about [facility] services if needed . She voices understanding.   [name] 1210 Stat bhcg results received. Received message patient was sent to [facility] for evaluation for issues. Dr. Donavan Foil talked with Santiago Bur who is evaluating patient. He gave her stat bhcg results of 1033 and confirmed Misty Stanley will review with patient and she will order follow up. [name]

## 2023-01-17 NOTE — MAU Provider Note (Incomplete)
  History     CSN: [id number]  Arrival date and time: 01/17/23 1034   None     Chief Complaint  Patient presents with   Elevated heartrate   Hyperglycemia   Hypertension   HPI  {GYN/OB [id number]}  Past Medical History:  Diagnosis Date   Asthma     Past Surgical History:  Procedure Laterality Date   ANKLE SURGERY Left    TONSILLECTOMY      Family History  Problem Relation Age of Onset   CAD Other    Diabetes Mother     Social History   Tobacco Use   Smoking status: Former    Passive exposure: Current   Smokeless tobacco: Never  Vaping Use   Vaping status: Never Used  Substance Use Topics   Alcohol use: Not Currently    Comment: social   Drug use: Not Currently    Types: Marijuana    Comment: daily    Allergies:  Allergies  Allergen Reactions   Other Other (See Comments)    GI discomfort to wheat, soy, eggs, milk and sesame    Medications Prior to Admission  Medication Sig Dispense Refill Last Dose   albuterol (PROVENTIL) (2.5 MG/3ML) 0.083% nebulizer solution Take 3 mLs (2.5 mg total) by nebulization every 6 (six) hours as needed for wheezing or shortness of breath. 75 mL 1 Past Month   albuterol (VENTOLIN HFA) 108 (90 Base) MCG/ACT inhaler Inhale 1-2 puffs into the lungs every 4 (four) hours as needed for wheezing or shortness of breath. 54 g 5 01/16/2023   alprazolam (XANAX) 2 MG tablet Take 1 mg by mouth 3 (three) times daily.   01/16/2023   cloNIDine (CATAPRES) 0.1 MG tablet Take 0.2 mg by mouth at bedtime.   01/16/2023   Prenatal Vit-Fe Fumarate-FA (PRENATAL VITAMIN PO) Take 1 tablet by mouth daily at 6 (six) AM.   01/16/2023   promethazine (PHENERGAN) 25 MG suppository Place 1 suppository (25 mg total) rectally every 6 (six) hours as needed for nausea or vomiting. 12 each 5 01/17/2023   promethazine (PHENERGAN) 25 MG tablet Take 1 tablet (25 mg total) by mouth every 6 (six) hours as needed for nausea or vomiting. 30 tablet 5 01/17/2023   SYMBICORT  160-4.5 MCG/ACT inhaler Inhale 2 puffs into the lungs in the morning and at bedtime.   01/17/2023   amphetamine-dextroamphetamine (ADDERALL) 20 MG tablet Take 20 mg by mouth 2 (two) times daily. (Patient not taking: Reported on 01/17/2023)      metFORMIN (GLUCOPHAGE) 500 MG tablet Take 1 tablet (500 mg total) by mouth 2 (two) times daily with a meal. (Patient not taking: Reported on 01/17/2023) 60 tablet 5     Review of Systems Physical Exam   Blood pressure 118/82, pulse (!) 127, resp. rate 20, last menstrual period 12/16/2022, SpO2 99%, unknown if currently breastfeeding.  Physical Exam  MAU Course  Procedures  MDM ***  Assessment and Plan  ***  Venia Carbon 01/17/2023, 1:57 PM

## 2023-01-18 ENCOUNTER — Other Ambulatory Visit: Payer: Self-pay | Admitting: Obstetrics and Gynecology

## 2023-01-18 DIAGNOSIS — R000 Tachycardia, unspecified: Secondary | ICD-10-CM

## 2023-01-18 DIAGNOSIS — O3680X0 Pregnancy with inconclusive fetal viability, not applicable or unspecified: Secondary | ICD-10-CM

## 2023-01-18 DIAGNOSIS — I10 Essential (primary) hypertension: Secondary | ICD-10-CM

## 2023-01-18 DIAGNOSIS — F1320 Sedative, hypnotic or anxiolytic dependence, uncomplicated: Secondary | ICD-10-CM | POA: Insufficient documentation

## 2023-01-20 NOTE — Progress Notes (Signed)
Patient was assessed and managed by nursing staff during this encounter. I have reviewed the chart and agree with the documentation and plan. I have also made any necessary editorial changes.  Warden Fillers, MD 01/20/2023 9:32 AM

## 2023-01-26 ENCOUNTER — Encounter (HOSPITAL_COMMUNITY): Payer: Self-pay | Admitting: Obstetrics and Gynecology

## 2023-01-26 ENCOUNTER — Inpatient Hospital Stay (HOSPITAL_COMMUNITY): Payer: Medicaid Other

## 2023-01-26 ENCOUNTER — Inpatient Hospital Stay (HOSPITAL_COMMUNITY)
Admission: AD | Admit: 2023-01-26 | Discharge: 2023-01-27 | Disposition: A | Discharge status: Home / Self Care | Payer: Medicaid Other | Source: Home / Self Care | Attending: Obstetrics and Gynecology | Admitting: Obstetrics and Gynecology

## 2023-01-26 DIAGNOSIS — O3481 Maternal care for other abnormalities of pelvic organs, first trimester: Secondary | ICD-10-CM | POA: Insufficient documentation

## 2023-01-26 DIAGNOSIS — O26891 Other specified pregnancy related conditions, first trimester: Secondary | ICD-10-CM | POA: Diagnosis present

## 2023-01-26 DIAGNOSIS — O24111 Pre-existing diabetes mellitus, type 2, in pregnancy, first trimester: Secondary | ICD-10-CM | POA: Insufficient documentation

## 2023-01-26 DIAGNOSIS — N8311 Corpus luteum cyst of right ovary: Secondary | ICD-10-CM | POA: Insufficient documentation

## 2023-01-26 DIAGNOSIS — R1031 Right lower quadrant pain: Secondary | ICD-10-CM | POA: Diagnosis present

## 2023-01-26 DIAGNOSIS — Z7984 Long term (current) use of oral hypoglycemic drugs: Secondary | ICD-10-CM | POA: Insufficient documentation

## 2023-01-26 DIAGNOSIS — Z79899 Other long term (current) drug therapy: Secondary | ICD-10-CM | POA: Insufficient documentation

## 2023-01-26 DIAGNOSIS — O10911 Unspecified pre-existing hypertension complicating pregnancy, first trimester: Secondary | ICD-10-CM | POA: Diagnosis not present

## 2023-01-26 DIAGNOSIS — O418X10 Other specified disorders of amniotic fluid and membranes, first trimester, not applicable or unspecified: Secondary | ICD-10-CM

## 2023-01-26 DIAGNOSIS — O09291 Supervision of pregnancy with other poor reproductive or obstetric history, first trimester: Secondary | ICD-10-CM | POA: Insufficient documentation

## 2023-01-26 DIAGNOSIS — O208 Other hemorrhage in early pregnancy: Secondary | ICD-10-CM | POA: Diagnosis present

## 2023-01-26 DIAGNOSIS — E119 Type 2 diabetes mellitus without complications: Secondary | ICD-10-CM | POA: Diagnosis not present

## 2023-01-26 DIAGNOSIS — Z3A01 Less than 8 weeks gestation of pregnancy: Secondary | ICD-10-CM | POA: Diagnosis not present

## 2023-01-26 DIAGNOSIS — O368310 Maternal care for abnormalities of the fetal heart rate or rhythm, first trimester, not applicable or unspecified: Secondary | ICD-10-CM | POA: Diagnosis not present

## 2023-01-26 DIAGNOSIS — O26851 Spotting complicating pregnancy, first trimester: Secondary | ICD-10-CM

## 2023-01-26 LAB — URINALYSIS, ROUTINE W REFLEX MICROSCOPIC
Bilirubin Urine: NEGATIVE
Glucose, UA: NEGATIVE mg/dL
Hgb urine dipstick: NEGATIVE
Ketones, ur: NEGATIVE mg/dL
Leukocytes,Ua: NEGATIVE
Nitrite: POSITIVE — AB
Protein, ur: 30 mg/dL — AB
Specific Gravity, Urine: 1.026 (ref 1.005–1.030)
pH: 5 (ref 5.0–8.0)

## 2023-01-26 NOTE — MAU Note (Addendum)
..  Carol Paul is a 27 y.o. at [redacted]w[redacted]d here in MAU reporting: vaginal bleeding for the past 2 days, was on her underwear this morning and now it is only some times when she wipes. Last intercourse: couple days ago Right sided abdominal pain, has been having it since 4 weeks, but it has been more painful for the past few days. Has burning when she voids and urgency. Tool Azo around 2 pm  Pain score: /10 Vitals:   01/26/23 2128  BP: (!) 124/92  Pulse: (!) 140  Resp: 20  Temp: 99 F (37.2 C)  SpO2: 99%     FHT: n/a Lab orders placed from triage: UA

## 2023-01-27 ENCOUNTER — Inpatient Hospital Stay (HOSPITAL_COMMUNITY): Payer: Medicaid Other

## 2023-01-27 ENCOUNTER — Encounter (HOSPITAL_COMMUNITY): Payer: Self-pay | Admitting: Obstetrics and Gynecology

## 2023-01-27 ENCOUNTER — Inpatient Hospital Stay (EMERGENCY_DEPARTMENT_HOSPITAL)
Admission: AD | Admit: 2023-01-27 | Discharge: 2023-01-27 | Disposition: A | Discharge status: Home / Self Care | Payer: Medicaid Other | Source: Home / Self Care | Attending: Obstetrics & Gynecology | Admitting: Obstetrics & Gynecology

## 2023-01-27 ENCOUNTER — Encounter (HOSPITAL_COMMUNITY): Payer: Self-pay | Admitting: Obstetrics & Gynecology

## 2023-01-27 DIAGNOSIS — O3481 Maternal care for other abnormalities of pelvic organs, first trimester: Secondary | ICD-10-CM | POA: Insufficient documentation

## 2023-01-27 DIAGNOSIS — O10011 Pre-existing essential hypertension complicating pregnancy, first trimester: Secondary | ICD-10-CM | POA: Insufficient documentation

## 2023-01-27 DIAGNOSIS — Z3A01 Less than 8 weeks gestation of pregnancy: Secondary | ICD-10-CM

## 2023-01-27 DIAGNOSIS — O26851 Spotting complicating pregnancy, first trimester: Secondary | ICD-10-CM | POA: Diagnosis not present

## 2023-01-27 DIAGNOSIS — N8311 Corpus luteum cyst of right ovary: Secondary | ICD-10-CM | POA: Insufficient documentation

## 2023-01-27 DIAGNOSIS — O24111 Pre-existing diabetes mellitus, type 2, in pregnancy, first trimester: Secondary | ICD-10-CM | POA: Insufficient documentation

## 2023-01-27 DIAGNOSIS — E119 Type 2 diabetes mellitus without complications: Secondary | ICD-10-CM

## 2023-01-27 DIAGNOSIS — O208 Other hemorrhage in early pregnancy: Secondary | ICD-10-CM | POA: Insufficient documentation

## 2023-01-27 DIAGNOSIS — Z79899 Other long term (current) drug therapy: Secondary | ICD-10-CM | POA: Insufficient documentation

## 2023-01-27 DIAGNOSIS — O418X10 Other specified disorders of amniotic fluid and membranes, first trimester, not applicable or unspecified: Secondary | ICD-10-CM

## 2023-01-27 DIAGNOSIS — I10 Essential (primary) hypertension: Secondary | ICD-10-CM

## 2023-01-27 HISTORY — DX: Urinary tract infection, site not specified: N39.0

## 2023-01-27 HISTORY — DX: Thyrotoxicosis, unspecified without thyrotoxic crisis or storm: E05.90

## 2023-01-27 HISTORY — DX: Essential (primary) hypertension: I10

## 2023-01-27 HISTORY — DX: Type 2 diabetes mellitus without complications: E11.9

## 2023-01-27 HISTORY — DX: Anxiety disorder, unspecified: F41.9

## 2023-01-27 HISTORY — DX: Unspecified ovarian cyst, unspecified side: N83.209

## 2023-01-27 LAB — CULTURE, OB URINE

## 2023-01-27 MED ORDER — ACETAMINOPHEN 500 MG PO TABS
1000.0000 mg | ORAL_TABLET | Freq: Once | ORAL | Status: AC
  Administered 2023-01-27: 1000 mg via ORAL
  Filled 2023-01-27: qty 2

## 2023-01-27 MED ORDER — CEFADROXIL 500 MG PO CAPS
500.0000 mg | ORAL_CAPSULE | Freq: Once | ORAL | Status: AC
  Administered 2023-01-27: 500 mg via ORAL
  Filled 2023-01-27: qty 1

## 2023-01-27 MED ORDER — CEFADROXIL 500 MG PO CAPS: 500.0000 mg | ORAL_CAPSULE | Freq: Two times a day (BID) | ORAL | 0 refills | Status: DC

## 2023-01-27 NOTE — MAU Provider Note (Signed)
History     CSN: [id number]  Arrival date and time: 01/27/23 1525   None     Chief Complaint  Patient presents with   Abdominal Pain   Back Pain   Vaginal Discharge   HPI This is a 27 year old G2 P0-1-1-0 at 6 weeks and 0 days who came because of a wet spot on her underwear when she woke up this afternoon.  She was here late last night/early morning and went to bed when she got home.  She has been taking Azo which has been covering her urine.  The spot on her underwear was clear.  She has a little bit of cramping.  She was diagnosed with a subchorionic hematoma on her last [place]. She did have confirmed IUP. OB History     Gravida  2   Para  1   Term  0   Preterm  1   AB  1   Living  0      SAB  0   IAB  0   Ectopic  0   Multiple  1   Live Births  1           Past Medical History:  Diagnosis Date   Anxiety    Asthma    Diabetes mellitus without complication (HCC)    Hypertension    Hyperthyroidism    Ovarian cyst    UTI (urinary tract infection)     Past Surgical History:  Procedure Laterality Date   ANKLE SURGERY Left    TONSILLECTOMY      Family History  Problem Relation Age of Onset   Healthy Mother    Hypothyroidism Father    CAD Other     Social History   Tobacco Use   Smoking status: Former    Passive exposure: Current   Smokeless tobacco: Never  Vaping Use   Vaping status: Never Used  Substance Use Topics   Alcohol use: Not Currently    Comment: social   Drug use: Not Currently    Types: Marijuana    Comment: daily    Allergies:  Allergies  Allergen Reactions   Other Other (See Comments)    GI discomfort to wheat, soy, eggs, milk and sesame    Medications Prior to Admission  Medication Sig Dispense Refill Last Dose   albuterol (PROVENTIL) (2.5 MG/3ML) 0.083% nebulizer solution Take 3 mLs (2.5 mg total) by nebulization every 6 (six) hours as needed for wheezing or shortness of breath. 75 mL 1 01/26/2023   albuterol  (VENTOLIN HFA) 108 (90 Base) MCG/ACT inhaler Inhale 1-2 puffs into the lungs every 4 (four) hours as needed for wheezing or shortness of breath. 54 g 5 01/26/2023   labetalol (NORMODYNE) 100 MG tablet Take 1 tablet (100 mg total) by mouth 2 (two) times daily. 90 tablet 1 01/27/2023   levothyroxine (SYNTHROID) 25 MCG tablet Take 25 mcg by mouth daily before breakfast.   01/27/2023   metFORMIN (GLUCOPHAGE) 500 MG tablet Take 500 mg by mouth 2 (two) times daily with a meal.   01/27/2023   Prenatal Vit-Fe Fumarate-FA (PRENATAL VITAMIN PO) Take 1 tablet by mouth daily at 6 (six) AM.   01/27/2023   promethazine (PHENERGAN) 25 MG suppository Place 1 suppository (25 mg total) rectally every 6 (six) hours as needed for nausea or vomiting. 12 each 5 Past Month   promethazine (PHENERGAN) 25 MG tablet Take 1 tablet (25 mg total) by mouth every 6 (six) hours as needed for  nausea or vomiting. 30 tablet 5 01/26/2023   SYMBICORT 160-4.5 MCG/ACT inhaler Inhale 2 puffs into the lungs in the morning and at bedtime.   01/27/2023   cefadroxil (DURICEF) 500 MG capsule Take 1 capsule (500 mg total) by mouth 2 (two) times daily. 20 capsule 0    ondansetron (ZOFRAN) 8 MG tablet Take 1 tablet (8 mg total) by mouth every 8 (eight) hours as needed for nausea or vomiting. 20 tablet 1     Review of Systems Physical Exam   Blood pressure (!) 142/102, pulse (!) 140, temperature 98.6 F (37 C), temperature source Oral, resp. rate (!) 22, height 5\' 2"  (1.575 m), weight 106.4 kg, last menstrual period 12/16/2022, SpO2 97%, unknown if currently breastfeeding.  Physical Exam Vitals reviewed.  Constitutional:      Appearance: She is well-developed.  HENT:     Head: Normocephalic and atraumatic.  Abdominal:     Tenderness: There is abdominal tenderness (cramping) in the suprapubic area. There is no guarding or rebound.  Skin:    General: Skin is warm and dry.  Neurological:     Mental Status: She is alert.     MAU Course   Procedures  MDM Will get [place] to evaluate pregnancy.  Is on antibiotics and Azo for UTI  Imaging: I independent reviewed the images of the Ultrasound.  Small subchorionic hematoma.  Live intrauterine pregnancy with cardiac activity of 160.   Assessment and Plan   1. [redacted] weeks gestation of pregnancy   2. Subchorionic hematoma in first trimester, single or unspecified fetus   3. Chronic hypertension   4. Type 2 diabetes mellitus without complication, without long-term current use of insulin (HCC)    Discharge to home. Will establish OB care. Continue labetalol  Carol Paul 01/27/2023, 4:40 PM

## 2023-01-27 NOTE — Discharge Instructions (Signed)
Return to MAU: If you have heavier bleeding that soaks through more that 2 pads per hour for an hour or more If you bleed so much that you feel like you might pass out or you do pass out If you have significant abdominal pain that is not improved with Tylenol 1000 mg every 8 hours as needed for pain If you develop a fever > 100.5  

## 2023-01-27 NOTE — Progress Notes (Signed)
Charting from office encounter on 01/17/23, around 11AM.   Patient had lab and nurse visit today for stat beta hcg and 2 hour glucose (early glucose test). Approximately 1 hour after patient drank glucola (per patient), she began to feel faint, dizzy, and not well. Patient stated that she checked her CBG in her car, and that it was around 316. Patient went to front desk and explained how she was feeling. Front desk staff came to pod and saw that I was available and asked that I take care of patient. Consulted with Dr. Donavan Foil, the provider I was working with that day. Per provider, went to lobby/waiting area and checked patient's CBG on our monitor. Patient's CBG 281. Patient declined pain and stated that she was not anxious. Blood pressure and RR WNL, but pulse in 140s.   Reviewed CBG and heart-rate with provider. Per provider, patient needed to go to MAU and cancel remainder of in-office 2 hour GTT.   Instructed patient on provider's recommendation. Patient (and her partner) verbalized understanding and did not have any other questions or concerns.   Maureen Ralphs RN on 01/27/23 at [phone]

## 2023-01-27 NOTE — MAU Note (Signed)
Carol Paul is a 27 y.o. at [redacted]w[redacted]d here in MAU reporting: last night was here because  of spotting and cramping.  Was told she had a subchorionic hemorrhage.  At 1230, she woke up- with gush of clear fluid in her underwear. Knows it is not urine, because she is on AZO and it was clear.  Is experiencing cramping really bad in the front and the back. No bleeding today.  Onset of complaint: 1230 Pain score: abd 6, back 7 Vitals:   01/27/23 1612  BP: (!) 142/102  Pulse: (!) 140  Resp: (!) 22  Temp: 98.6 F (37 C)  SpO2: 97%     Lab orders placed from triage:

## 2023-01-27 NOTE — MAU Provider Note (Signed)
History     CSN: [id number]  Arrival date and time: 01/26/23 2115   Event Date/Time   First Provider Initiated Contact with Patient 01/27/23 0132      Chief Complaint  Patient presents with   Abdominal Pain   Vaginal Bleeding   HPI [name] is a 27 y.o. year old G74P0110 female at [redacted]w[redacted]d weeks gestation by LMP who presents to MAU reporting vaginal bleeding for the past 2 days. She reports that the VB was on her underwear this morning and now it is only some times when she wipes. She also complains of RT side abdominal pain x 4 weeks, but more painful the past few days. She also reports she has had burning with urination and urgency. She has took Azo around 1400 today. She reports last SI was "a couple of days ago." Her high risk pregnancy is complicated by cHTN, Type 2 Diabetes, tachycardia, significant mental health history, and loss of 17.[redacted] wks gestation twins in February of this year (one was IUFD and the other was a neonatal death). She receives [facility] with [facility] provider; next appt is in 2 weeks for OB appt and U/S. Her partner is present and contributing to the history taking.    OB History     Gravida  2   Para  1   Term  0   Preterm  1   AB  1   Living  0      SAB  0   IAB  0   Ectopic  0   Multiple  1   Live Births  1           Past Medical History:  Diagnosis Date   Asthma     Past Surgical History:  Procedure Laterality Date   ANKLE SURGERY Left    TONSILLECTOMY      Family History  Problem Relation Age of Onset   CAD Other    Diabetes Mother     Social History   Tobacco Use   Smoking status: Former    Passive exposure: Current   Smokeless tobacco: Never  Vaping Use   Vaping status: Never Used  Substance Use Topics   Alcohol use: Not Currently    Comment: social   Drug use: Not Currently    Types: Marijuana    Comment: daily    Allergies:  Allergies  Allergen Reactions   Other Other (See Comments)    GI  discomfort to wheat, soy, eggs, milk and sesame    Medications Prior to Admission  Medication Sig Dispense Refill Last Dose   albuterol (VENTOLIN HFA) 108 (90 Base) MCG/ACT inhaler Inhale 1-2 puffs into the lungs every 4 (four) hours as needed for wheezing or shortness of breath. 54 g 5 01/26/2023   labetalol (NORMODYNE) 100 MG tablet Take 1 tablet (100 mg total) by mouth 2 (two) times daily. 90 tablet 1 01/26/2023   levothyroxine (SYNTHROID) 25 MCG tablet Take 25 mcg by mouth daily before breakfast.   01/26/2023   metFORMIN (GLUCOPHAGE) 500 MG tablet Take 500 mg by mouth 2 (two) times daily with a meal.   01/26/2023   Prenatal Vit-Fe Fumarate-FA (PRENATAL VITAMIN PO) Take 1 tablet by mouth daily at 6 (six) AM.   01/26/2023   promethazine (PHENERGAN) 25 MG tablet Take 1 tablet (25 mg total) by mouth every 6 (six) hours as needed for nausea or vomiting. 30 tablet 5 01/26/2023   SYMBICORT 160-4.5 MCG/ACT inhaler Inhale  2 puffs into the lungs in the morning and at bedtime.   01/26/2023   albuterol (PROVENTIL) (2.5 MG/3ML) 0.083% nebulizer solution Take 3 mLs (2.5 mg total) by nebulization every 6 (six) hours as needed for wheezing or shortness of breath. 75 mL 1    ondansetron (ZOFRAN) 8 MG tablet Take 1 tablet (8 mg total) by mouth every 8 (eight) hours as needed for nausea or vomiting. 20 tablet 1    promethazine (PHENERGAN) 25 MG suppository Place 1 suppository (25 mg total) rectally every 6 (six) hours as needed for nausea or vomiting. 12 each 5     Review of Systems  Constitutional: Negative.   HENT: Negative.    Eyes: Negative.   Respiratory: Negative.    Cardiovascular: Negative.   Gastrointestinal: Negative.   Endocrine: Negative.   Genitourinary:  Positive for dysuria, pelvic pain, urgency and vaginal bleeding (spotting with wiping).  Musculoskeletal: Negative.   Skin: Negative.   Allergic/Immunologic: Negative.   Neurological: Negative.   Hematological: Negative.    Psychiatric/Behavioral: Negative.     Physical Exam   Blood pressure (!) 124/92, pulse (!) 140, temperature 99 F (37.2 C), temperature source Oral, resp. rate 20, height 5\' 2"  (1.575 m), weight 108.1 kg, last menstrual period 12/16/2022, SpO2 99%, unknown if currently breastfeeding.  Physical Exam Vitals and nursing note reviewed.  Constitutional:      Appearance: Normal appearance. She is obese.  Cardiovascular:     Rate and Rhythm: Tachycardia present.  Pulmonary:     Effort: Pulmonary effort is normal.  Genitourinary:    Comments: deferred Musculoskeletal:        General: Normal range of motion.  Skin:    General: Skin is warm and dry.  Neurological:     Mental Status: She is alert and oriented to person, place, and time.  Psychiatric:        Mood and Affect: Mood normal.        Behavior: Behavior normal.        Thought Content: Thought content normal.        Judgment: Judgment normal.    MAU Course  Procedures  MDM  US OB LESS THAN 14 WEEKS WITH OB TRANSVAGINAL  Result Date: 01/26/2023 CLINICAL DATA:  Right lower quadrant pain EXAM: OBSTETRIC <14 WK [place] AND TRANSVAGINAL OB US TECHNIQUE: Both transabdominal and transvaginal ultrasound examinations were performed for complete evaluation of the gestation as well as the maternal uterus, adnexal regions, and pelvic cul-de-sac. Transvaginal technique was performed to assess early pregnancy. COMPARISON:  01/15/2023 FINDINGS: Intrauterine gestational sac: Single Yolk sac:  Visualized. Embryo:  Visualized. Cardiac Activity: Visualized. Heart Rate: 99 bpm MSD:   mm    w     d CRL:  2.3 mm   5 w   5 d                  [place] EDC: 09/23/2023 Subchorionic hemorrhage:  Small subchorionic hemorrhage. Maternal uterus/adnexae: No adnexal mass or free fluid. IMPRESSION: Five week 5 day intrauterine pregnancy. Fetal bradycardia with a fetal heart rate of 99 beats per minute which may be related to early gestational age. Small subchorionic  hemorrhage. Electronically Signed   By: Charlett Nose M.D.   On: 01/26/2023 23:30    Assessment and Plan  1. Spotting affecting pregnancy in first trimester - Information provided on vaginal bleeding in pregnancy - Return to MAU: If you have heavier bleeding that soaks through more that 2 pads per hour  for an hour or more If you bleed so much that you feel like you might pass out or you do pass out If you have significant abdominal pain that is not improved with Tylenol 1000 mg every 8 hours as needed for pain If you develop a fever > 100.5   2. Subchorionic hematoma in first trimester, single or unspecified fetus - Information provided on [facility] and threatened miscarriage   3. [redacted] weeks gestation of pregnancy   - Discharge patient - Keep scheduled appt with [facility] provider - Patient verbalized an understanding of the plan of care and agrees.    Raelyn Mora, CNM 01/27/2023, 1:32 AM

## 2023-01-28 LAB — CULTURE, OB URINE

## 2023-01-29 ENCOUNTER — Other Ambulatory Visit: Payer: Medicaid Other

## 2023-01-29 ENCOUNTER — Encounter: Payer: Self-pay | Admitting: General Practice

## 2023-02-03 ENCOUNTER — Other Ambulatory Visit: Payer: Self-pay | Admitting: General Practice

## 2023-02-03 DIAGNOSIS — O09299 Supervision of pregnancy with other poor reproductive or obstetric history, unspecified trimester: Secondary | ICD-10-CM

## 2023-02-03 DIAGNOSIS — O208 Other hemorrhage in early pregnancy: Secondary | ICD-10-CM

## 2023-02-10 ENCOUNTER — Ambulatory Visit (INDEPENDENT_AMBULATORY_CARE_PROVIDER_SITE_OTHER): Payer: Medicaid Other

## 2023-02-10 DIAGNOSIS — O09291 Supervision of pregnancy with other poor reproductive or obstetric history, first trimester: Secondary | ICD-10-CM

## 2023-02-10 DIAGNOSIS — Z3A08 8 weeks gestation of pregnancy: Secondary | ICD-10-CM | POA: Diagnosis not present

## 2023-02-10 DIAGNOSIS — O208 Other hemorrhage in early pregnancy: Secondary | ICD-10-CM

## 2023-02-10 DIAGNOSIS — O09299 Supervision of pregnancy with other poor reproductive or obstetric history, unspecified trimester: Secondary | ICD-10-CM

## 2023-02-20 ENCOUNTER — Telehealth: Payer: Medicaid Other

## 2023-02-20 ENCOUNTER — Telehealth: Payer: Self-pay | Admitting: *Deleted

## 2023-02-20 NOTE — Telephone Encounter (Signed)
Carol Paul was scheduled for a new ob intake at 11:15. At 11:18 she had not connected virtually. I called and left a message I was trying to get in contact with her for her virtual visit and to please  connect virtually so we can complete visit, if issues to call office. [name] At 11:36 patient still had not connected virtually. I called again and left a a message I was calling back about her virtual visit and since I could not connect with her , she will need to call and reschedule. [name]

## 2023-03-03 ENCOUNTER — Encounter: Payer: Medicaid Other | Admitting: Obstetrics and Gynecology

## 2023-03-04 ENCOUNTER — Ambulatory Visit: Payer: Medicaid Other | Admitting: Cardiology

## 2023-03-05 ENCOUNTER — Telehealth: Payer: Self-pay | Admitting: *Deleted

## 2023-03-05 DIAGNOSIS — O24311 Unspecified pre-existing diabetes mellitus in pregnancy, first trimester: Secondary | ICD-10-CM

## 2023-03-05 NOTE — Telephone Encounter (Signed)
Patient called and states she is wanting to get started on insulin. States she was told she has to wait until her new ob visit. She admits she did not keep her new ob visit because she was feeling bad. She denies being told she has type 2 diabetes but was sent in a meter from her one ob visit to [facility]. States she is not going back to [facility] and plans to receive care with [place].  She states she feels her hemoglobin A1c that was done at her MAU visit indicates she needs insulin. She also started a 2 hour gtt 01/17/23 when she was here for stat bhcg visit but did not finish and was sent to the hospital because of c/o dizziness/nausea/ vomiting/ tachycardia. I verified with [facility] no labs results from 2 hour GTT on 7/12/243. Of note she missed her new ob intake scheduled 02/20/23 and her new ob scheduled 03/03/23 . She also canceled and rescheduled her cardio- ob appointment scheduled 03/04/23.I explained we do not usually start insulin without seeing patient in office first and that she will need to see diabetes educator for new patient information. She confirms she has never seen a diabetes educator. I explained I will need to talk with provider and call her back. I reviewed chart with Dr. Alvester Morin and patient request. She states from review of chart / labs patient is diabetic. She also confirmed she will need to be seen for her new  ob visit 03/24/23 by a provider and since she is only 11 weeks now that will be appropriate.  I called [place] and informed her that we cannot start insulin until she has been seen by a provider at her new ob visit. I explained it is very important she keeps her appointments even if she feels very sick and that we want to help her feel better. I asked how she knows how to do cbg's and she says she has family members who are diabetic. I informed her I had scheduled a diabetes education appointment for 03/11/23. She requested it be cancelled because she states she has looked up diet,  etc and doesn't need appointment.  [name]

## 2023-03-11 ENCOUNTER — Ambulatory Visit: Payer: Medicaid Other | Admitting: Skilled Nursing Facility1

## 2023-03-20 ENCOUNTER — Inpatient Hospital Stay (HOSPITAL_COMMUNITY)
Admission: AD | Admit: 2023-03-20 | Discharge: 2023-03-20 | Disposition: A | Discharge status: Home / Self Care | Payer: Medicaid Other | Attending: Obstetrics and Gynecology | Admitting: Obstetrics and Gynecology

## 2023-03-20 DIAGNOSIS — Z3A13 13 weeks gestation of pregnancy: Secondary | ICD-10-CM | POA: Insufficient documentation

## 2023-03-20 DIAGNOSIS — Z1152 Encounter for screening for COVID-19: Secondary | ICD-10-CM | POA: Diagnosis not present

## 2023-03-20 DIAGNOSIS — O98511 Other viral diseases complicating pregnancy, first trimester: Secondary | ICD-10-CM | POA: Diagnosis not present

## 2023-03-20 DIAGNOSIS — A084 Viral intestinal infection, unspecified: Secondary | ICD-10-CM | POA: Diagnosis not present

## 2023-03-20 DIAGNOSIS — O219 Vomiting of pregnancy, unspecified: Secondary | ICD-10-CM | POA: Diagnosis present

## 2023-03-20 LAB — COMPREHENSIVE METABOLIC PANEL
ALT: 33 U/L (ref 0–44)
AST: 48 U/L — ABNORMAL HIGH (ref 15–41)
Albumin: 3.1 g/dL — ABNORMAL LOW (ref 3.5–5.0)
Alkaline Phosphatase: 46 U/L (ref 38–126)
Anion gap: 13 (ref 5–15)
BUN: 5 mg/dL — ABNORMAL LOW (ref 6–20)
CO2: 19 mmol/L — ABNORMAL LOW (ref 22–32)
Calcium: 9.1 mg/dL (ref 8.9–10.3)
Chloride: 103 mmol/L (ref 98–111)
Creatinine, Ser: 0.62 mg/dL (ref 0.44–1.00)
GFR, Estimated: >60 mL/min (ref >60)
Glucose, Bld: 141 mg/dL — ABNORMAL HIGH (ref 70–99)
Potassium: 3.6 mmol/L (ref 3.5–5.1)
Sodium: 135 mmol/L (ref 135–145)
Total Bilirubin: 0.3 mg/dL (ref 0.3–1.2)
Total Protein: 6.6 g/dL (ref 6.5–8.1)

## 2023-03-20 LAB — CBC
HCT: 38.1 % (ref 36.0–46.0)
Hemoglobin: 12.9 g/dL (ref 12.0–15.0)
MCH: 31.2 pg (ref 26.0–34.0)
MCHC: 33.9 g/dL (ref 30.0–36.0)
MCV: 92.3 fL (ref 80.0–100.0)
Platelets: 320 10*3/uL (ref 150–400)
RBC: 4.13 MIL/uL (ref 3.87–5.11)
RDW: 12.6 % (ref 11.5–15.5)
WBC: 15 10*3/uL — ABNORMAL HIGH (ref 4.0–10.5)
nRBC: 0 % (ref 0.0–0.2)

## 2023-03-20 LAB — SARS CORONAVIRUS 2 BY RT PCR: SARS Coronavirus 2 by RT PCR: NEGATIVE

## 2023-03-20 MED ORDER — FAMOTIDINE IN NACL 20-0.9 MG/50ML-% IV SOLN
20.0000 mg | Freq: Once | INTRAVENOUS | Status: AC
  Administered 2023-03-20: 20 mg via INTRAVENOUS
  Filled 2023-03-20: qty 50

## 2023-03-20 MED ORDER — TRANSDERM-SCOP 1 MG/3DAYS TD PT72: 1.0000 | MEDICATED_PATCH | TRANSDERMAL | 1 refills | Status: DC

## 2023-03-20 MED ORDER — LACTATED RINGERS IV SOLN: Freq: Once | INTRAVENOUS | Status: AC

## 2023-03-20 MED ORDER — SCOPOLAMINE 1 MG/3DAYS TD PT72: 1.0000 | MEDICATED_PATCH | TRANSDERMAL | 1 refills | Status: DC

## 2023-03-20 MED ORDER — ONDANSETRON HCL 4 MG/2ML IJ SOLN
4.0000 mg | Freq: Once | INTRAMUSCULAR | Status: AC
  Administered 2023-03-20: 4 mg via INTRAVENOUS
  Filled 2023-03-20: qty 2

## 2023-03-20 MED ORDER — SCOPOLAMINE 1 MG/3DAYS TD PT72
1.0000 | MEDICATED_PATCH | Freq: Once | TRANSDERMAL | Status: DC
  Administered 2023-03-20: 1.5 mg via TRANSDERMAL
  Filled 2023-03-20: qty 1

## 2023-03-20 MED ORDER — SODIUM CHLORIDE 0.9 % IV SOLN
12.5000 mg | Freq: Once | INTRAVENOUS | Status: AC
  Administered 2023-03-20: 12.5 mg via INTRAVENOUS
  Filled 2023-03-20: qty 12.5

## 2023-03-20 MED ORDER — ONDANSETRON 4 MG PO TBDP: 4.0000 mg | ORAL_TABLET | Freq: Three times a day (TID) | ORAL | 0 refills | Status: DC | PRN

## 2023-03-20 NOTE — MAU Note (Addendum)
.  Carol Paul is a 27 y.o. at [redacted]w[redacted]d here in MAU reporting:  Past three days has not been able to keep anything down. Pt has vomited 10 times in the past 24 hours and two hours ago it has been non stop. Pt does have zofran prescribed  but it has not helped.  Pt does have a UTI but has not been able to take prescription due to vomiting.   No bleeding, or lof.  6/10 pain score on lower abdomen due to vomiting  Vitals:   03/20/23 0621  BP: 122/69  Pulse: (!) 124  Resp: 19  Temp: 98.7 F (37.1 C)  SpO2: 92%

## 2023-03-20 NOTE — MAU Provider Note (Addendum)
Chief Complaint: Emesis   Event Date/Time   First Provider Initiated Contact with Patient 03/20/23 [phone]        SUBJECTIVE HPI: Carol Paul is a 27 y.o. G2P0110 at [redacted]w[redacted]d by LMP who presents to maternity admissions reporting nausea and vomiting for 3 days. Had this early in pregnancy but it had improved over the last few weeks.  The current episode started abruptly 3 days ago. Has some nasal congestion and chills also. She denies vaginal bleeding, urinary symptoms, h/a, n/v, or fever.    Emesis  This is a new problem. The current episode started in the past 7 days. The problem occurs 5 to 10 times per day. The problem has been unchanged. There has been no fever. Associated symptoms include chills. Pertinent negatives include no coughing, diarrhea, fever or myalgias. Treatments tried: zofran. The treatment provided no relief.   RN Note [name] is a 27 y.o. at [redacted]w[redacted]d here in MAU reporting:  Past three days has not been able to keep anything down. Pt has vomited 10 times in the past 24 hours and two hours ago it has been non stop. Pt does have zofran prescribed  but it has not helped.  Pt does have a UTI but has not been able to take prescription due to vomiting.  Past Medical History:  Diagnosis Date   Anxiety    Asthma    Diabetes mellitus without complication (HCC)    Hypertension    Hyperthyroidism    Ovarian cyst    UTI (urinary tract infection)    Past Surgical History:  Procedure Laterality Date   ANKLE SURGERY Left    TONSILLECTOMY     Social History   Socioeconomic History   Marital status: Married    Spouse name: Carol Paul   Number of children: Not on file   Years of education: Not on file   Highest education level: Not on file  Occupational History   Not on file  Tobacco Use   Smoking status: Former    Passive exposure: Current   Smokeless tobacco: Never  Vaping Use   Vaping status: Never Used  Substance and Sexual Activity   Alcohol use: Not  Currently    Comment: social   Drug use: Not Currently    Types: Marijuana    Comment: daily   Sexual activity: Yes    Birth control/protection: None  Other Topics Concern   Not on file  Social History Narrative   Not on file   Social Determinants of Health   Financial Resource Strain: Low Risk  (01/23/2023)   Received from [facility]   Overall Financial Resource Strain (CARDIA)    Difficulty of Paying Living Expenses: Not very hard  Food Insecurity: No Food Insecurity (01/23/2023)   Received from [facility]   Hunger Vital Sign    Worried About Running Out of Food in the Last Year: Never true    Ran Out of Food in the Last Year: Never true  Transportation Needs: No Transportation Needs (01/23/2023)   Received from [facility] - Transportation    Lack of Transportation (Medical): No    Lack of Transportation (Non-Medical): No  Physical Activity: Insufficiently Active (01/23/2023)   Received from [facility]   Exercise Vital Sign    Days of Exercise per Week: 2 days    Minutes of Exercise per Session: 20 min  Stress: No Stress Concern Present (01/23/2023)   Received from [facility]  [employer] of Occupational Health - Occupational Stress Questionnaire    Feeling of Stress : Not at all  Social Connections: Somewhat Isolated (01/23/2023)   Received from [facility]   Social Network    How would you rate your social network (family, work, friends)?: Restricted participation with some degree of social isolation  Intimate Partner Violence: Not At Risk (01/23/2023)   Received from [facility]   HITS    Over the last 12 months how often did your partner physically hurt you?: 1    Over the last 12 months how often did your partner insult you or talk down to you?: 1    Over the last 12 months how often did your partner threaten you with physical harm?: 1    Over the last 12 months how often did your partner scream or curse at you?: 1   No current  facility-administered medications on file prior to encounter.   Current Outpatient Medications on File Prior to Encounter  Medication Sig Dispense Refill   albuterol (PROVENTIL) (2.5 MG/3ML) 0.083% nebulizer solution Take 3 mLs (2.5 mg total) by nebulization every 6 (six) hours as needed for wheezing or shortness of breath. 75 mL 1   albuterol (VENTOLIN HFA) 108 (90 Base) MCG/ACT inhaler Inhale 1-2 puffs into the lungs every 4 (four) hours as needed for wheezing or shortness of breath. 54 g 5   cefadroxil (DURICEF) 500 MG capsule Take 1 capsule (500 mg total) by mouth 2 (two) times daily. 20 capsule 0   labetalol (NORMODYNE) 100 MG tablet Take 1 tablet (100 mg total) by mouth 2 (two) times daily. 90 tablet 1   levothyroxine (SYNTHROID) 25 MCG tablet Take 25 mcg by mouth daily before breakfast.     metFORMIN (GLUCOPHAGE) 500 MG tablet Take 500 mg by mouth 2 (two) times daily with a meal.     ondansetron (ZOFRAN) 8 MG tablet Take 1 tablet (8 mg total) by mouth every 8 (eight) hours as needed for nausea or vomiting. 20 tablet 1   Prenatal Vit-Fe Fumarate-FA (PRENATAL VITAMIN PO) Take 1 tablet by mouth daily at 6 (six) AM.     promethazine (PHENERGAN) 25 MG suppository Place 1 suppository (25 mg total) rectally every 6 (six) hours as needed for nausea or vomiting. 12 each 5   promethazine (PHENERGAN) 25 MG tablet Take 1 tablet (25 mg total) by mouth every 6 (six) hours as needed for nausea or vomiting. 30 tablet 5   SYMBICORT 160-4.5 MCG/ACT inhaler Inhale 2 puffs into the lungs in the morning and at bedtime.     Allergies  Allergen Reactions   Other Other (See Comments)    GI discomfort to wheat, soy, eggs, milk and sesame    I have reviewed patient's Past Medical Hx, Surgical Hx, Family Hx, Social Hx, medications and allergies.   ROS:  Review of Systems  Constitutional:  Positive for chills. Negative for fever.  Respiratory:  Negative for cough.   Gastrointestinal:  Positive for vomiting.  Negative for diarrhea.  Musculoskeletal:  Negative for myalgias.   Review of Systems  Other systems negative   Physical Exam  Physical Exam Patient Vitals for the past 24 hrs:  BP Temp Temp src Pulse Resp SpO2  03/20/23 0621 122/69 98.7 F (37.1 C) Oral (!) 124 19 92 %   Constitutional: Well-developed, well-nourished female in no acute distress.  Cardiovascular: normal rate Respiratory: normal effort GI: Abd soft, non-tender. MS: Extremities nontender, no edema, normal ROM Neurologic:  Alert and oriented x 4.  GU: Neg CVAT.  FHT  by doppler  LAB RESULTS   --/--/A POS (07/10 0920)  IMAGING No results found.  MAU Management/MDM: I have reviewed the triage vital signs and the nursing notes.   Pertinent labs & imaging results that were available during my care of the patient were reviewed by me and considered in my medical decision making (see chart for details).      I have reviewed her medical records including past results, notes and treatments. Medical, Surgical, and family history were reviewed.  Medications and recent lab tests were reviewed  Ordered IV fluids, labs and Phenergan  Treatments in MAU included IV hydration and Phenergan. Will give Pepcid for acid reflux.     ASSESSMENT Single IUP at [redacted]w[redacted]d Nausea and vomiting  PLAN Report given to oncoming provider  Wynelle Bourgeois CNM, MSN Certified Nurse-Midwife 03/20/2023  6:39 AM   - Upon reassessment patient still vomiting and nausea minimally improved.  - Scop patch and Zofran ordered  - Reassessment , symptoms improved and patient feeling better  - plan for discharge.   1. Nausea and vomiting during pregnancy   2. Viral gastroenteritis   3. [redacted] weeks gestation of pregnancy    - Reviewed worsening signs and return precautions.  - Encouraged OP hydration and rest as much as possible.  - Rx for phenergan sent to outpatient pharmacy.  - Patient discharged home in stable condition and may return to MAU  as needed.   Afua Hoots Danella Deis) Suzie Portela, MSN, CNM  [facility]  03/20/2023 11:44 AM

## 2023-03-21 ENCOUNTER — Ambulatory Visit: Payer: Medicaid Other | Admitting: Cardiology

## 2023-03-24 ENCOUNTER — Encounter: Payer: Medicaid Other | Admitting: Obstetrics and Gynecology

## 2023-04-02 ENCOUNTER — Inpatient Hospital Stay (HOSPITAL_COMMUNITY)
Admission: AD | Admit: 2023-04-02 | Discharge: 2023-04-02 | Discharge status: Against Medical Advice | Payer: Medicaid Other | Attending: Obstetrics and Gynecology | Admitting: Obstetrics and Gynecology

## 2023-04-02 DIAGNOSIS — Z3A15 15 weeks gestation of pregnancy: Secondary | ICD-10-CM | POA: Diagnosis not present

## 2023-04-02 DIAGNOSIS — O219 Vomiting of pregnancy, unspecified: Secondary | ICD-10-CM | POA: Diagnosis not present

## 2023-04-02 DIAGNOSIS — Z5321 Procedure and treatment not carried out due to patient leaving prior to being seen by health care provider: Secondary | ICD-10-CM | POA: Insufficient documentation

## 2023-04-02 DIAGNOSIS — O26892 Other specified pregnancy related conditions, second trimester: Secondary | ICD-10-CM | POA: Diagnosis present

## 2023-04-02 NOTE — MAU Note (Signed)
Patient left AMA. Providers made aware.

## 2023-04-02 NOTE — MAU Note (Addendum)
.  Carol Paul is a 27 y.o. at [redacted]w[redacted]d here in MAU reporting: N/V for the past three days without being able to keep anything down. She reports intermittent lower abdominal cramping that began today around 1000 this morning. She reports the cramps occur every 5-10 minutes and last 5-10 seconds. Denies VB or LOF.   Last took Zofran this morning. Last took Promethazine suppository.   Hx twin loss at [redacted]w[redacted]d.  Next OB appointment next Wednesday.  Onset of complaint: x3 days Pain score: 7/10 lower abdomen  FHT: 168 doppler Lab orders placed from triage: UA

## 2023-04-02 NOTE — Progress Notes (Signed)
Pt called , no answer, will call again

## 2023-04-02 NOTE — Progress Notes (Signed)
Called a second time, no answer, will call a 3rd time

## 2023-04-02 NOTE — Progress Notes (Signed)
Called a 3rd time, no answer, and not in lobby.  Will be discharged.

## 2023-04-06 ENCOUNTER — Other Ambulatory Visit: Payer: Self-pay

## 2023-04-06 ENCOUNTER — Encounter (HOSPITAL_BASED_OUTPATIENT_CLINIC_OR_DEPARTMENT_OTHER): Payer: Self-pay | Admitting: Emergency Medicine

## 2023-04-06 ENCOUNTER — Emergency Department (HOSPITAL_BASED_OUTPATIENT_CLINIC_OR_DEPARTMENT_OTHER)
Admission: EM | Admit: 2023-04-06 | Discharge: 2023-04-07 | Discharge status: Against Medical Advice | Payer: Medicaid Other | Attending: Emergency Medicine | Admitting: Emergency Medicine

## 2023-04-06 DIAGNOSIS — J45909 Unspecified asthma, uncomplicated: Secondary | ICD-10-CM | POA: Insufficient documentation

## 2023-04-06 DIAGNOSIS — Z1152 Encounter for screening for COVID-19: Secondary | ICD-10-CM | POA: Diagnosis not present

## 2023-04-06 DIAGNOSIS — O99512 Diseases of the respiratory system complicating pregnancy, second trimester: Secondary | ICD-10-CM | POA: Diagnosis not present

## 2023-04-06 DIAGNOSIS — R000 Tachycardia, unspecified: Secondary | ICD-10-CM | POA: Insufficient documentation

## 2023-04-06 DIAGNOSIS — E119 Type 2 diabetes mellitus without complications: Secondary | ICD-10-CM | POA: Diagnosis not present

## 2023-04-06 DIAGNOSIS — T7840XA Allergy, unspecified, initial encounter: Secondary | ICD-10-CM | POA: Insufficient documentation

## 2023-04-06 DIAGNOSIS — Z3A17 17 weeks gestation of pregnancy: Secondary | ICD-10-CM | POA: Insufficient documentation

## 2023-04-06 DIAGNOSIS — O24112 Pre-existing diabetes mellitus, type 2, in pregnancy, second trimester: Secondary | ICD-10-CM | POA: Insufficient documentation

## 2023-04-06 DIAGNOSIS — Z79899 Other long term (current) drug therapy: Secondary | ICD-10-CM | POA: Diagnosis not present

## 2023-04-06 DIAGNOSIS — I10 Essential (primary) hypertension: Secondary | ICD-10-CM | POA: Insufficient documentation

## 2023-04-06 DIAGNOSIS — Z3A16 16 weeks gestation of pregnancy: Secondary | ICD-10-CM | POA: Insufficient documentation

## 2023-04-06 DIAGNOSIS — Z7984 Long term (current) use of oral hypoglycemic drugs: Secondary | ICD-10-CM | POA: Insufficient documentation

## 2023-04-06 DIAGNOSIS — O10912 Unspecified pre-existing hypertension complicating pregnancy, second trimester: Secondary | ICD-10-CM | POA: Diagnosis not present

## 2023-04-06 DIAGNOSIS — O99712 Diseases of the skin and subcutaneous tissue complicating pregnancy, second trimester: Secondary | ICD-10-CM | POA: Insufficient documentation

## 2023-04-06 DIAGNOSIS — O26892 Other specified pregnancy related conditions, second trimester: Secondary | ICD-10-CM | POA: Diagnosis present

## 2023-04-06 DIAGNOSIS — X58XXXA Exposure to other specified factors, initial encounter: Secondary | ICD-10-CM | POA: Diagnosis not present

## 2023-04-06 LAB — CBC
HCT: 36.8 % (ref 36.0–46.0)
Hemoglobin: 12.5 g/dL (ref 12.0–15.0)
MCH: 30.8 pg (ref 26.0–34.0)
MCHC: 34 g/dL (ref 30.0–36.0)
MCV: 90.6 fL (ref 80.0–100.0)
Platelets: 304 10*3/uL (ref 150–400)
RBC: 4.06 MIL/uL (ref 3.87–5.11)
RDW: 12.3 % (ref 11.5–15.5)
WBC: 10.7 10*3/uL — ABNORMAL HIGH (ref 4.0–10.5)
nRBC: 0 % (ref 0.0–0.2)

## 2023-04-06 LAB — COMPREHENSIVE METABOLIC PANEL
ALT: 34 U/L (ref 0–44)
AST: 47 U/L — ABNORMAL HIGH (ref 15–41)
Albumin: 4.2 g/dL (ref 3.5–5.0)
Alkaline Phosphatase: 45 U/L (ref 38–126)
Anion gap: 13 (ref 5–15)
BUN: <5 mg/dL — ABNORMAL LOW (ref 6–20)
CO2: 18 mmol/L — ABNORMAL LOW (ref 22–32)
Calcium: 9.4 mg/dL (ref 8.9–10.3)
Chloride: 104 mmol/L (ref 98–111)
Creatinine, Ser: 0.49 mg/dL (ref 0.44–1.00)
GFR, Estimated: >60 mL/min (ref >60)
Glucose, Bld: 119 mg/dL — ABNORMAL HIGH (ref 70–99)
Potassium: 3.5 mmol/L (ref 3.5–5.1)
Sodium: 135 mmol/L (ref 135–145)
Total Bilirubin: 0.4 mg/dL (ref 0.3–1.2)
Total Protein: 7.2 g/dL (ref 6.5–8.1)

## 2023-04-06 LAB — HCG, SERUM, QUALITATIVE: Preg, Serum: POSITIVE — AB

## 2023-04-06 MED ORDER — FAMOTIDINE IN NACL 20-0.9 MG/50ML-% IV SOLN
20.0000 mg | Freq: Once | INTRAVENOUS | Status: DC
  Administered 2023-04-06: 20 mg via INTRAVENOUS
  Filled 2023-04-06: qty 50

## 2023-04-06 MED ORDER — EPINEPHRINE 0.3 MG/0.3ML IJ SOAJ
0.3000 mg | Freq: Once | INTRAMUSCULAR | Status: AC
  Administered 2023-04-06: 0.3 mg via INTRAMUSCULAR

## 2023-04-06 MED ORDER — LACTATED RINGERS IV BOLUS
1000.0000 mL | Freq: Once | INTRAVENOUS | Status: AC
  Administered 2023-04-06: 1000 mL via INTRAVENOUS

## 2023-04-06 MED ORDER — DIPHENHYDRAMINE HCL 50 MG/ML IJ SOLN
25.0000 mg | Freq: Once | INTRAMUSCULAR | Status: AC
  Administered 2023-04-06: 25 mg via INTRAVENOUS
  Filled 2023-04-06: qty 1

## 2023-04-06 MED ORDER — METHYLPREDNISOLONE SODIUM SUCC 125 MG IJ SOLR
125.0000 mg | Freq: Once | INTRAMUSCULAR | Status: AC
  Administered 2023-04-06: 125 mg via INTRAVENOUS
  Filled 2023-04-06: qty 2

## 2023-04-06 MED ORDER — EPINEPHRINE 0.15 MG/0.3ML IJ SOAJ
INTRAMUSCULAR | Status: AC
  Filled 2023-04-06: qty 0.6

## 2023-04-06 NOTE — ED Notes (Signed)
Pt states that she feels dizzy and is seeing black feeling like she is going to pass out and feeling more SOB

## 2023-04-06 NOTE — ED Triage Notes (Addendum)
Ate foods white soy wheat dairy and sesame it in. Reports allergy to these ingredients. Feels dizzy, headache, itchy throat. Itching around lips, sob Ingestion around 7pm   Also reports cramping while being [redacted] weeks pregnant  Took benadryl at [facility]

## 2023-04-06 NOTE — ED Notes (Signed)
Report given to the next RN... 

## 2023-04-06 NOTE — ED Provider Notes (Signed)
[place] EMERGENCY DEPARTMENT AT [facility] Provider Note   CSN: [id number] Arrival date & time: 04/06/23  1947     History {Add pertinent medical, surgical, social history, OB history to HPI:1} Chief Complaint  Patient presents with   Allergic Reaction    Carol Paul is a 27 y.o. female who resents with concern for allergic reaction.  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  This is approximately 7 PM.  Started noticing some itchiness and feeling dizzy.  She took 25 mg Benadryl prior to arrival.  Patient reports she is approximately [redacted] weeks pregnant.  Denies any nausea or vomiting, rash, tongue or lip swelling, difficulty breathing.   Allergic Reaction      Home Medications Prior to Admission medications   Medication Sig Start Date End Date Taking? Authorizing Provider  albuterol (PROVENTIL) (2.5 MG/3ML) 0.083% nebulizer solution Take 3 mLs (2.5 mg total) by nebulization every 6 (six) hours as needed for wheezing or shortness of breath. 01/15/23   Venora Maples, MD  albuterol (VENTOLIN HFA) 108 (90 Base) MCG/ACT inhaler Inhale 1-2 puffs into the lungs every 4 (four) hours as needed for wheezing or shortness of breath. 01/15/23   Venora Maples, MD  cefadroxil (DURICEF) 500 MG capsule Take 1 capsule (500 mg total) by mouth 2 (two) times daily. 01/27/23   Raelyn Mora, CNM  labetalol (NORMODYNE) 100 MG tablet Take 1 tablet (100 mg total) by mouth 2 (two) times daily. 01/17/23   Rasch, Victorino Dike I, NP  levothyroxine (SYNTHROID) 25 MCG tablet Take 25 mcg by mouth daily before breakfast.    [provider]  metFORMIN (GLUCOPHAGE) 500 MG tablet Take 500 mg by mouth 2 (two) times daily with a meal.    [provider]  ondansetron (ZOFRAN-ODT) 4 MG disintegrating tablet Take 1 tablet (4 mg total) by mouth every 8 (eight) hours as needed for nausea or vomiting. 03/20/23   Carlynn Herald, CNM  Prenatal Vit-Fe Fumarate-FA (PRENATAL VITAMIN PO) Take 1 tablet by mouth daily at 6 (six) AM.    [provider]  promethazine (PHENERGAN) 25 MG suppository Place 1 suppository (25 mg total) rectally every 6 (six) hours as needed for nausea or vomiting. 01/15/23   Venora Maples, MD  promethazine (PHENERGAN) 25 MG tablet Take 1 tablet (25 mg total) by mouth every 6 (six) hours as needed for nausea or vomiting. 01/15/23   Venora Maples, MD  scopolamine (TRANSDERM-SCOP) 1 MG/3DAYS Place 1 patch (1.5 mg total) onto the skin every 3 (three) days. 03/20/23   Carlynn Herald, CNM  SYMBICORT 160-4.5 MCG/ACT inhaler Inhale 2 puffs into the lungs in the morning and at bedtime. 09/12/22   [provider]      Allergies    Other    Review of Systems   Review of Systems  Neurological:  Positive for dizziness.    Physical Exam Updated Vital Signs BP (!) 140/81 (BP Location: Left Arm)   Pulse (!) 137   Temp 98 F (36.7 C)   Resp (!) 21   LMP 12/16/2022   SpO2 100%  Physical Exam  ED Results / Procedures / Treatments   Labs (all labs ordered are listed, but only abnormal results are displayed) Labs Reviewed  CBC - Abnormal; Notable for the following components:      Result Value   WBC 10.7 (*)    All other  components within normal limits  COMPREHENSIVE METABOLIC PANEL - Abnormal; Notable for the following components:   CO2 18 (*)    Glucose, Bld 119 (*)    BUN <5 (*)    AST 47 (*)    All other components within normal limits  HCG, SERUM, QUALITATIVE - Abnormal; Notable for the following components:   Preg, Serum POSITIVE (*)    All other components within normal limits    EKG None  Radiology No results found.  Procedures Procedures  {Document cardiac monitor, telemetry assessment procedure when appropriate:1}  Medications Ordered in ED Medications  lactated ringers bolus 1,000 mL (has no administration in time range)   methylPREDNISolone sodium succinate (SOLU-MEDROL) 125 mg/2 mL injection 125 mg (125 mg Intravenous Given 04/06/23 2024)  diphenhydrAMINE (BENADRYL) injection 25 mg (25 mg Intravenous Given 04/06/23 2024)    ED Course/ Medical Decision Making/ A&P   {   Click here for ABCD2, HEART and other calculatorsREFRESH Note before signing :1}                              Medical Decision Making Amount and/or Complexity of Data Reviewed Labs: ordered.  Risk Prescription drug management.   ***  {Document critical care time when appropriate:1} {Document review of labs and clinical decision tools ie heart score, Chads2Vasc2 etc:1}  {Document your independent review of radiology images, and any outside records:1} {Document your discussion with family members, caretakers, and with consultants:1} {Document social determinants of health affecting pt's care:1} {Document your decision making why or why not admission, treatments were needed:1} Final Clinical Impression(s) / ED Diagnoses Final diagnoses:  None    Rx / DC Orders ED Discharge Orders     None

## 2023-04-06 NOTE — ED Notes (Signed)
This tech requested for pt to put on a hospital gown and pt refused at this time.

## 2023-04-06 NOTE — ED Notes (Signed)
Pt states all of her symptoms are better, informed pt that observation time after epi is 4 hours minimum.

## 2023-04-06 NOTE — ED Notes (Signed)
Pt states that he symptoms continue of dizziness and stomach cramping. Pt states that she feels as her reaction is not better, PA made aware and will go speak with pt.

## 2023-04-07 ENCOUNTER — Emergency Department (HOSPITAL_BASED_OUTPATIENT_CLINIC_OR_DEPARTMENT_OTHER)
Admission: EM | Admit: 2023-04-07 | Discharge: 2023-04-07 | Discharge status: Against Medical Advice | Payer: Medicaid Other | Attending: Emergency Medicine | Admitting: Emergency Medicine

## 2023-04-07 ENCOUNTER — Other Ambulatory Visit: Payer: Self-pay

## 2023-04-07 DIAGNOSIS — Z7984 Long term (current) use of oral hypoglycemic drugs: Secondary | ICD-10-CM | POA: Insufficient documentation

## 2023-04-07 DIAGNOSIS — J45909 Unspecified asthma, uncomplicated: Secondary | ICD-10-CM | POA: Insufficient documentation

## 2023-04-07 DIAGNOSIS — Z79899 Other long term (current) drug therapy: Secondary | ICD-10-CM | POA: Insufficient documentation

## 2023-04-07 DIAGNOSIS — R000 Tachycardia, unspecified: Secondary | ICD-10-CM | POA: Insufficient documentation

## 2023-04-07 DIAGNOSIS — Z20822 Contact with and (suspected) exposure to covid-19: Secondary | ICD-10-CM | POA: Insufficient documentation

## 2023-04-07 DIAGNOSIS — I10 Essential (primary) hypertension: Secondary | ICD-10-CM | POA: Insufficient documentation

## 2023-04-07 DIAGNOSIS — E119 Type 2 diabetes mellitus without complications: Secondary | ICD-10-CM | POA: Insufficient documentation

## 2023-04-07 LAB — RESP PANEL BY RT-PCR (RSV, FLU A&B, COVID)  RVPGX2
Influenza A by PCR: NEGATIVE
Influenza B by PCR: NEGATIVE
Resp Syncytial Virus by PCR: NEGATIVE
SARS Coronavirus 2 by RT PCR: NEGATIVE

## 2023-04-07 LAB — URINALYSIS, ROUTINE W REFLEX MICROSCOPIC
Bilirubin Urine: NEGATIVE
Glucose, UA: >1000 mg/dL — AB
Ketones, ur: >80 mg/dL — AB
Leukocytes,Ua: NEGATIVE
Nitrite: NEGATIVE
Protein, ur: NEGATIVE mg/dL
Specific Gravity, Urine: 1.014 (ref 1.005–1.030)
pH: 5.5 (ref 5.0–8.0)

## 2023-04-07 MED ORDER — LIDOCAINE VISCOUS HCL 2 % MT SOLN: 15.0000 mL | Freq: Once | OROMUCOSAL | Status: DC

## 2023-04-07 MED ORDER — HYDROXYZINE HCL 25 MG PO TABS
25.0000 mg | ORAL_TABLET | Freq: Once | ORAL | Status: AC
  Administered 2023-04-07: 25 mg via ORAL
  Filled 2023-04-07: qty 1

## 2023-04-07 MED ORDER — LABETALOL HCL 5 MG/ML IV SOLN: 10.0000 mg | Freq: Once | INTRAVENOUS | Status: DC

## 2023-04-07 MED ORDER — EPINEPHRINE 0.3 MG/0.3ML IJ SOAJ: 0.3000 mg | INTRAMUSCULAR | 0 refills | Status: DC | PRN

## 2023-04-07 MED ORDER — LACTATED RINGERS IV BOLUS
1000.0000 mL | Freq: Once | INTRAVENOUS | Status: AC
  Administered 2023-04-07: 1000 mL via INTRAVENOUS

## 2023-04-07 MED ORDER — ALUM & MAG HYDROXIDE-SIMETH 200-200-20 MG/5ML PO SUSP: 30.0000 mL | Freq: Once | ORAL | Status: DC

## 2023-04-07 MED ORDER — LACTATED RINGERS IV BOLUS: 1000.0000 mL | Freq: Once | INTRAVENOUS | Status: DC

## 2023-04-07 NOTE — ED Notes (Signed)
EDP at bedside completing bedside US.

## 2023-04-07 NOTE — ED Notes (Signed)
Pt reports she is concerned regarding vital signs. Reports she is normally hypertensive however vital signs normal despite not taking anti-hypertensive medications. Pt A&O x4. Speaking in complete sentences. Breathing regular and unlabored. Pt reassured vitals are within normal limits. Horton MD notified.

## 2023-04-07 NOTE — ED Provider Notes (Addendum)
Carol Paul   CSN: [id number] Arrival date & time: 04/07/23  0235     History  Chief Complaint  Patient presents with   Allergic Reaction    Carol Paul is a 27 y.o. female.  HPI     This is a 27 year old female who presents with concern for recurrent allergic reaction.  Presents with lip tingling and feeling like her throat is closing.  Patient was given Benadryl, epinephrine, and steroids on recent admission for possible anaphylaxis.  Reports multiple medical allergies.  She is able to give me history.  Denies chest pain or shortness of breath.  Does feel like her throat is swelling and that she is wheezing.  She has a history of asthma and has had worsening reflux and cough recently.  She got home and felt like her lips were tingling so returned.  She has not had any recurrent rash.  No nausea or vomiting.  Of Paul, patient has also noted some lower abdominal cramping.  She is currently almost [redacted] weeks pregnant.  She has not had any vaginal discharge or loss of fluids.  Previously had complicated pregnancy that ended in a spontaneous miscarriage.  Home Medications Prior to Admission medications   Medication Sig Start Date End Date Taking? Authorizing Provider  albuterol (PROVENTIL) (2.5 MG/3ML) 0.083% nebulizer solution Take 3 mLs (2.5 mg total) by nebulization every 6 (six) hours as needed for wheezing or shortness of breath. 01/15/23   Venora Maples, MD  albuterol (VENTOLIN HFA) 108 (90 Base) MCG/ACT inhaler Inhale 1-2 puffs into the lungs every 4 (four) hours as needed for wheezing or shortness of breath. 01/15/23   Venora Maples, MD  cefadroxil (DURICEF) 500 MG capsule Take 1 capsule (500 mg total) by mouth 2 (two) times daily. 01/27/23   Raelyn Mora, CNM  EPINEPHrine 0.3 mg/0.3 mL IJ SOAJ injection Inject 0.3 mg into the muscle as needed for anaphylaxis. 04/07/23   Arabella Merles, PA-C  labetalol  (NORMODYNE) 100 MG tablet Take 1 tablet (100 mg total) by mouth 2 (two) times daily. 01/17/23   Rasch, Victorino Dike I, NP  levothyroxine (SYNTHROID) 25 MCG tablet Take 25 mcg by mouth daily before breakfast.    [provider]  metFORMIN (GLUCOPHAGE) 500 MG tablet Take 500 mg by mouth 2 (two) times daily with a meal.    [provider]  ondansetron (ZOFRAN-ODT) 4 MG disintegrating tablet Take 1 tablet (4 mg total) by mouth every 8 (eight) hours as needed for nausea or vomiting. 03/20/23   Carlynn Herald, CNM  Prenatal Vit-Fe Fumarate-FA (PRENATAL VITAMIN PO) Take 1 tablet by mouth daily at 6 (six) AM.    [provider]  promethazine (PHENERGAN) 25 MG suppository Place 1 suppository (25 mg total) rectally every 6 (six) hours as needed for nausea or vomiting. 01/15/23   Venora Maples, MD  promethazine (PHENERGAN) 25 MG tablet Take 1 tablet (25 mg total) by mouth every 6 (six) hours as needed for nausea or vomiting. 01/15/23   Venora Maples, MD  scopolamine (TRANSDERM-SCOP) 1 MG/3DAYS Place 1 patch (1.5 mg total) onto the skin every 3 (three) days. 03/20/23   Carlynn Herald, CNM  SYMBICORT 160-4.5 MCG/ACT inhaler Inhale 2 puffs into the lungs in the morning and at bedtime. 09/12/22   [provider]      Allergies    Other    Review of Systems   Review of Systems  Constitutional:  Negative for fever.  HENT:  Negative for trouble swallowing.   Respiratory:  Positive for shortness of breath and stridor. Negative for wheezing.   Neurological:  Positive for numbness.  All other systems reviewed and are negative.   Physical Exam Updated Vital Signs BP (!) 134/100   Pulse (!) 130   Temp 98.8 F (37.1 C) (Oral)   Resp 20   Wt 108.5 kg   LMP 12/16/2022   SpO2 97%   BMI 43.75 kg/m  Physical Exam Vitals and nursing Paul reviewed.  Constitutional:      Appearance: She is well-developed. She is obese. She is not ill-appearing.     Comments: ABCs  intact  HENT:     Head: Normocephalic and atraumatic.     Nose: Nose normal.     Mouth/Throat:     Comments: Posterior oropharynx moist and clear Eyes:     Pupils: Pupils are equal, round, and reactive to light.  Cardiovascular:     Rate and Rhythm: Regular rhythm. Tachycardia present.     Heart sounds: Normal heart sounds.  Pulmonary:     Effort: Pulmonary effort is normal. No respiratory distress.     Breath sounds: No stridor. No wheezing.     Comments: No active wheezing, all lung fields are clear, transmitted upper respiratory sounds noted not consistent with stridor  Abdominal:     Palpations: Abdomen is soft.     Tenderness: There is no guarding.  Musculoskeletal:     Cervical back: Neck supple.  Skin:    General: Skin is warm and dry.  Neurological:     Mental Status: She is alert and oriented to person, place, and time.  Psychiatric:        Mood and Affect: Mood normal.     ED Results / Procedures / Treatments   Labs (all labs ordered are listed, but only abnormal results are displayed) Labs Reviewed  URINALYSIS, ROUTINE W REFLEX MICROSCOPIC - Abnormal; Notable for the following components:      Result Value   Glucose, UA >1,000 (*)    Hgb urine dipstick SMALL (*)    Ketones, ur >80 (*)    Bacteria, UA RARE (*)    All other components within normal limits  RESP PANEL BY RT-PCR (RSV, FLU A&B, COVID)  RVPGX2    EKG None  Radiology No results found.  Procedures Ultrasound ED OB Pelvic  Date/Time: 04/07/2023 6:19 AM  Performed by: Shon Baton, MD Authorized by: Shon Baton, MD   Procedure details:    Indications: evaluate for IUP     Assess:  Fetal viability   Images: not archived   Study Limitations: body habitus Uterine findings:    Fetal heart rate: identified     Estimated gestational age: 54 weeks Left ovary findings:    Left ovary:  Not visualized    Right ovary findings:     Right ovary:  Not visualized         Medications Ordered in ED Medications  lactated ringers bolus 1,000 mL (has no administration in time range)  labetalol (NORMODYNE) injection 10 mg (has no administration in time range)  alum & mag hydroxide-simeth (MAALOX/MYLANTA) 200-200-20 MG/5ML suspension 30 mL (has no administration in time range)    And  lidocaine (XYLOCAINE) 2 % viscous mouth solution 15 mL (has no administration in time range)  hydrOXYzine (ATARAX) tablet 25 mg (25 mg Oral Given 04/07/23 0359)  lactated ringers bolus 1,000 mL (  0 mLs Intravenous Stopped 04/07/23 0550)    ED Course/ Medical Decision Making/ A&P Clinical Course as of 04/07/23 7829  [facility] Apr 07, 2023  5621 Had a long discussion with the patient and her spouse.  Patient having episodes of coughing and upper respiratory sounds.  Not consistent with true stridor and patient is able to stop and have a full conversation.  I discussed with him that I did not feel that this represented her throat closing up.  I am reassured that she is not in anaphylaxis.  Patient is very anxious about her symptoms.  She states she was told to return if she was having the symptoms.  She has been clinically stable for the last 3 hours with the exception of persistent tachycardia which she has a history of.  I am concerned about this.  She also endorses to me that she has significant reflux.  This could be causing cough and throat symptoms.  Will give a GI cocktail.  She has not taken her labetalol tonight.  Will give IV labetalol and continue fluids for her heart rate.  Will continue to monitor. [CH]    Clinical Course User Index [CH] Aspynn Clover, Mayer Masker, MD                                 Medical Decision Making Amount and/or Complexity of Data Reviewed Labs: ordered.  Risk OTC drugs. Prescription drug management.   This patient presents to the ED for concern of allergic reaction, this involves an extensive number of treatment options, and is a complaint that carries  with it a high risk of complications and morbidity.  I considered the following differential and admission for this acute, potentially life threatening condition.  The differential diagnosis includes allergic reaction, reflux, anxiety, inappropriate tachycardia MDM:    This is a 27 year old female who presents with concern for ongoing allergic reaction.  Was treated for anaphylaxis but left prior to full observation period.  Returns with lip tingling and concerns for her throat swelling.  She is nontoxic.  Vital signs notable for tachycardia.  She had persistent tachycardia during her prior admission.  Otherwise ABCs are intact.  She has no objective signs of anaphylaxis.  Have low suspicion for rebound at this point.  I am however concerned about her tachycardia.  Have given her fluids and a dose of Atarax.  I do suspect that there is some component of anxiety given her symptoms and history.  However, she could have some reflux as well.  Patient during her stay was persistently concerned about her blood pressure and concern for anaphylaxis.  I again reassured her that she was clinically stable and that I did not feel her clinical presentation was consistent.  She at 1 point had a episode and I walked into the room.  She again was having transmitted upper airway sounds.  However she could stop and have a full conversation with me.  I discussed with her that I felt that these upper airway sounds were not consistent with stridor or her throat closing up.  I am reassured that she can have a full conversation with me and is otherwise in no distress.  These episodes abort spontaneously.  She again confirms that she has not had any upper respiratory symptoms to suggest viral etiology or croup-like illness.  Patient then requested that I review her urinalysis from recent visit to [facility].  Urinalysis  at that time had 3+ ketones and was nitrite positive but minimal white cells.  No culture.  She states she is  having urinary symptoms.  Urine here does not indicate an infection.  However will culture and treat given that she is pregnant.  Also plan to give a dose of labetalol which she missed tonight and continue to hydrate.  Unfortunately, patient left prior to these interventions.  I was unable to speak with her again regarding her decision to leave.  Per nursing she ambulated independently without difficulty.  (Labs, imaging, consults)  Labs: I Ordered, and personally interpreted labs.  The pertinent results include: Urinalysis, COVID  Imaging Studies ordered: I ordered imaging studies including none I independently visualized and interpreted imaging. I agree with the radiologist interpretation  Additional history obtained from chart review.  External records from outside source obtained and reviewed including prior evaluations  Cardiac Monitoring: The patient was maintained on a cardiac monitor.  If on the cardiac monitor, I personally viewed and interpreted the cardiac monitored which showed an underlying rhythm of: Sinus tachycardia  Reevaluation: After the interventions noted above, I reevaluated the patient and found that they have :stayed the same  Social Determinants of Health:  lives independently  Disposition: AMA  Co morbidities that complicate the patient evaluation  Past Medical History:  Diagnosis Date   Anxiety    Asthma    Diabetes mellitus without complication (HCC)    Hypertension    Hyperthyroidism    Ovarian cyst    UTI (urinary tract infection)      Medicines Meds ordered this encounter  Medications   hydrOXYzine (ATARAX) tablet 25 mg   lactated ringers bolus 1,000 mL   lactated ringers bolus 1,000 mL   labetalol (NORMODYNE) injection 10 mg   AND Linked Order Group    alum & mag hydroxide-simeth (MAALOX/MYLANTA) 200-200-20 MG/5ML suspension 30 mL    lidocaine (XYLOCAINE) 2 % viscous mouth solution 15 mL    I have reviewed the patients home medicines  and have made adjustments as needed  Problem List / ED Course: Problem List Items Addressed This Visit   None Visit Diagnoses     Tachycardia    -  Primary                   Final Clinical Impression(s) / ED Diagnoses Final diagnoses:  Tachycardia    Rx / DC Orders ED Discharge Orders     None         Murry Khiev, Mayer Masker, MD 04/07/23 1610    Shon Baton, MD 04/07/23 [phone]

## 2023-04-07 NOTE — ED Triage Notes (Signed)
Pt to triage c/o lip tingling and SOB resulting from possible allergic reaction. Pt seen and treated earlier for same left AMA. PT appears anxious. VSS NAD PT on room air.

## 2023-04-07 NOTE — ED Notes (Signed)
Pt spoke to RT and her provider and requested to leave AMA. Pt understood, agreed, and signed AMA form.

## 2023-04-07 NOTE — ED Notes (Signed)
Pt in room talking with MD. Pt removed IV by pulling out. Pt verbally aggressive with MD discussing plan and goals of care. Pt and significant other argumentative regarding treatment plan. Pt reports "I dont feel like anyone is listening to me and you're telling me I am faking it". Pt reassured by MD. Pt agreeable to stay after conversation with MD with this RN present. Pending additional MD orders.

## 2023-04-07 NOTE — ED Triage Notes (Signed)
PT speaking with full clear voice.

## 2023-04-07 NOTE — ED Notes (Signed)
Pt refused medications and IV start. Reports would like to leave and follow-up with maternity in the am. Pt declined speaking with the MD prior to leaving. AMA paperwork signed. Pt ambulated out of department independently.

## 2023-04-19 ENCOUNTER — Encounter (HOSPITAL_COMMUNITY): Payer: Self-pay | Admitting: Obstetrics and Gynecology

## 2023-04-19 ENCOUNTER — Inpatient Hospital Stay (HOSPITAL_COMMUNITY)
Admission: AD | Admit: 2023-04-19 | Discharge: 2023-04-19 | Discharge status: Against Medical Advice | Payer: Medicaid Other | Attending: Obstetrics and Gynecology | Admitting: Obstetrics and Gynecology

## 2023-04-19 DIAGNOSIS — R059 Cough, unspecified: Secondary | ICD-10-CM | POA: Diagnosis not present

## 2023-04-19 DIAGNOSIS — Z1152 Encounter for screening for COVID-19: Secondary | ICD-10-CM | POA: Diagnosis not present

## 2023-04-19 DIAGNOSIS — O26892 Other specified pregnancy related conditions, second trimester: Secondary | ICD-10-CM | POA: Insufficient documentation

## 2023-04-19 DIAGNOSIS — R0602 Shortness of breath: Secondary | ICD-10-CM | POA: Insufficient documentation

## 2023-04-19 DIAGNOSIS — Z3A17 17 weeks gestation of pregnancy: Secondary | ICD-10-CM | POA: Insufficient documentation

## 2023-04-19 DIAGNOSIS — M549 Dorsalgia, unspecified: Secondary | ICD-10-CM | POA: Diagnosis present

## 2023-04-19 DIAGNOSIS — O219 Vomiting of pregnancy, unspecified: Secondary | ICD-10-CM | POA: Diagnosis not present

## 2023-04-19 LAB — RESPIRATORY PANEL BY PCR

## 2023-04-19 LAB — URINALYSIS, ROUTINE W REFLEX MICROSCOPIC
Bilirubin Urine: NEGATIVE
Glucose, UA: NEGATIVE mg/dL
Ketones, ur: NEGATIVE mg/dL
Leukocytes,Ua: NEGATIVE
Nitrite: NEGATIVE
Protein, ur: 100 mg/dL — AB
Specific Gravity, Urine: 1.006 (ref 1.005–1.030)
pH: 5 (ref 5.0–8.0)

## 2023-04-19 MED ORDER — PROCHLORPERAZINE EDISYLATE 10 MG/2ML IJ SOLN
10.0000 mg | Freq: Once | INTRAMUSCULAR | Status: AC
  Administered 2023-04-19: 10 mg via INTRAVENOUS
  Filled 2023-04-19: qty 2

## 2023-04-19 NOTE — MAU Provider Note (Signed)
History     CSN: [id number]  Arrival date and time: 04/19/23 1534   None     Chief Complaint  Patient presents with   Back Pain   HPI  OB History     Gravida  2   Para  1   Term  0   Preterm  1   AB  1   Living  0      SAB  0   IAB  0   Ectopic  0   Multiple  1   Live Births  1           Past Medical History:  Diagnosis Date   Anxiety    Asthma    Diabetes mellitus without complication (HCC)    Hypertension    Hyperthyroidism    Ovarian cyst    UTI (urinary tract infection)     Past Surgical History:  Procedure Laterality Date   ANKLE SURGERY Left    TONSILLECTOMY      Family History  Problem Relation Age of Onset   Healthy Mother    Hypothyroidism Father    CAD Other     Social History   Tobacco Use   Smoking status: Former    Passive exposure: Current   Smokeless tobacco: Never  Vaping Use   Vaping status: Never Used  Substance Use Topics   Alcohol use: Not Currently    Comment: social   Drug use: Not Currently    Types: Marijuana    Comment: daily    Allergies:  Allergies  Allergen Reactions   Other Other (See Comments)    GI discomfort to wheat, soy, eggs, milk and sesame    No medications prior to admission.    Review of Systems  Constitutional:  Negative for chills, fatigue, fever and unexpected weight change.  Respiratory:  Positive for cough and shortness of breath.   Cardiovascular:  Negative for chest pain and palpitations.  Gastrointestinal:  Positive for abdominal pain, constipation, nausea and vomiting. Negative for diarrhea.  Genitourinary:  Positive for decreased urine volume and difficulty urinating. Negative for flank pain, frequency, urgency, vaginal bleeding and vaginal discharge.  Neurological:  Negative for dizziness and headaches.  Psychiatric/Behavioral:  Negative for confusion and suicidal ideas.    Physical Exam   Blood pressure (!) 129/91, pulse (!) 115, temperature 98.6 F (37 C),  temperature source Oral, resp. rate 20, weight 105.7 kg, last menstrual period 12/16/2022, SpO2 99%, unknown if currently breastfeeding.  Physical Exam Vitals reviewed.  Constitutional:      Appearance: Normal appearance.  HENT:     Head: Normocephalic.  Cardiovascular:     Rate and Rhythm: Normal rate and regular rhythm.     Pulses: Normal pulses.     Heart sounds: Normal heart sounds.  Pulmonary:     Effort: Pulmonary effort is normal.     Breath sounds: Normal breath sounds.  Skin:    General: Skin is warm and dry.     Capillary Refill: Capillary refill takes less than 2 seconds.  Neurological:     Mental Status: She is alert and oriented to person, place, and time.  Psychiatric:        Mood and Affect: Mood normal.        Behavior: Behavior normal.        Thought Content: Thought content normal.        Judgment: Judgment normal.     Fetal Assessment 168 bpm via doppler  MAU  Course   Results for orders placed or performed during the hospital encounter of 04/19/23 (from the past 24 hour(s))  Urinalysis, Routine w reflex microscopic -Urine, Clean Catch     Status: Abnormal   Collection Time: 04/19/23  4:00 PM  Result Value Ref Range   Color, Urine AMBER (A) YELLOW   APPearance CLOUDY (A) CLEAR   Specific Gravity, Urine 1.006 1.005 - 1.030   pH 5.0 5.0 - 8.0   Glucose, UA NEGATIVE NEGATIVE mg/dL   Hgb urine dipstick LARGE (A) NEGATIVE   Bilirubin Urine NEGATIVE NEGATIVE   Ketones, ur NEGATIVE NEGATIVE mg/dL   Protein, ur 161 (A) NEGATIVE mg/dL   Nitrite NEGATIVE NEGATIVE   Leukocytes,Ua NEGATIVE NEGATIVE   RBC / HPF 0-5 0 - 5 RBC/hpf   WBC, UA 21-50 0 - 5 WBC/hpf   Bacteria, UA RARE (A) NONE SEEN   Squamous Epithelial / HPF 11-20 0 - 5 /HPF   WBC Clumps PRESENT    Mucus PRESENT    No results found.  MDM PE Benign findings Labs: Orders placed for UA, UDS, Urine culture, CMP, CBC, Quantiferon gold, Respiratory panel (20 pathogen), TSH. EFM: 168 via  dopper  Assessment and Plan  27yo G2P0  SIUP at 17 weeks, 5 days  - Urine findings discussed. - Plan of care discussed.  - IV started per orders, Compazine administered by nursing staff. Patient requested nursing staff to remove IV after compazine administration.  - Patient left before all ordered labs could be collected. UA, UDS, Urine culture in lab. Respiratory panel in lab. Results pending.  - Patient left AMA @ 1800 04/19/2023.    Richardson Landry MSN, CNM 04/19/2023, 6:20 PM

## 2023-04-19 NOTE — MAU Note (Signed)
Patient's partner came to nurse's station telling staff that patient reported burning in her IV site. RN went to room and assessed the site. IV flushed well. No redness, warmth, or swelling noted. Patient informed RN that it was burning above the site. RN removed the IV. RN educated the patient that bruising may be normal, but if she has any swelling, warmth, redness, or pain, she should come back, RN informed patient that she would contact IV team to come look for another site. Patient asked if another IV was necessary. RN replied that she was not the primary nurse involved in her care and she wasn't sure of her care plan, so she would find out. Patient agreed.

## 2023-04-19 NOTE — MAU Note (Signed)
..  Carol Paul is a 27 y.o. at [redacted]w[redacted]d here in MAU reporting: since she got pregnant she's felt like she's had a UTI multiple times it has been tested and not shown anything. Today she started to have left sided flank pain and around 1500 she voided and it was brown/black. Denies VB or LOF.   Pain score: 5 Vitals:   04/19/23 1544  BP: (!) 129/91  Pulse: (!) 115  Resp: 20  Temp: 98.6 F (37 C)  SpO2: 99%     FHT:168 Lab orders placed from triage:   UA

## 2023-04-19 NOTE — MAU Note (Signed)
This RN went in to update patient and wife about the plan of care and that she did not have to have her IV replaced if patient preferred. Once in the room, patient and significant other appear to have left AMA. Patient was called from lobby and is not on the unit. Provider, Lamont Snowball, CNM, notified of patient elopement. Patient discharged AMA.

## 2023-04-21 LAB — CULTURE, OB URINE

## 2023-04-23 ENCOUNTER — Emergency Department (HOSPITAL_BASED_OUTPATIENT_CLINIC_OR_DEPARTMENT_OTHER)
Admission: EM | Admit: 2023-04-23 | Discharge: 2023-04-23 | Disposition: A | Discharge status: Home / Self Care | Payer: Medicaid Other | Attending: Emergency Medicine | Admitting: Emergency Medicine

## 2023-04-23 ENCOUNTER — Other Ambulatory Visit: Payer: Self-pay

## 2023-04-23 DIAGNOSIS — Z87891 Personal history of nicotine dependence: Secondary | ICD-10-CM | POA: Insufficient documentation

## 2023-04-23 DIAGNOSIS — I10 Essential (primary) hypertension: Secondary | ICD-10-CM | POA: Diagnosis not present

## 2023-04-23 DIAGNOSIS — E119 Type 2 diabetes mellitus without complications: Secondary | ICD-10-CM | POA: Diagnosis not present

## 2023-04-23 DIAGNOSIS — T7840XA Allergy, unspecified, initial encounter: Secondary | ICD-10-CM

## 2023-04-23 DIAGNOSIS — J45909 Unspecified asthma, uncomplicated: Secondary | ICD-10-CM | POA: Diagnosis not present

## 2023-04-23 DIAGNOSIS — R0602 Shortness of breath: Secondary | ICD-10-CM | POA: Diagnosis present

## 2023-04-23 MED ORDER — METHYLPREDNISOLONE SODIUM SUCC 125 MG IJ SOLR
125.0000 mg | Freq: Once | INTRAMUSCULAR | Status: AC
  Administered 2023-04-23: 125 mg via INTRAVENOUS
  Filled 2023-04-23: qty 2

## 2023-04-23 MED ORDER — SODIUM CHLORIDE 0.9 % IV BOLUS
1000.0000 mL | Freq: Once | INTRAVENOUS | Status: AC
  Administered 2023-04-23: 1000 mL via INTRAVENOUS

## 2023-04-23 MED ORDER — EPINEPHRINE 0.3 MG/0.3ML IJ SOAJ
0.3000 mg | Freq: Once | INTRAMUSCULAR | Status: AC
  Administered 2023-04-23: 0.3 mg via INTRAMUSCULAR

## 2023-04-23 MED ORDER — EPINEPHRINE 0.3 MG/0.3ML IJ SOAJ: 0.3000 mg | INTRAMUSCULAR | 1 refills | Status: DC | PRN

## 2023-04-23 MED ORDER — FAMOTIDINE IN NACL 20-0.9 MG/50ML-% IV SOLN
20.0000 mg | Freq: Once | INTRAVENOUS | Status: AC
  Administered 2023-04-23: 20 mg via INTRAVENOUS
  Filled 2023-04-23: qty 50

## 2023-04-23 MED ORDER — DIPHENHYDRAMINE HCL 50 MG/ML IJ SOLN
25.0000 mg | Freq: Once | INTRAMUSCULAR | Status: AC
  Administered 2023-04-23: 25 mg via INTRAVENOUS
  Filled 2023-04-23: qty 1

## 2023-04-23 MED ORDER — EPINEPHRINE 0.3 MG/0.3ML IJ SOAJ
INTRAMUSCULAR | Status: AC
  Filled 2023-04-23: qty 0.3

## 2023-04-23 NOTE — ED Triage Notes (Signed)
Pt POV from home reporting allergic reaction after eating soy. Pt administered epi pen about an hour ago with improvement then sx returned, now hoarse and SOB

## 2023-04-23 NOTE — ED Notes (Signed)
ED Provider at bedside. 

## 2023-04-23 NOTE — ED Notes (Signed)
Pt sts that SOB and hoarseness have improved. Denies further needs and verbalized understanding of the call light.

## 2023-04-23 NOTE — ED Provider Notes (Signed)
[facility] Emergency Department Provider Note MRN:  [id number]  Arrival date & time: 04/23/23     Chief Complaint   Allergic Reaction   History of Present Illness   Carol Paul is a 27 y.o. year-old female with a history of DM presenting to the ED with chief complaint of allergic reaction.  Ate soy product this evening and now with sensation of throat closing and SOB.  Some rash.  Epipen at home did not work well.  Review of Systems  A thorough review of systems was obtained and all systems are negative except as noted in the HPI and PMH.   Patient's Health History    Past Medical History:  Diagnosis Date   Anxiety    Asthma    Diabetes mellitus without complication (HCC)    Hypertension    Hyperthyroidism    Ovarian cyst    UTI (urinary tract infection)     Past Surgical History:  Procedure Laterality Date   ANKLE SURGERY Left    TONSILLECTOMY      Family History  Problem Relation Age of Onset   Healthy Mother    Hypothyroidism Father    CAD Other     Social History   Socioeconomic History   Marital status: Married    Spouse name: Lequita Asal   Number of children: Not on file   Years of education: Not on file   Highest education level: Not on file  Occupational History   Not on file  Tobacco Use   Smoking status: Former    Passive exposure: Current   Smokeless tobacco: Never  Vaping Use   Vaping status: Never Used  Substance and Sexual Activity   Alcohol use: Not Currently    Comment: social   Drug use: Not Currently    Types: Marijuana    Comment: daily   Sexual activity: Yes    Birth control/protection: None  Other Topics Concern   Not on file  Social History Narrative   Not on file   Social Determinants of Health   Financial Resource Strain: Low Risk  (04/09/2023)   Received from [facility]   Overall Financial Resource Strain (CARDIA)    Difficulty of Paying Living Expenses: Not hard at  all  Food Insecurity: No Food Insecurity (04/09/2023)   Received from [facility]   Hunger Vital Sign    Worried About Running Out of Food in the Last Year: Never true    Ran Out of Food in the Last Year: Never true  Transportation Needs: No Transportation Needs (04/09/2023)   Received from [facility] - Transportation    In the past 12 months, has lack of transportation kept you from medical appointments or from getting medications?: No    Lack of Transportation (Non-Medical): No  Physical Activity: Insufficiently Active (04/09/2023)   Received from [facility]   Exercise Vital Sign    Days of Exercise per Week: 2 days    Minutes of Exercise per Session: 30 min  Stress: Stress Concern Present (04/09/2023)   Received from [facility] - Occupational Stress Questionnaire    Feeling of Stress : Rather much  Social Connections: Moderately Integrated (04/09/2023)   Received from [facility]   Social Connection and Isolation Panel [NHANES]    Frequency of Communication with Friends and Family: More than three times a  week    Frequency of Social Gatherings with Friends and Family: Never    Attends Religious Services: Never    Database administrator or Organizations: Yes    Attends Banker Meetings: Never    Marital Status: Married  Recent Concern: Social Connections - Somewhat Isolated (01/23/2023)   Received from [employer]   Social Network    How would you rate your social network (family, work, friends)?: Restricted participation with some degree of social isolation  Intimate Partner Violence: Not At Risk (01/23/2023)   Received from [facility]   HITS    Over the last 12 months how often did your partner physically hurt you?: 1    Over the last 12 months how often did your partner insult you or talk down to you?: 1    Over the  last 12 months how often did your partner threaten you with physical harm?: 1    Over the last 12 months how often did your partner scream or curse at you?: 1     Physical Exam   Vitals:   04/23/23 0400 04/23/23 0430  BP: 120/71 100/62  Pulse: 93 99  Resp: 19 17  Temp:    SpO2: 97% 97%    CONSTITUTIONAL:  well-appearing, NAD NEURO/PSYCH:  Alert and oriented x 3, no focal deficits EYES:  eyes equal and reactive ENT/NECK:  no LAD, no JVD CARDIO:  regular rate, well-perfused, normal S1 and S2 PULM:  CTAB no wheezing or rhonchi GI/GU:  non-distended, non-tender MSK/SPINE:  No gross deformities, no edema SKIN:  no rash, atraumatic   *Additional and/or pertinent findings included in MDM below  Diagnostic and Interventional Summary    EKG Interpretation Date/Time:    Ventricular Rate:    PR Interval:    QRS Duration:    QT Interval:    QTC Calculation:   R Axis:      Text Interpretation:         Labs Reviewed - No data to display  No orders to display    Medications  EPINEPHrine (EPI-PEN) 0.3 mg/0.3 mL injection (  Not Given 04/23/23 0124)  EPINEPHrine (EPI-PEN) injection 0.3 mg (0.3 mg Intramuscular Given 04/23/23 0117)  diphenhydrAMINE (BENADRYL) injection 25 mg (25 mg Intravenous Given 04/23/23 0121)  famotidine (PEPCID) IVPB 20 mg premix (0 mg Intravenous Stopped 04/23/23 0151)  methylPREDNISolone sodium succinate (SOLU-MEDROL) 125 mg/2 mL injection 125 mg (125 mg Intravenous Given 04/23/23 0121)  sodium chloride 0.9 % bolus 1,000 mL (0 mLs Intravenous Stopped 04/23/23 0257)     Procedures  /  Critical Care .Critical Care  Performed by: Sabas Sous, MD Authorized by: Sabas Sous, MD   Critical care provider statement:    Critical care time (minutes):  32   Critical care was necessary to treat or prevent imminent or life-threatening deterioration of the following conditions: Concern for anaphylaxis.   Critical care was time spent personally by me on  the following activities:  Development of treatment plan with patient or surrogate, discussions with consultants, evaluation of patient's response to treatment, examination of patient, ordering and review of laboratory studies, ordering and review of radiographic studies, ordering and performing treatments and interventions, pulse oximetry, re-evaluation of patient's condition and review of old charts   ED Course and Medical Decision Making  Initial Impression and Ddx Concern for possible anaphylaxis, h/o the same.  Per chart review there may be a anxiety component to her presentation.    Past  medical/surgical history that increases complexity of ED encounter:  none  Interpretation of Diagnostics Laboratory and/or imaging options to aid in the diagnosis/care of the patient were considered.  After careful history and physical examination, it was determined that there was no indication for diagnostics at this time.  Patient Reassessment and Ultimate Disposition/Management     Observed for 4 hours with no return of symptoms, appropriate for discharge.  Patient management required discussion with the following services or consulting groups:  None  Complexity of Problems Addressed Acute illness or injury that poses threat of life of bodily function  Additional Data Reviewed and Analyzed Further history obtained from: Further history from spouse/family member  Additional Factors Impacting ED Encounter Risk Prescriptions  Elmer Sow. Pilar Plate, MD [facility] Emergency Medicine [facility] [email or web address]  Final Clinical Impressions(s) / ED Diagnoses     ICD-10-CM   1. Allergic reaction, initial encounter  T78.40XA       ED Discharge Orders          Ordered    EPINEPHrine 0.3 mg/0.3 mL IJ SOAJ injection  As needed        04/23/23 0452             Discharge Instructions Discussed with and Provided to Patient:    Discharge Instructions      You were  evaluated in the Emergency Department and after careful evaluation, we did not find any emergent condition requiring admission or further testing in the hospital.  Your exam/testing today was overall reassuring.  Symptoms likely due to allergic reaction.  Please return to the Emergency Department if you experience any worsening of your condition.  Thank you for allowing [place] to be a part of your care.       Sabas Sous, MD 04/23/23 [phone]

## 2023-04-23 NOTE — Discharge Instructions (Addendum)
You were evaluated in the Emergency Department and after careful evaluation, we did not find any emergent condition requiring admission or further testing in the hospital.  Your exam/testing today was overall reassuring.  Symptoms likely due to allergic reaction.  Please return to the Emergency Department if you experience any worsening of your condition.  Thank you for allowing [place] to be a part of your care.

## 2023-04-27 ENCOUNTER — Emergency Department (HOSPITAL_BASED_OUTPATIENT_CLINIC_OR_DEPARTMENT_OTHER)
Admission: EM | Admit: 2023-04-27 | Discharge: 2023-04-27 | Disposition: A | Discharge status: Home / Self Care | Payer: Medicaid Other | Attending: Emergency Medicine | Admitting: Emergency Medicine

## 2023-04-27 ENCOUNTER — Encounter (HOSPITAL_BASED_OUTPATIENT_CLINIC_OR_DEPARTMENT_OTHER): Payer: Self-pay | Admitting: Emergency Medicine

## 2023-04-27 ENCOUNTER — Emergency Department (HOSPITAL_BASED_OUTPATIENT_CLINIC_OR_DEPARTMENT_OTHER)
Admission: EM | Admit: 2023-04-27 | Discharge: 2023-04-28 | Discharge status: Against Medical Advice | Payer: Medicaid Other | Attending: Emergency Medicine | Admitting: Emergency Medicine

## 2023-04-27 ENCOUNTER — Encounter (HOSPITAL_BASED_OUTPATIENT_CLINIC_OR_DEPARTMENT_OTHER): Payer: Self-pay

## 2023-04-27 ENCOUNTER — Other Ambulatory Visit: Payer: Self-pay

## 2023-04-27 DIAGNOSIS — E1165 Type 2 diabetes mellitus with hyperglycemia: Secondary | ICD-10-CM | POA: Insufficient documentation

## 2023-04-27 DIAGNOSIS — O99810 Abnormal glucose complicating pregnancy: Secondary | ICD-10-CM | POA: Insufficient documentation

## 2023-04-27 DIAGNOSIS — Z3A20 20 weeks gestation of pregnancy: Secondary | ICD-10-CM | POA: Diagnosis not present

## 2023-04-27 DIAGNOSIS — R000 Tachycardia, unspecified: Secondary | ICD-10-CM | POA: Insufficient documentation

## 2023-04-27 DIAGNOSIS — O99342 Other mental disorders complicating pregnancy, second trimester: Secondary | ICD-10-CM | POA: Diagnosis not present

## 2023-04-27 DIAGNOSIS — O24112 Pre-existing diabetes mellitus, type 2, in pregnancy, second trimester: Secondary | ICD-10-CM | POA: Diagnosis not present

## 2023-04-27 DIAGNOSIS — X58XXXA Exposure to other specified factors, initial encounter: Secondary | ICD-10-CM | POA: Insufficient documentation

## 2023-04-27 DIAGNOSIS — R739 Hyperglycemia, unspecified: Secondary | ICD-10-CM

## 2023-04-27 DIAGNOSIS — O368120 Decreased fetal movements, second trimester, not applicable or unspecified: Secondary | ICD-10-CM | POA: Diagnosis not present

## 2023-04-27 DIAGNOSIS — Z5329 Procedure and treatment not carried out because of patient's decision for other reasons: Secondary | ICD-10-CM | POA: Insufficient documentation

## 2023-04-27 DIAGNOSIS — Z7984 Long term (current) use of oral hypoglycemic drugs: Secondary | ICD-10-CM | POA: Insufficient documentation

## 2023-04-27 DIAGNOSIS — R0602 Shortness of breath: Secondary | ICD-10-CM | POA: Diagnosis not present

## 2023-04-27 DIAGNOSIS — F319 Bipolar disorder, unspecified: Secondary | ICD-10-CM | POA: Diagnosis not present

## 2023-04-27 DIAGNOSIS — T782XXA Anaphylactic shock, unspecified, initial encounter: Secondary | ICD-10-CM | POA: Insufficient documentation

## 2023-04-27 DIAGNOSIS — Z794 Long term (current) use of insulin: Secondary | ICD-10-CM | POA: Insufficient documentation

## 2023-04-27 DIAGNOSIS — O99891 Other specified diseases and conditions complicating pregnancy: Secondary | ICD-10-CM | POA: Diagnosis not present

## 2023-04-27 LAB — CBG MONITORING, ED: Glucose-Capillary: 185 mg/dL — ABNORMAL HIGH (ref 70–99)

## 2023-04-27 MED ORDER — DIPHENHYDRAMINE HCL 50 MG/ML IJ SOLN
50.0000 mg | Freq: Once | INTRAMUSCULAR | Status: AC
  Administered 2023-04-27: 50 mg via INTRAVENOUS
  Filled 2023-04-27: qty 1

## 2023-04-27 MED ORDER — EPINEPHRINE 0.3 MG/0.3ML IJ SOAJ: 0.3000 mg | INTRAMUSCULAR | 0 refills | Status: DC | PRN

## 2023-04-27 MED ORDER — IPRATROPIUM-ALBUTEROL 0.5-2.5 (3) MG/3ML IN SOLN: 3.0000 mL | Freq: Once | RESPIRATORY_TRACT | Status: AC

## 2023-04-27 MED ORDER — IPRATROPIUM-ALBUTEROL 0.5-2.5 (3) MG/3ML IN SOLN
RESPIRATORY_TRACT | Status: AC
  Administered 2023-04-27: 3 mL via RESPIRATORY_TRACT
  Filled 2023-04-27: qty 3

## 2023-04-27 MED ORDER — EPINEPHRINE 0.3 MG/0.3ML IJ SOAJ
0.3000 mg | Freq: Once | INTRAMUSCULAR | Status: AC
  Administered 2023-04-27: 0.3 mg via INTRAMUSCULAR
  Filled 2023-04-27: qty 0.3

## 2023-04-27 MED ORDER — DEXAMETHASONE SODIUM PHOSPHATE 10 MG/ML IJ SOLN
10.0000 mg | Freq: Once | INTRAMUSCULAR | Status: AC
  Administered 2023-04-27: 10 mg via INTRAVENOUS
  Filled 2023-04-27: qty 1

## 2023-04-27 MED ORDER — FAMOTIDINE IN NACL 20-0.9 MG/50ML-% IV SOLN
20.0000 mg | Freq: Once | INTRAVENOUS | Status: AC
  Administered 2023-04-27: 20 mg via INTRAVENOUS
  Filled 2023-04-27: qty 50

## 2023-04-27 NOTE — ED Notes (Signed)
Labs drawn and sent to lab for hold

## 2023-04-27 NOTE — ED Provider Notes (Signed)
[place] EMERGENCY DEPARTMENT AT [facility] Provider Note   CSN: [id number] Arrival date & time: 04/27/23  1459     History Chief Complaint  Patient presents with   Allergic Reaction    HPI Carol Paul is a 27 y.o. female presenting for anaphylaxis.  History of similar frequent evaluation for similar. Multiple triggers.  Started 30 minutes prior to arrival gave her self epinephrine IM 20 minutes prior to arrival still symptomatic. Denies fevers chills nausea vomiting syncope shortness of breath prior to the event today..   Patient's recorded medical, surgical, social, medication list and allergies were reviewed in the Snapshot window as part of the initial history.   Review of Systems   Review of Systems  Constitutional:  Negative for chills and fever.  HENT:  Negative for ear pain and sore throat.   Eyes:  Negative for pain and visual disturbance.  Respiratory:  Positive for cough, shortness of breath and wheezing.   Cardiovascular:  Negative for chest pain and palpitations.  Gastrointestinal:  Negative for abdominal pain and vomiting.  Genitourinary:  Negative for dysuria and hematuria.  Musculoskeletal:  Negative for arthralgias and back pain.  Skin:  Negative for color change and rash.  Neurological:  Negative for seizures and syncope.  All other systems reviewed and are negative.   Physical Exam Updated Vital Signs BP 112/65   Pulse 100   Resp (!) 25   LMP 12/16/2022   SpO2 96%  Physical Exam Vitals and nursing note reviewed.  Constitutional:      General: She is not in acute distress.    Appearance: She is well-developed. She is ill-appearing.  HENT:     Head: Normocephalic and atraumatic.  Eyes:     Conjunctiva/sclera: Conjunctivae normal.  Cardiovascular:     Rate and Rhythm: Regular rhythm. Tachycardia present.     Heart sounds: No murmur heard. Pulmonary:     Effort: Pulmonary effort is normal. No respiratory distress.     Breath  sounds: Wheezing present.  Abdominal:     Palpations: Abdomen is soft.     Tenderness: There is no abdominal tenderness.  Musculoskeletal:        General: No swelling.     Cervical back: Neck supple.  Skin:    General: Skin is warm and dry.     Capillary Refill: Capillary refill takes less than 2 seconds.  Neurological:     Mental Status: She is alert.  Psychiatric:        Mood and Affect: Mood normal.      ED Course/ Medical Decision Making/ A&P    Procedures .Critical Care  Performed by: Glyn Ade, MD Authorized by: Glyn Ade, MD   Critical care provider statement:    Critical care time (minutes):  30   Critical care was necessary to treat or prevent imminent or life-threatening deterioration of the following conditions:  Respiratory failure   Critical care was time spent personally by me on the following activities:  Development of treatment plan with patient or surrogate, discussions with consultants, evaluation of patient's response to treatment, examination of patient, ordering and review of laboratory studies, ordering and review of radiographic studies, ordering and performing treatments and interventions, pulse oximetry, re-evaluation of patient's condition and review of old charts    Medications Ordered in ED Medications  EPINEPHrine (EPI-PEN) injection 0.3 mg (0.3 mg Intramuscular Given 04/27/23 1512)  dexamethasone (DECADRON) injection 10 mg (10 mg Intravenous Given 04/27/23 1514)  diphenhydrAMINE (BENADRYL) injection  50 mg (50 mg Intravenous Given 04/27/23 1513)  famotidine (PEPCID) IVPB 20 mg premix (0 mg Intravenous Stopped 04/27/23 1549)  ipratropium-albuterol (DUONEB) 0.5-2.5 (3) MG/3ML nebulizer solution 3 mL (3 mLs Nebulization Given 04/27/23 1517)    Medical Decision Making:   I was immediately called to bedside for respiratory distress in the setting of anaphylaxis.  Treat with epinephrine, albuterol, IV Benadryl, dexamethasone,  famotidine for stabilization.  Will evaluate closely observed for 4 hours in ER for symptomatic improvement resolution/need for further intervention.   Reassessment:  Symptoms resolved on symptomatic reassessment.  Observed for 4 hours in emergency room back to baseline.  She feels comfortable with outpatient care management.  Recommended continued as needed Benadryl follow-up with PCP.  Will refrain from steroid administration due to MFM recommendations during her pregnancy.  Disposition:  I have considered need for hospitalization, however, considering all of the above, I believe this patient is stable for discharge at this time.  Patient/family educated about specific return precautions for given chief complaint and symptoms.  Patient/family educated about follow-up with PCP.     Patient/family expressed understanding of return precautions and need for follow-up. Patient spoken to regarding all imaging and laboratory results and appropriate follow up for these results. All education provided in verbal form with additional information in written form. Time was allowed for answering of patient questions. Patient discharged.    Emergency Department Medication Summary:   Medications  EPINEPHrine (EPI-PEN) injection 0.3 mg (0.3 mg Intramuscular Given 04/27/23 1512)  dexamethasone (DECADRON) injection 10 mg (10 mg Intravenous Given 04/27/23 1514)  diphenhydrAMINE (BENADRYL) injection 50 mg (50 mg Intravenous Given 04/27/23 1513)  famotidine (PEPCID) IVPB 20 mg premix (0 mg Intravenous Stopped 04/27/23 1549)  ipratropium-albuterol (DUONEB) 0.5-2.5 (3) MG/3ML nebulizer solution 3 mL (3 mLs Nebulization Given 04/27/23 1517)    Clinical Impression:  1. Anaphylaxis, initial encounter      Discharge   Final Clinical Impression(s) / ED Diagnoses Final diagnoses:  Anaphylaxis, initial encounter    Rx / DC Orders ED Discharge Orders          Ordered    EPINEPHrine 0.3 mg/0.3 mL IJ SOAJ  injection  As needed        04/27/23 Threasa Beards, MD 04/27/23 1840

## 2023-04-27 NOTE — ED Triage Notes (Signed)
Allergic reaction after drinking lemonade. Self administered epi pen around 2:45pm Wheezing and sob during triage. Able to talk in short sentences  11 W pregnant

## 2023-04-27 NOTE — ED Triage Notes (Signed)
Pt to triage c/o Hyperglycemia. Pt states she is 19W pregnant and is being treated by OB for gestational Diabetes. Current BG 185. VSS NAD PT on room air. PT was seen earlier for Allergic reaction and has received a total of 2 Epi pens in last 12 hours.

## 2023-04-28 ENCOUNTER — Encounter (HOSPITAL_COMMUNITY): Payer: Self-pay

## 2023-04-28 ENCOUNTER — Emergency Department (HOSPITAL_COMMUNITY)
Admission: EM | Admit: 2023-04-28 | Discharge: 2023-04-28 | Disposition: A | Discharge status: Home / Self Care | Payer: Medicaid Other | Attending: Emergency Medicine | Admitting: Emergency Medicine

## 2023-04-28 ENCOUNTER — Other Ambulatory Visit: Payer: Self-pay

## 2023-04-28 ENCOUNTER — Other Ambulatory Visit (HOSPITAL_BASED_OUTPATIENT_CLINIC_OR_DEPARTMENT_OTHER): Payer: Self-pay

## 2023-04-28 DIAGNOSIS — R0600 Dyspnea, unspecified: Secondary | ICD-10-CM | POA: Diagnosis not present

## 2023-04-28 DIAGNOSIS — J45909 Unspecified asthma, uncomplicated: Secondary | ICD-10-CM | POA: Insufficient documentation

## 2023-04-28 DIAGNOSIS — R0602 Shortness of breath: Secondary | ICD-10-CM | POA: Insufficient documentation

## 2023-04-28 DIAGNOSIS — E119 Type 2 diabetes mellitus without complications: Secondary | ICD-10-CM | POA: Insufficient documentation

## 2023-04-28 DIAGNOSIS — Z794 Long term (current) use of insulin: Secondary | ICD-10-CM | POA: Diagnosis not present

## 2023-04-28 DIAGNOSIS — I10 Essential (primary) hypertension: Secondary | ICD-10-CM | POA: Diagnosis not present

## 2023-04-28 DIAGNOSIS — Z87891 Personal history of nicotine dependence: Secondary | ICD-10-CM | POA: Diagnosis not present

## 2023-04-28 DIAGNOSIS — Z7951 Long term (current) use of inhaled steroids: Secondary | ICD-10-CM | POA: Diagnosis not present

## 2023-04-28 DIAGNOSIS — Z79899 Other long term (current) drug therapy: Secondary | ICD-10-CM | POA: Diagnosis not present

## 2023-04-28 LAB — COMPREHENSIVE METABOLIC PANEL
ALT: 22 U/L (ref 0–44)
AST: 30 U/L (ref 15–41)
Albumin: 3.1 g/dL — ABNORMAL LOW (ref 3.5–5.0)
Alkaline Phosphatase: 54 U/L (ref 38–126)
Anion gap: 16 — ABNORMAL HIGH (ref 5–15)
BUN: 6 mg/dL (ref 6–20)
CO2: 16 mmol/L — ABNORMAL LOW (ref 22–32)
Calcium: 9.6 mg/dL (ref 8.9–10.3)
Chloride: 104 mmol/L (ref 98–111)
Creatinine, Ser: 0.65 mg/dL (ref 0.44–1.00)
GFR, Estimated: >60 mL/min (ref >60)
Glucose, Bld: 152 mg/dL — ABNORMAL HIGH (ref 70–99)
Potassium: 4.1 mmol/L (ref 3.5–5.1)
Sodium: 136 mmol/L (ref 135–145)
Total Bilirubin: 0.9 mg/dL (ref 0.3–1.2)
Total Protein: 6.3 g/dL — ABNORMAL LOW (ref 6.5–8.1)

## 2023-04-28 LAB — CBC
HCT: 33.6 % — ABNORMAL LOW (ref 36.0–46.0)
Hemoglobin: 11.1 g/dL — ABNORMAL LOW (ref 12.0–15.0)
MCH: 30.3 pg (ref 26.0–34.0)
MCHC: 33 g/dL (ref 30.0–36.0)
MCV: 91.8 fL (ref 80.0–100.0)
Platelets: 341 10*3/uL (ref 150–400)
RBC: 3.66 MIL/uL — ABNORMAL LOW (ref 3.87–5.11)
RDW: 13.5 % (ref 11.5–15.5)
WBC: 21.4 10*3/uL — ABNORMAL HIGH (ref 4.0–10.5)
nRBC: 0 % (ref 0.0–0.2)

## 2023-04-28 LAB — CBG MONITORING, ED
Glucose-Capillary: 232 mg/dL — ABNORMAL HIGH (ref 70–99)
Glucose-Capillary: 206 mg/dL — ABNORMAL HIGH (ref 70–99)
Glucose-Capillary: 237 mg/dL — ABNORMAL HIGH (ref 70–99)

## 2023-04-28 LAB — LACTIC ACID, PLASMA: Lactic Acid, Venous: 4 mmol/L (ref 0.5–1.9)

## 2023-04-28 MED ORDER — INSULIN ASPART 100 UNIT/ML IJ SOLN
0.0000 [IU] | INTRAMUSCULAR | Status: DC
  Administered 2023-04-28 (×2): 5 [IU] via SUBCUTANEOUS

## 2023-04-28 MED ORDER — LABETALOL HCL 100 MG PO TABS
100.0000 mg | ORAL_TABLET | Freq: Once | ORAL | Status: AC
  Administered 2023-04-28: 100 mg via ORAL
  Filled 2023-04-28: qty 1

## 2023-04-28 MED ORDER — CLONIDINE HCL 0.1 MG PO TABS
0.1000 mg | ORAL_TABLET | Freq: Once | ORAL | Status: AC
  Administered 2023-04-28: 0.1 mg via ORAL
  Filled 2023-04-28: qty 1

## 2023-04-28 MED ORDER — LABETALOL HCL 200 MG PO TABS
100.0000 mg | ORAL_TABLET | Freq: Two times a day (BID) | ORAL | Status: DC
  Administered 2023-04-28: 100 mg via ORAL
  Filled 2023-04-28: qty 1

## 2023-04-28 MED ORDER — LEVOTHYROXINE SODIUM 25 MCG PO TABS: 25.0000 ug | ORAL_TABLET | ORAL | Status: DC

## 2023-04-28 MED ORDER — LACTATED RINGERS IV BOLUS
1000.0000 mL | Freq: Once | INTRAVENOUS | Status: AC
  Administered 2023-04-28: 1000 mL via INTRAVENOUS

## 2023-04-28 MED ORDER — EPINEPHRINE HCL 5 MG/250ML IV SOLN IN NS
0.5000 ug/min | INTRAVENOUS | Status: DC
Start: 2023-04-28 — End: 2023-04-28
  Administered 2023-04-28: 3 ug/min via INTRAVENOUS
  Filled 2023-04-28: qty 250

## 2023-04-28 MED ORDER — ALPRAZOLAM 0.25 MG PO TABS
1.0000 mg | ORAL_TABLET | Freq: Three times a day (TID) | ORAL | Status: DC | PRN
  Administered 2023-04-28: 1 mg via ORAL
  Filled 2023-04-28: qty 4

## 2023-04-28 MED ORDER — INSULIN ASPART 100 UNIT/ML IJ SOLN
4.0000 [IU] | Freq: Once | INTRAMUSCULAR | Status: AC
  Administered 2023-04-28: 4 [IU] via SUBCUTANEOUS

## 2023-04-28 MED ORDER — EPINEPHRINE 0.3 MG/0.3ML IJ SOAJ
0.3000 mg | INTRAMUSCULAR | 1 refills | Status: AC | PRN
  Filled 2023-04-28: qty 2, 1d supply, fill #0
  Filled 2023-04-30: qty 2, 30d supply, fill #0

## 2023-04-28 MED ORDER — EPINEPHRINE 0.3 MG/0.3ML IJ SOAJ
INTRAMUSCULAR | Status: AC
  Administered 2023-04-28: 0.3 mg via INTRAMUSCULAR
  Filled 2023-04-28: qty 0.3

## 2023-04-28 MED ORDER — ACETAMINOPHEN 500 MG PO TABS
1000.0000 mg | ORAL_TABLET | Freq: Once | ORAL | Status: AC
  Administered 2023-04-28: 1000 mg via ORAL
  Filled 2023-04-28: qty 2

## 2023-04-28 MED ORDER — LEVOTHYROXINE SODIUM 25 MCG PO TABS
25.0000 ug | ORAL_TABLET | ORAL | Status: DC
Start: 2023-04-28 — End: 2023-04-28

## 2023-04-28 MED ORDER — EPINEPHRINE 0.3 MG/0.3ML IJ SOAJ
0.3000 mg | Freq: Once | INTRAMUSCULAR | Status: AC
  Filled 2023-04-28: qty 0.3

## 2023-04-28 MED ORDER — LEVOTHYROXINE SODIUM 25 MCG PO TABS: 50.0000 ug | ORAL_TABLET | ORAL | Status: DC

## 2023-04-28 MED ORDER — LEVOTHYROXINE SODIUM 25 MCG PO TABS
25.0000 ug | ORAL_TABLET | Freq: Once | ORAL | Status: AC
  Administered 2023-04-28: 25 ug via ORAL
  Filled 2023-04-28: qty 1

## 2023-04-28 MED ORDER — ALBUTEROL SULFATE (2.5 MG/3ML) 0.083% IN NEBU
10.0000 mg/h | INHALATION_SOLUTION | Freq: Once | RESPIRATORY_TRACT | Status: DC
Start: 2023-04-28 — End: 2023-04-28
  Filled 2023-04-28: qty 12

## 2023-04-28 MED ORDER — RACEPINEPHRINE HCL 2.25 % IN NEBU
0.5000 mL | INHALATION_SOLUTION | Freq: Once | RESPIRATORY_TRACT | Status: AC
  Administered 2023-04-28: 0.5 mL via RESPIRATORY_TRACT
  Filled 2023-04-28: qty 0.5

## 2023-04-28 MED ORDER — ALBUTEROL SULFATE (2.5 MG/3ML) 0.083% IN NEBU
INHALATION_SOLUTION | RESPIRATORY_TRACT | Status: AC
  Filled 2023-04-28: qty 30

## 2023-04-28 NOTE — ED Notes (Signed)
Walked into pt's room and pt was tachy at 154, pt was having labored breathing, audible wheezes and stridor. I placed pt on 15 L 02 per NRB and had my nursing student get the doctor from [place] zone. Dr Durwin Nora came to bedside right away and placed orders. I obtained the meds and called RT and gave the pt the meds ordered.

## 2023-04-28 NOTE — ED Notes (Signed)
FRH 162

## 2023-04-28 NOTE — ED Notes (Signed)
Pt's glucose was 206. The glucometer is not crossing over
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
## 2023-04-28 NOTE — ED Provider Notes (Signed)
Handoff received from prior provider.  Patient presenting with her third visit in the last 24 hours for concern for anaphylaxis.  Reports sensitivity to soy.  This is now her third visit after presenting for stridor, shortness of breath, tachycardia.  Was reportedly hypotensive with EMS.  She has received multiple rounds of epinephrine, steroids, Benadryl over the last 24 hours.  Received epinephrine with EMS.  Symptoms improved with prior provider.  Symptoms were reportedly somewhat inconsistent.  However on my examination she is persistently tachypneic, tachycardic.  She has no stridor and normal work of breathing otherwise.  I do not see any signs of persistent anaphylaxis.  Her lab appears normal for Singh for leukocytosis likely related to steroids and epinephrine, as well as anion gap metabolic acidosis likely related to epinephrine as well.  I do not see signs of sepsis.  She is hyperglycemic and is very worried if she has had worsening blood sugar this week, likely worsened by steroids.  She is on long-acting insulin but not short acting insulin at home and follows with MFM at [facility]  I discussed with her there is concern for her continued abnormal vital signs and multiple presentations in the last 24 hours for concern for severe allergic reaction.  After lengthy discussion she was amenable to observation in the hospital.  I added on IV fluids, sliding-scale insulin.  Notified by Dr. Durwin Nora that patient had recurrent stridor and difficulty breathing here.  Reportedly was stridulous on exam, got 2 doses of IM epinephrine and started on epinephrine drip by Dr. Durwin Nora.  On my evaluation she is on BiPAP, symptoms are improving, I do not hear any additional stridor.  Critical care has been paged for admission.  Patient was evaluated by critical care.  There were more suspicious for possible reflux or vocal cord dysfunction.  They took the patient off of BiPAP and epinephrine drip and recommended observation.  I  reevaluated the patient.  She has no stridor, she is still tachycardic.  I recommended that she come into the hospital for observation under general medicine service given concern for recurrent anaphylaxis and risk for decompensation including airway compromise.  Patient would like to leave AGAINST MEDICAL ADVICE.  I had a lengthy discussion with her and family bedside.  I discussed with them that I was concerned about risk for decompensation and recurrent anaphylaxis symptoms.  I stated that I recommended she stay in the hospital for further observation, monitoring and repeat lab work given existing lab abnormalities as well as her hyperglycemia likely worsened in setting of pregnancy with steroids.  I discussed risks including decompensation, difficulty breathing, respiratory stress, potential permanent disability or death as well as potential harm to her fetus or pregnancy complications from leaving AGAINST MEDICAL ADVICE.  She was persistent and was able voiced understanding of the risks and benefits and elected to leave AGAINST MEDICAL ADVICE.  I did prescribe her morning medications.  She was given insulin here for blood sugar control.  She is already talked with her maternal-fetal medicine doctor and plans to follow-up with them.  I gave her prescription for epinephrine as well at home.  I encouraged her to return if she changes her mind or has any recurrent symptoms.  She left the hospital AGAINST MEDICAL ADVICE.   Laurence Spates, MD 04/28/23 [phone]

## 2023-04-28 NOTE — ED Triage Notes (Addendum)
Pt BIBEMS from home w/ c/o allergic reaction, 2nd in 24hrs. Pt seen at [facility] earlier today for same. As per pt current started after having fresh squeeze lemonade. Pt endorse [redacted] weeks pregnant

## 2023-04-28 NOTE — ED Notes (Signed)
CBG was 232.

## 2023-04-28 NOTE — ED Notes (Addendum)
Pt leaving AMA due to not feeling confident in care she is receiving, pt reports she commonly rebounds from anaphylaxis with epi or racemic epi, provider wanted to monitor; racemic epi ordered and administered wit immediate improvement, provider wanted to admit pt, pt reports she is able to breathe and wants to leave AMA and present to another facility that she feels she is getting better care; pt reports she would like to file a complaint; phone number provided; CN in room during interactions

## 2023-04-28 NOTE — ED Notes (Addendum)
Pt directed to lay on left side as she reports feeling decreased fetal movement, water given

## 2023-04-28 NOTE — ED Provider Notes (Signed)
EMERGENCY DEPARTMENT AT [facility] Provider Note   CSN: [id number] Arrival date & time: 04/27/23  2300     History  Chief Complaint  Patient presents with   Hyperglycemia    Naomi J Arnaud is a 27 y.o. female.  Patient is a 27 year old female with past medical history of depression, anxiety, bipolar, diabetes.  Patient is currently pregnant at approximately [redacted] weeks gestation.  Patient presenting today for evaluation of elevated blood sugar.  She was here several hours ago complaining of some sort of allergic reaction for which she had received epinephrine and steroids.  At home this evening she checked her blood sugar and it was over 200.  She then called her OB/GYN who instructed her to come to the emergency room immediately for "fast acting insulin".  Patient describes feeling anxious and as if her heart is racing.  She denies any abdominal pain or bleeding.  She does report decreased fetal movement, however fetal heart tones are 160 here in the ER.  The history is provided by the patient.       Home Medications Prior to Admission medications   Medication Sig Start Date End Date Taking? Authorizing Provider  albuterol (PROVENTIL) (2.5 MG/3ML) 0.083% nebulizer solution Take 3 mLs (2.5 mg total) by nebulization every 6 (six) hours as needed for wheezing or shortness of breath. 01/15/23   Venora Maples, MD  albuterol (VENTOLIN HFA) 108 (90 Base) MCG/ACT inhaler Inhale 1-2 puffs into the lungs every 4 (four) hours as needed for wheezing or shortness of breath. 01/15/23   Venora Maples, MD  cefadroxil (DURICEF) 500 MG capsule Take 1 capsule (500 mg total) by mouth 2 (two) times daily. 01/27/23   Raelyn Mora, CNM  EPINEPHrine 0.3 mg/0.3 mL IJ SOAJ injection Inject 0.3 mg into the muscle as needed for anaphylaxis. 04/23/23   Sabas Sous, MD  EPINEPHrine 0.3 mg/0.3 mL IJ SOAJ injection Inject 0.3 mg into the muscle as needed for anaphylaxis. 04/27/23    Glyn Ade, MD  insulin detemir (LEVEMIR) 100 UNIT/ML injection Inject 20 Units into the skin daily.    [provider]  labetalol (NORMODYNE) 100 MG tablet Take 1 tablet (100 mg total) by mouth 2 (two) times daily. 01/17/23   Rasch, Victorino Dike I, NP  levothyroxine (SYNTHROID) 25 MCG tablet Take 25 mcg by mouth daily before breakfast.    [provider]  metFORMIN (GLUCOPHAGE) 500 MG tablet Take 500 mg by mouth 2 (two) times daily with a meal.    [provider]  ondansetron (ZOFRAN-ODT) 4 MG disintegrating tablet Take 1 tablet (4 mg total) by mouth every 8 (eight) hours as needed for nausea or vomiting. 03/20/23   Carlynn Herald, CNM  Prenatal Vit-Fe Fumarate-FA (PRENATAL VITAMIN PO) Take 1 tablet by mouth daily at 6 (six) AM.    [provider]  promethazine (PHENERGAN) 25 MG suppository Place 1 suppository (25 mg total) rectally every 6 (six) hours as needed for nausea or vomiting. 01/15/23   Venora Maples, MD  promethazine (PHENERGAN) 25 MG tablet Take 1 tablet (25 mg total) by mouth every 6 (six) hours as needed for nausea or vomiting. 01/15/23   Venora Maples, MD  scopolamine (TRANSDERM-SCOP) 1 MG/3DAYS Place 1 patch (1.5 mg total) onto the skin every 3 (three) days. 03/20/23   Carlynn Herald, CNM  SYMBICORT 160-4.5 MCG/ACT inhaler Inhale 2 puffs into the lungs in the morning and at bedtime. 09/12/22   [provider]      Allergies    Soy allergy and Other    Review of Systems   Review of Systems  All other systems reviewed and are negative.   Physical Exam Updated Vital Signs BP 124/85   Pulse (!) 160   Temp 98.5 F (36.9 C) (Oral)   Resp 18   Wt 104.3 kg   LMP 12/16/2022   SpO2 99%   BMI 42.06 kg/m  Physical Exam Vitals and nursing note reviewed.  Constitutional:      General: She is not in acute distress.    Appearance: She is well-developed. She is not diaphoretic.  HENT:     Head: Normocephalic and  atraumatic.  Cardiovascular:     Rate and Rhythm: Regular rhythm. Tachycardia present.     Heart sounds: No murmur heard.    No friction rub. No gallop.  Pulmonary:     Effort: Pulmonary effort is normal. No respiratory distress.     Breath sounds: Normal breath sounds. No wheezing.  Abdominal:     General: Bowel sounds are normal. There is no distension.     Palpations: Abdomen is soft.     Tenderness: There is no abdominal tenderness.  Musculoskeletal:        General: Normal range of motion.     Cervical back: Normal range of motion and neck supple.  Skin:    General: Skin is warm and dry.  Neurological:     General: No focal deficit present.     Mental Status: She is alert and oriented to person, place, and time.     ED Results / Procedures / Treatments   Labs (all labs ordered are listed, but only abnormal results are displayed) Labs Reviewed  CBG MONITORING, ED - Abnormal; Notable for the following components:      Result Value   Glucose-Capillary 185 (*)    All other components within normal limits    EKG None  Radiology No results found.  Procedures Procedures    Medications Ordered in ED Medications  insulin regular (NOVOLIN R) 100 units/mL injection 4 Units (has no administration in time range)  labetalol (NORMODYNE) tablet 100 mg (has no administration in time range)  cloNIDine (CATAPRES) tablet 0.1 mg (has no administration in time range)    ED Course/ Medical Decision Making/ A&P  Patient is a 27 year old female currently pregnant at approximately [redacted] weeks gestation.  She has past medical history of bipolar disorder, anxiety, depression, diabetes, and multiple failed pregnancies.  She presents today with complaints of elevated blood sugar.  She sees high risk OB at [facility] and was advised to come here for "regulation of her blood sugar".  Patient was also seen here earlier today for what was described as "anaphylaxis".  She had received IV steroids along  with 2 doses of an EpiPen.  This evening she checked her blood sugar and it was 200 prompting her to call her OB/GYN.  She was told to come here for "fast acting insulin".  Patient arrived here with stable vital signs and was afebrile.  She was tachycardic upon presentation, but does have a history of tachycardia and seemed extremely anxious and agitated from the onset of our encounter.  Her initial blood sugar here was 185, and patient become angry and offended when I told her that 185 was not significantly elevated in the setting of receiving IV steroids and epinephrine.  My original plan was to simply monitor her sugars, but agreed  to administer a subcutaneous dose of insulin when she became upset and told me her OBGYN insisted upon it.  She received the insulin. I also gave doses of clonidine and labetolol as she missed these medications this evening due to coming here.  While waiting for the insulin to take effect, I was informed that she had become stridorous and was having difficulty breathing.  I immediately returned to the room to find the patient hyperventilating and making upper airway stridor-like noises.  I auscultated her lungs and noted that there was no wheezing or transmitted upper airway sounds.  I then inspected her mouth and throat and saw no evidence of narrowing or swelling.  Patient informed me that when this happens she requires nebulized epinephrine which I agreed to administer.  Shortly after she began receiving this medication, I returned to the room to inform her that I was most comfortable with her being admitted to the hospital.  Patient informed me that she was not staying and she refused to be admitted.  Patient then decided to sign out AGAINST MEDICAL ADVICE.  During this encounter, this patient was very antagonistic towards myself and the nursing staff, and seemed agitated and angry from the moment I entered her exam room.  Patient also became very angry when I did not  intervene immediately with treatments I felt were unnecessary when she was making her stridorous sounds.  During this episode, she spoke in full sentences while having the ability to make derogatory remarks about me, the nursing staff, and her care.  Also, her stridorous breathing came and went while she was speaking and oxygen saturations never dropped below 97%..    As this was her second visit today for these episodes, I decided the safest disposition would be to pursue admission with the hospitalist.  When I informed her that I had planned to admit her, she became even more angry, then signed out AMA even though she insisted on several occasions that she was in "anaphylactic shock".  Patient's oxygen saturations were always in the upper 90s on room air and her blood pressures were normal despite her being tachycardic.  I feel as though the tachycardia was related to her agitated state and missed dose of labetolol.  Cause of the "stridor" unclear but I suspect is mainly anxiety-related with a component of factitious behavior.  As she left the department, she inquired about how to file a complaint about me and the nursing staff that she says "yelled at her".    Also, fetal heart tones were obtained during this visit and were in the 160s.  Final Clinical Impression(s) / ED Diagnoses Final diagnoses:  None    Rx / DC Orders ED Discharge Orders     None         Geoffery Lyons, MD 04/28/23 [phone]

## 2023-04-28 NOTE — ED Notes (Signed)
Pt refusing to keep bipap on

## 2023-04-28 NOTE — Consult Note (Signed)
NAME:  Carol Paul, MRN:  [id number], DOB:  05/03/1996, LOS: 0 ADMISSION DATE:  04/28/2023, CONSULTATION DATE:  04/28/23 REFERRING MD:  Dr. Earlene Plater, CHIEF COMPLAINT:  SOB/ stridor  History of Present Illness:   28 yoF with PMH asthma, anxiety, DM, HTN, hyperthyroidism who is currently G2P1 at 20 weeks followed at [facility] for her high risk pregnancy in which PCCM consulted for recurrent stridor and respiratory distress due to suspected rebound anaphylaxis.  Seen in ER on 10/16 for subjective rash, hoarseness, SOB, and sensation of throat closing after eating soy, improved after home epi pen discharged home after no return of symptoms.  Presented again 10/20 with wheezing and SOB after drinking lemonade s/p taking epi pen.  Did have ongoing respiratory distress treated with epi, albuterol, IV benadryl, dexamethasone, pepcid with improvement discharged home after observation period (steroid taper not recommended per her OB/ GYN).  Returned back later that evening with hyperglycemia complaints based on recommendations from her MFM for management. No reported fever, chills, sputum, N/V.  While in ER, she developed SOB, hyperventilating, and became stridorous without any noted wheezing, transmitted upper airway sounds or evidence of oral edema.  Treated with nebulized epi.  No desaturations or hypotension noted but remained tachycardic which patient has some baseline of, apparently to be followed by cardiology outpt. After concern this was related to anxiety, pt left AMA 10/21 ~ 2am.  She then returned via EMS after 0500 with SOB and concern for recurrent anaphylaxis, given epi w/ EMS.  Observed again in ER, but then developed recurrent respiratory distress and audible wheezing but no evidence of desaturation or edema.  She was treated with IM epi and placed on epi gtt and BiPAP for work of breathing.  Pt since took herself off BiPAP and is feeling better on epi drip at 3 mcg/ min.  PCCM consulted for possible ICU  admit.   Currently pt states she is feeling better, no current SOB, no airway tightness.    Pertinent  Medical History   Past Medical History:  Diagnosis Date   Anxiety    Asthma    Diabetes mellitus without complication (HCC)    Hypertension    Hyperthyroidism    Ovarian cyst    UTI (urinary tract infection)     Significant Hospital Events: Including procedures, antibiotic start and stop dates in addition to other pertinent events     Interim History / Subjective:   Objective   Blood pressure 110/61, pulse (!) 125, temperature 98.8 F (37.1 C), temperature source Axillary, resp. rate (!) 24, height 5\' 2"  (1.575 m), weight 104.3 kg, last menstrual period 12/16/2022, SpO2 100%, unknown if currently breastfeeding.    Vent Mode: PSV;BIPAP FiO2 (%):  [40 %] 40 % PEEP:  [5 cmH20] 5 cmH20 Pressure Support:  [5 cmH20] 5 cmH20   Intake/Output Summary (Last 24 hours) at 04/28/2023 1000 Last data filed at 04/28/2023 0946 Gross per 24 hour  Intake 1108.27 ml  Output --  Net 1108.27 ml   Filed Weights   04/28/23 0535  Weight: 104.3 kg   Examination: General:  Anxious appearing young adult sitting upright in bed.  HEENT: MM pink/moist, no evidence of airway swelling, uvula midline, faint upper airway prolonged exp wheeze, phonation normal, no hoarseness  Neuro: Aox4, MAE CV: rr, ST 120's PULM:  non labored, clear, RA  Skin: no rashes or lesions   Resolved Hospital Problem list    Assessment & Plan:   Dyspnea w/concern for recurrent/ rebound  anaphylaxis vs VCD Lactic acidosis   - took herself off BiPAP> normal WOB and on room air currently with normal O2 saturations.  - stop epi gtt. MAPs have been stable.  Monitor hemodynamics and airway.  Has baseline tachycardia at baseline (to be f/u with cardiology outpt).  Cont observation, TRH to admit to observation overnight vs extended ER monitoring, no need for ICU monitoring as no evidence of hypoxia, hypotension, objective rash  during episodes.  Overall, suspicious for VCD.    Would need ENT evaluation if recurrent/ ongoing upper airway complaints. - repeat lactic in 2 hours.  Likely by product of epi, albuterol, and WOB (resolved).  Has not been hypotensive.  - cont pta pepcid treatment for GERD, may be contributing  - hold further steroids  - check IgE.  Multiple allergens, pt suspects soy triggered this> currently not seeing allergist while getting treated for hyperemesis gravidarum at [facility].  Needs outpt f/u when able - hx of Asthma but no acute exacerbation suspected, cont symbicort   Remainder per TRH.  PCCM available as needed.    Anxiety Asthma  HTN Pregnant, currently 20 weeks, G2P1 - followed at [facility] DMT2   Labs   CBC: Recent Labs  Lab 04/28/23 0549  WBC 21.4*  HGB 11.1*  HCT 33.6*  MCV 91.8  PLT 341    Basic Metabolic Panel: Recent Labs  Lab 04/28/23 0549  NA 136  K 4.1  CL 104  CO2 16*  GLUCOSE 152*  BUN 6  CREATININE 0.65  CALCIUM 9.6   GFR: Estimated Creatinine Clearance: 121.8 mL/min (by C-G formula based on SCr of 0.65 mg/dL). Recent Labs  Lab 04/28/23 0549 04/28/23 0900  WBC 21.4*  --   LATICACIDVEN  --  4.0*    Liver Function Tests: Recent Labs  Lab 04/28/23 0549  AST 30  ALT 22  ALKPHOS 54  BILITOT 0.9  PROT 6.3*  ALBUMIN 3.1*   No results for input(s): "LIPASE", "AMYLASE" in the last 168 hours. No results for input(s): "AMMONIA" in the last 168 hours.  ABG    Component Value Date/Time   HCO3 19.1 (L) 12/02/2022 0132   TCO2 20 (L) 12/02/2022 0132   ACIDBASEDEF 4.0 (H) 12/02/2022 0132   O2SAT 97 12/02/2022 0132     Coagulation Profile: No results for input(s): "INR", "PROTIME" in the last 168 hours.  Cardiac Enzymes: No results for input(s): "CKTOTAL", "CKMB", "CKMBINDEX", "TROPONINI" in the last 168 hours.  HbA1C: Hgb A1c MFr Bld  Date/Time Value Ref Range Status  01/15/2023 09:20 AM 6.1 (H) 4.8 - 5.6 % Final    Comment:    (NOTE) Pre  diabetes:          5.7%-6.4%  Diabetes:              >6.4%  Glycemic control for   <7.0% adults with diabetes   12/02/2022 04:30 AM 6.0 (H) 4.8 - 5.6 % Final    Comment:    (NOTE)         Prediabetes: 5.7 - 6.4         Diabetes: >6.4         Glycemic control for adults with diabetes: <7.0     CBG: Recent Labs  Lab 04/27/23 2311 04/28/23 0719 04/28/23 0901  GLUCAP 185* 232* 206*    Review of Systems:   As per HPI   Past Medical History:  She,  has a past medical history of Anxiety, Asthma, Diabetes mellitus without complication (  HCC), Hypertension, Hyperthyroidism, Ovarian cyst, and UTI (urinary tract infection).   Surgical History:   Past Surgical History:  Procedure Laterality Date   ANKLE SURGERY Left    TONSILLECTOMY       Social History:   reports that she has quit smoking. She has been exposed to tobacco smoke. She has never used smokeless tobacco. She reports that she does not currently use alcohol. She reports that she does not currently use drugs after having used the following drugs: Marijuana.   Family History:  Her family history includes CAD in an other family member; Healthy in her mother; Hypothyroidism in her father.   Allergies Allergies  Allergen Reactions   Sesame Seed (Diagnostic) Anaphylaxis   Soy Allergy Anaphylaxis   Soybean Oil Anaphylaxis   Egg-Derived Products Diarrhea, Nausea Only and Other (See Comments)    GI discomfort   Milk (Cow) Diarrhea, Nausea Only and Other (See Comments)    GI discomfort   Wheat Diarrhea, Nausea Only and Other (See Comments)    GI discomfort     Home Medications  Prior to Admission medications   Medication Sig Start Date End Date Taking? Authorizing Provider  albuterol (PROVENTIL) (2.5 MG/3ML) 0.083% nebulizer solution Take 3 mLs (2.5 mg total) by nebulization every 6 (six) hours as needed for wheezing or shortness of breath. 01/15/23  Yes Venora Maples, MD  albuterol (VENTOLIN HFA) 108 (90 Base)  MCG/ACT inhaler Inhale 1-2 puffs into the lungs every 4 (four) hours as needed for wheezing or shortness of breath. 01/15/23  Yes Venora Maples, MD  alprazolam Prudy Feeler) 2 MG tablet Take 1 mg by mouth every 8 (eight) hours as needed for anxiety.   Yes [provider]  cloNIDine (CATAPRES) 0.1 MG tablet Take 0.1 mg by mouth at bedtime.   Yes [provider]  diphenhydrAMINE (BENADRYL) 25 MG tablet Take 50 mg by mouth daily as needed (following anaphylactic reaction).   Yes [provider]  EPINEPHrine 0.3 mg/0.3 mL IJ SOAJ injection Inject 0.3 mg into the muscle as needed for anaphylaxis. 04/27/23  Yes Glyn Ade, MD  famotidine (PEPCID) 20 MG tablet Take 20 mg by mouth 2 (two) times daily.   Yes [provider]  hydrOXYzine (ATARAX) 25 MG tablet Take 25 mg by mouth 3 (three) times daily as needed for anxiety.   Yes [provider]  insulin glargine (LANTUS SOLOSTAR) 100 UNIT/ML Solostar Pen Inject 20 Units into the skin at bedtime.   Yes [provider]  labetalol (NORMODYNE) 100 MG tablet Take 1 tablet (100 mg total) by mouth 2 (two) times daily. 01/17/23  Yes Rasch, Victorino Dike I, NP  levothyroxine (SYNTHROID) 25 MCG tablet Take 25-50 mcg by mouth See admin instructions. Take 25 mcg (1 tablet) once daily before breakfast, except take 50 mcg (2 tablets) before breakfast on Monday and Friday.   Yes [provider]  metoCLOPramide (REGLAN) 10 MG tablet Take 10 mg by mouth 4 (four) times daily as needed for nausea.   Yes [provider]  ondansetron (ZOFRAN) 4 MG tablet Take 4 mg by mouth every 8 (eight) hours as needed for nausea or vomiting.   Yes [provider]  Prenatal Vit-Fe Fumarate-FA (PRENATAL VITAMIN PO) Take 1 tablet by mouth daily.   Yes [provider]  promethazine (PHENERGAN) 25 MG suppository Place 1 suppository (25 mg total) rectally every 6 (six) hours as needed for nausea or vomiting. 01/15/23   Yes Venora Maples, MD  promethazine (PHENERGAN) 25 MG tablet Take 1 tablet (25 mg total) by mouth every 6 (six) hours as needed for nausea or vomiting. 01/15/23  Yes Venora Maples, MD  scopolamine (TRANSDERM-SCOP) 1 MG/3DAYS Place 1 patch (1.5 mg total) onto the skin every 3 (three) days. 03/20/23  Yes Sandra Cockayne M, CNM  SYMBICORT 160-4.5 MCG/ACT inhaler Inhale 2 puffs into the lungs 2 (two) times daily as needed. 09/12/22  Yes [provider]  ondansetron (ZOFRAN-ODT) 4 MG disintegrating tablet Take 1 tablet (4 mg total) by mouth every 8 (eight) hours as needed for nausea or vomiting. Patient not taking: Reported on 04/28/2023 03/20/23   Carlynn Herald, CNM     Critical care time: n/a       Posey Boyer, MSN, AG-ACNP-BC [facility] 04/28/2023, 10:01 AM  See Amion for pager If no response to pager , please call [phone] until 7pm After 7:00 pm call Elink  [phone]

## 2023-04-28 NOTE — ED Provider Notes (Signed)
[facility] Emergency Department Provider Note MRN:  [id number]  Arrival date & time: 04/28/23     Chief Complaint   Shortness of breath History of Present Illness   Carol Paul is a 27 y.o. year-old female with a history of hypertension And diabetes presenting to the ED with chief complaint of shortness of breath.  Patient called EMS out of concern for recurrent anaphylaxis.  Had fresh squeezed lemonade earlier this evening.  This is her third visit to the emergency department this evening.  Initially presented to the emergency department with anaphylaxis symptoms and was treated.  Thinks it came back.  Given epinephrine by EMS.  Review of Systems  A thorough review of systems was obtained and all systems are negative except as noted in the HPI and PMH.   Patient's Health History    Past Medical History:  Diagnosis Date   Anxiety    Asthma    Diabetes mellitus without complication (HCC)    Hypertension    Hyperthyroidism    Ovarian cyst    UTI (urinary tract infection)     Past Surgical History:  Procedure Laterality Date   ANKLE SURGERY Left    TONSILLECTOMY      Family History  Problem Relation Age of Onset   Healthy Mother    Hypothyroidism Father    CAD Other     Social History   Socioeconomic History   Marital status: Married    Spouse name: Lequita Asal   Number of children: Not on file   Years of education: Not on file   Highest education level: Not on file  Occupational History   Not on file  Tobacco Use   Smoking status: Former    Passive exposure: Current   Smokeless tobacco: Never  Vaping Use   Vaping status: Never Used  Substance and Sexual Activity   Alcohol use: Not Currently    Comment: social   Drug use: Not Currently    Types: Marijuana    Comment: daily   Sexual activity: Yes    Birth control/protection: None  Other Topics Concern   Not on file  Social History Narrative   Not on file   Social  Determinants of Health   Financial Resource Strain: Low Risk  (04/09/2023)   Received from [facility]   Overall Financial Resource Strain (CARDIA)    Difficulty of Paying Living Expenses: Not hard at all  Food Insecurity: No Food Insecurity (04/09/2023)   Received from [facility]   Hunger Vital Sign    Worried About Running Out of Food in the Last Year: Never true    Ran Out of Food in the Last Year: Never true  Transportation Needs: No Transportation Needs (04/09/2023)   Received from [facility] - Transportation    In the past 12 months, has lack of transportation kept you from medical appointments or from getting medications?: No    Lack of Transportation (Non-Medical): No  Physical Activity: Insufficiently Active (04/09/2023)   Received from [facility]   Exercise Vital Sign    Days of Exercise per Week: 2 days    Minutes of Exercise per Session: 30 min  Stress: Stress Concern Present (04/09/2023)   Received from [facility] - Occupational Stress Questionnaire    Feeling of Stress : Rather much  Social Connections: Moderately Integrated (04/09/2023)  Received from [facility]   Social Connection and Isolation Panel [NHANES]    Frequency of Communication with Friends and Family: More than three times a week    Frequency of Social Gatherings with Friends and Family: Never    Attends Religious Services: Never    Database administrator or Organizations: Yes    Attends Banker Meetings: Never    Marital Status: Married  Recent Concern: Social Connections - Somewhat Isolated (01/23/2023)   Received from [employer]   Social Network    How would you rate your social network (family, work, friends)?: Restricted participation with some degree of social isolation  Intimate Partner Violence: Not At Risk (01/23/2023)    Received from [facility]   HITS    Over the last 12 months how often did your partner physically hurt you?: 1    Over the last 12 months how often did your partner insult you or talk down to you?: 1    Over the last 12 months how often did your partner threaten you with physical harm?: 1    Over the last 12 months how often did your partner scream or curse at you?: 1     Physical Exam   Vitals:   04/28/23 0600 04/28/23 0615  BP: 114/76 121/72  Pulse: (!) 131 (!) 125  Resp: (!) 21 (!) 26  Temp:    SpO2: 98% 97%    CONSTITUTIONAL: Well-appearing, NAD NEURO/PSYCH:  Alert and oriented x 3, no focal deficits EYES:  eyes equal and reactive ENT/NECK:  no LAD, no JVD CARDIO: Tachycardic rate, well-perfused, normal S1 and S2 PULM:  CTAB no wheezing or rhonchi GI/GU:  non-distended, non-tender MSK/SPINE:  No gross deformities, no edema SKIN:  no rash, atraumatic   *Additional and/or pertinent findings included in MDM below  Diagnostic and Interventional Summary    EKG Interpretation Date/Time:  Monday April 28 2023 05:31:58 EDT Ventricular Rate:  128 PR Interval:  118 QRS Duration:  86 QT Interval:  308 QTC Calculation: 450 R Axis:   46  Text Interpretation: Sinus tachycardia Low voltage, precordial leads Borderline T abnormalities, anterior leads Confirmed by Kennis Carina [id number]) on 04/28/2023 5:56:10 AM       Labs Reviewed  CBC - Abnormal; Notable for the following components:      Result Value   WBC 21.4 (*)    RBC 3.66 (*)    Hemoglobin 11.1 (*)    HCT 33.6 (*)    All other components within normal limits  COMPREHENSIVE METABOLIC PANEL  URINALYSIS, ROUTINE W REFLEX MICROSCOPIC    No orders to display    Medications  acetaminophen (TYLENOL) tablet 1,000 mg (1,000 mg Oral Given 04/28/23 0558)     Procedures  /  Critical Care Ultrasound ED OB Pelvic  Date/Time: 04/28/2023 5:52 AM  Performed by: Sabas Sous, MD Authorized by: Sabas Sous,  MD   Procedure details:    Indications comment:  Cramping, decreased fetal movement   Assess:  Fetal viability   Technique:  Transabdominal obstetric (HCG+) exam   Images: archived   Study Limitations: body habitus Uterine findings:    Intrauterine pregnancy: identified     Single gestation: identified      Comments:     Normal fetal activity and heart rate.   ED Course and Medical Decision Making  Initial Impression and Ddx Patient has had numerous ED visits recently for shortness of breath, stridorous sounds.  Has  self-administered epinephrine at home and has received epinephrine multiple times in the emergency department or from EMS.  There have been a few documented episodes where care providers doubt the presence of true anaphylaxis.  I evaluated the patient last week and found that her upper airway stridorous sounds did not really match her lung exam, which was completely normal.  She also seem to respond to interventions rather immediately as they were being pushed which was a bit odd.  This evening she will exhibit the stridorous sounds but then during conversation they will completely go away and she will have full ability to talk in full sentences without issue.  Suspect a large component of anxiety.  She has also been struggling with unexplained vomiting, unexplained burning with urination.  Will monitor here in the emergency department for a few hours given the epinephrine administered by EMS.  Endorsing some mild lower abdominal cramping and has not really felt her baby move as much today.  Bedside ultrasound done in response to this, reassuring.  Past medical/surgical history that increases complexity of ED encounter: Diabetes  Interpretation of Diagnostics I personally reviewed the EKG and my interpretation is as follows: Sinus rhythm  Labs reveal leukocytosis, likely in the setting of recent doses of steroids.  CMP pending  Patient Reassessment and Ultimate  Disposition/Management     Signed out to oncoming provider, ideally be observed for another hour or 2 given the epinephrine administered by EMS.  Observed for resolution of tachycardia and no return of significant symptoms.  Will discuss my concerns about the etiology of patient's symptoms with the patient to hopefully avoid excessive use of the emergency department, excessive use of these allergy medicines.  Patient management required discussion with the following services or consulting groups:  None  Complexity of Problems Addressed Acute illness or injury that poses threat of life of bodily function  Additional Data Reviewed and Analyzed Further history obtained from: EMS on arrival  Additional Factors Impacting ED Encounter Risk None  Elmer Sow. Pilar Plate, MD [facility] Emergency Medicine [facility] [email or web address]  Final Clinical Impressions(s) / ED Diagnoses     ICD-10-CM   1. SOB (shortness of breath)  R06.02       ED Discharge Orders     None        Discharge Instructions Discussed with and Provided to Patient:   Discharge Instructions   None      Sabas Sous, MD 04/28/23 [phone]

## 2023-04-29 ENCOUNTER — Emergency Department (HOSPITAL_COMMUNITY): Payer: Medicaid Other

## 2023-04-29 ENCOUNTER — Emergency Department (HOSPITAL_COMMUNITY)
Admission: EM | Admit: 2023-04-29 | Discharge: 2023-04-29 | Discharge status: Against Medical Advice | Payer: Medicaid Other | Attending: Emergency Medicine | Admitting: Emergency Medicine

## 2023-04-29 ENCOUNTER — Other Ambulatory Visit: Payer: Self-pay

## 2023-04-29 DIAGNOSIS — Z5329 Procedure and treatment not carried out because of patient's decision for other reasons: Secondary | ICD-10-CM | POA: Insufficient documentation

## 2023-04-29 DIAGNOSIS — R0602 Shortness of breath: Secondary | ICD-10-CM | POA: Diagnosis present

## 2023-04-29 DIAGNOSIS — J383 Other diseases of vocal cords: Secondary | ICD-10-CM | POA: Insufficient documentation

## 2023-04-29 LAB — CBC WITH DIFFERENTIAL/PLATELET
Abs Immature Granulocytes: 0.74 10*3/uL — ABNORMAL HIGH (ref 0.00–0.07)
Basophils Absolute: 0.1 10*3/uL (ref 0.0–0.1)
Basophils Relative: 0 %
Eosinophils Absolute: 0.1 10*3/uL (ref 0.0–0.5)
Eosinophils Relative: 0 %
HCT: 31.9 % — ABNORMAL LOW (ref 36.0–46.0)
Hemoglobin: 10.4 g/dL — ABNORMAL LOW (ref 12.0–15.0)
Immature Granulocytes: 4 %
Lymphocytes Relative: 27 %
Lymphs Abs: 5.3 10*3/uL — ABNORMAL HIGH (ref 0.7–4.0)
MCH: 31 pg (ref 26.0–34.0)
MCHC: 32.6 g/dL (ref 30.0–36.0)
MCV: 94.9 fL (ref 80.0–100.0)
Monocytes Absolute: 1.3 10*3/uL — ABNORMAL HIGH (ref 0.1–1.0)
Monocytes Relative: 7 %
Neutro Abs: 11.9 10*3/uL — ABNORMAL HIGH (ref 1.7–7.7)
Neutrophils Relative %: 62 %
Platelets: 348 10*3/uL (ref 150–400)
RBC: 3.36 MIL/uL — ABNORMAL LOW (ref 3.87–5.11)
RDW: 14.1 % (ref 11.5–15.5)
WBC: 19.3 10*3/uL — ABNORMAL HIGH (ref 4.0–10.5)
nRBC: 0 % (ref 0.0–0.2)

## 2023-04-29 LAB — COMPREHENSIVE METABOLIC PANEL
ALT: 24 U/L (ref 0–44)
AST: 27 U/L (ref 15–41)
Albumin: 3.2 g/dL — ABNORMAL LOW (ref 3.5–5.0)
Alkaline Phosphatase: 45 U/L (ref 38–126)
Anion gap: 13 (ref 5–15)
BUN: 8 mg/dL (ref 6–20)
CO2: 18 mmol/L — ABNORMAL LOW (ref 22–32)
Calcium: 8.6 mg/dL — ABNORMAL LOW (ref 8.9–10.3)
Chloride: 107 mmol/L (ref 98–111)
Creatinine, Ser: 0.43 mg/dL — ABNORMAL LOW (ref 0.44–1.00)
GFR, Estimated: >60 mL/min (ref >60)
Glucose, Bld: 89 mg/dL (ref 70–99)
Potassium: 3.5 mmol/L (ref 3.5–5.1)
Sodium: 138 mmol/L (ref 135–145)
Total Bilirubin: 0.3 mg/dL (ref 0.3–1.2)
Total Protein: 6.7 g/dL (ref 6.5–8.1)

## 2023-04-29 MED ORDER — LEVALBUTEROL HCL 0.63 MG/3ML IN NEBU
0.6300 mg | INHALATION_SOLUTION | Freq: Once | RESPIRATORY_TRACT | Status: AC
  Administered 2023-04-29: 0.63 mg via RESPIRATORY_TRACT
  Filled 2023-04-29: qty 3

## 2023-04-29 MED ORDER — RACEPINEPHRINE HCL 2.25 % IN NEBU
0.5000 mL | INHALATION_SOLUTION | Freq: Once | RESPIRATORY_TRACT | Status: AC
  Administered 2023-04-29: 0.5 mL via RESPIRATORY_TRACT
  Filled 2023-04-29: qty 0.5

## 2023-04-29 MED ORDER — SODIUM CHLORIDE 0.9 % IV BOLUS
1000.0000 mL | Freq: Once | INTRAVENOUS | Status: AC
  Administered 2023-04-29: 1000 mL via INTRAVENOUS

## 2023-04-29 MED ORDER — LORAZEPAM 2 MG/ML IJ SOLN
0.5000 mg | Freq: Once | INTRAMUSCULAR | Status: AC
  Administered 2023-04-29: 0.5 mg via INTRAVENOUS
  Filled 2023-04-29: qty 1

## 2023-04-29 MED ORDER — METHYLPREDNISOLONE SODIUM SUCC 125 MG IJ SOLR
125.0000 mg | Freq: Once | INTRAMUSCULAR | Status: AC
  Administered 2023-04-29: 125 mg via INTRAVENOUS
  Filled 2023-04-29: qty 2

## 2023-04-29 NOTE — ED Provider Notes (Signed)
After the patient decided to leave AMA she gave herself another epinephrine shot in the emergency department   Bethann Berkshire, MD 04/29/23 [phone]

## 2023-04-29 NOTE — ED Notes (Signed)
Reevaluated patient, airway patent. Able to speak in full sentences with no acute distress noted. Respirations even and unlabored. Cardiac monitor and continuous 02 remains in place

## 2023-04-29 NOTE — ED Notes (Signed)
MD Zammit at bedside.Patient upset because MD will not give more EPI. Pt is speaking in full sentences, no signs of distress at this time. Patient airway patent. Patient screaming at staff and MD for not getting more EPI.  Patient stops hyperventilating while talking to staff. Attempting to make herself wheeze.

## 2023-04-29 NOTE — ED Notes (Signed)
Pt not wanting to be admitted. Requesting to leave

## 2023-04-29 NOTE — ED Provider Notes (Signed)
[place] EMERGENCY DEPARTMENT AT [facility] Provider Note   CSN: [id number] Arrival date & time: 04/29/23  1430     History {Add pertinent medical, surgical, social history, OB history to HPI:1} Chief Complaint  Patient presents with   Allergic Reaction    Darene J Maese is a 27 y.o. female.  Patient presented with stridor sounds.  She has been given 2 epinephrines prior to the time I saw alert.  She has been evaluated in the emergency department several times for "allergic reactions and was seen by critical care, pulmonary yesterday who feel like her problem is more of vocal cord dysfunction possibly related to increased acid from her stomach for being pregnant.  When I initially evaluated her and felt like her wheezing was expiratory upper airway wheezing.  We treated this with some racemic epi and steroids and Benadryl.   Allergic Reaction      Home Medications Prior to Admission medications   Medication Sig Start Date End Date Taking? Authorizing Provider  albuterol (PROVENTIL) (2.5 MG/3ML) 0.083% nebulizer solution Take 3 mLs (2.5 mg total) by nebulization every 6 (six) hours as needed for wheezing or shortness of breath. 01/15/23   Venora Maples, MD  albuterol (VENTOLIN HFA) 108 (90 Base) MCG/ACT inhaler Inhale 1-2 puffs into the lungs every 4 (four) hours as needed for wheezing or shortness of breath. 01/15/23   Venora Maples, MD  alprazolam Prudy Feeler) 2 MG tablet Take 1 mg by mouth every 8 (eight) hours as needed for anxiety.    [provider]  cloNIDine (CATAPRES) 0.1 MG tablet Take 0.1 mg by mouth at bedtime.    [provider]  diphenhydrAMINE (BENADRYL) 25 MG tablet Take 50 mg by mouth daily as needed (following anaphylactic reaction).    [provider]  EPINEPHrine 0.3 mg/0.3 mL IJ SOAJ injection Inject 0.3 mg into the muscle as needed for anaphylaxis. 04/28/23   Laurence Spates, MD  famotidine (PEPCID) 20 MG tablet  Take 20 mg by mouth 2 (two) times daily.    [provider]  hydrOXYzine (ATARAX) 25 MG tablet Take 25 mg by mouth 3 (three) times daily as needed for anxiety.    [provider]  insulin glargine (LANTUS SOLOSTAR) 100 UNIT/ML Solostar Pen Inject 20 Units into the skin at bedtime.    [provider]  labetalol (NORMODYNE) 100 MG tablet Take 1 tablet (100 mg total) by mouth 2 (two) times daily. 01/17/23   Rasch, Victorino Dike I, NP  levothyroxine (SYNTHROID) 25 MCG tablet Take 25-50 mcg by mouth See admin instructions. Take 25 mcg (1 tablet) once daily before breakfast, except take 50 mcg (2 tablets) before breakfast on Monday and Friday.    [provider]  metoCLOPramide (REGLAN) 10 MG tablet Take 10 mg by mouth 4 (four) times daily as needed for nausea.    [provider]  ondansetron (ZOFRAN) 4 MG tablet Take 4 mg by mouth every 8 (eight) hours as needed for nausea or vomiting.    [provider]  ondansetron (ZOFRAN-ODT) 4 MG disintegrating tablet Take 1 tablet (4 mg total) by mouth every 8 (eight) hours as needed for nausea or vomiting. Patient not taking: Reported on 04/28/2023 03/20/23   Carlynn Herald, CNM  Prenatal Vit-Fe Fumarate-FA (PRENATAL VITAMIN PO) Take 1 tablet by mouth daily.    [provider]  promethazine (PHENERGAN) 25 MG suppository Place 1 suppository (25 mg total) rectally every 6 (six) hours as needed  for nausea or vomiting. 01/15/23   Venora Maples, MD  promethazine (PHENERGAN) 25 MG tablet Take 1 tablet (25 mg total) by mouth every 6 (six) hours as needed for nausea or vomiting. 01/15/23   Venora Maples, MD  scopolamine (TRANSDERM-SCOP) 1 MG/3DAYS Place 1 patch (1.5 mg total) onto the skin every 3 (three) days. 03/20/23   Carlynn Herald, CNM  SYMBICORT 160-4.5 MCG/ACT inhaler Inhale 2 puffs into the lungs 2 (two) times daily as needed. 09/12/22   [provider]      Allergies    Sesame seed  (diagnostic), Soy allergy, Soybean oil, Egg-derived products, Milk (cow), and Wheat    Review of Systems   Review of Systems  Physical Exam Updated Vital Signs BP 105/75 (BP Location: Right Arm)   Pulse (!) 112   Temp 98.1 F (36.7 C) (Oral)   Resp 20   Ht 5\' 2"  (1.575 m)   Wt 104.3 kg   LMP 12/16/2022   SpO2 100%   BMI 42.06 kg/m  Physical Exam  ED Results / Procedures / Treatments   Labs (all labs ordered are listed, but only abnormal results are displayed) Labs Reviewed  CBC WITH DIFFERENTIAL/PLATELET - Abnormal; Notable for the following components:      Result Value   WBC 19.3 (*)    RBC 3.36 (*)    Hemoglobin 10.4 (*)    HCT 31.9 (*)    Neutro Abs 11.9 (*)    Lymphs Abs 5.3 (*)    Monocytes Absolute 1.3 (*)    Abs Immature Granulocytes 0.74 (*)    All other components within normal limits  COMPREHENSIVE METABOLIC PANEL    EKG None  Radiology No results found.  Procedures Procedures  {Document cardiac monitor, telemetry assessment procedure when appropriate:1}  Medications Ordered in ED Medications  methylPREDNISolone sodium succinate (SOLU-MEDROL) 125 mg/2 mL injection 125 mg (125 mg Intravenous Given 04/29/23 1518)  Racepinephrine HCl 2.25 % nebulizer solution 0.5 mL (0.5 mLs Nebulization Given 04/29/23 1504)  LORazepam (ATIVAN) injection 0.5 mg (0.5 mg Intravenous Given 04/29/23 1500)  sodium chloride 0.9 % bolus 1,000 mL (1,000 mLs Intravenous New Bag/Given 04/29/23 1518)  LORazepam (ATIVAN) injection 0.5 mg (0.5 mg Intravenous Given 04/29/23 1602)  levalbuterol (XOPENEX) nebulizer solution 0.63 mg (0.63 mg Nebulization Given 04/29/23 1601)    ED Course/ Medical Decision Making/ A&P   {Patient improved with the racemic epi.  She Wanting more epinephrine injected.  I told her that this was not the appropriate treatment for this I do not think this is allergic reaction I think this is more expiratory stridor secondary to vocal cord dysfunction.   Patient was unhappy with that discussion and decided to leave AMA Click here for ABCD2, HEART and other calculatorsREFRESH Note before signing :1}                              Medical Decision Making Amount and/or Complexity of Data Reviewed Labs: ordered.  Risk OTC drugs. Prescription drug management.   Expiratory stridorous wheezes.  Most likely related to vocal cord dysfunction possibly from increased acid production from pregnancy.  I offered admission for observation in the hospital because this is about her fifth visit recently for the same problem.  Patient decided to leave AMA {Document critical care time when appropriate:1} {Document review of labs and clinical decision tools ie heart score, Chads2Vasc2 etc:1}  {Document your independent review of radiology  images, and any outside records:1} {Document your discussion with family members, caretakers, and with consultants:1} {Document social determinants of health affecting pt's care:1} {Document your decision making why or why not admission, treatments were needed:1} Final Clinical Impression(s) / ED Diagnoses Final diagnoses:  None    Rx / DC Orders ED Discharge Orders     None

## 2023-04-29 NOTE — ED Notes (Signed)
Charge nurse made aware patient wants to speak with someone higher than MD and this RN. Nursing supervisor Dawn called to bedside

## 2023-04-29 NOTE — ED Notes (Signed)
MD Zammit at bedside to reevaluate

## 2023-04-29 NOTE — ED Triage Notes (Signed)
Started insulin today. CBG 60 when she checked it. Pt took a glucose tablet and started to have an allergic reaction. Epi pen and still had trouble breathing and was wheezing. Total 0.6 Epi. 50 benadryl PO. 5 albuterol. 0.5 Atrovent. [redacted] weeks pregnant.  BP 114/60 HR 90 96% RA Neb 99%

## 2023-04-29 NOTE — ED Notes (Signed)
Pt called RN to room to tell RN she gave herself her own EPI in her thigh. Requesting MD to come back to bedside to reevaluate her. Endorses she is able to breath now, does not have stridor anymore and her HR has gone down. MD returned to bedside, advised patient took her own EPI. MD states will no longer speak to patient, walks out of room.

## 2023-04-30 ENCOUNTER — Other Ambulatory Visit (HOSPITAL_BASED_OUTPATIENT_CLINIC_OR_DEPARTMENT_OTHER): Payer: Self-pay

## 2023-04-30 ENCOUNTER — Other Ambulatory Visit: Payer: Self-pay

## 2023-05-01 ENCOUNTER — Other Ambulatory Visit (HOSPITAL_BASED_OUTPATIENT_CLINIC_OR_DEPARTMENT_OTHER): Payer: Self-pay

## 2023-06-23 ENCOUNTER — Inpatient Hospital Stay (HOSPITAL_COMMUNITY)
Admission: AD | Admit: 2023-06-23 | Discharge: 2023-06-23 | Disposition: A | Discharge status: Home / Self Care | Payer: Medicaid Other | Attending: Obstetrics and Gynecology | Admitting: Obstetrics and Gynecology

## 2023-06-23 ENCOUNTER — Other Ambulatory Visit: Payer: Self-pay

## 2023-06-23 DIAGNOSIS — O99342 Other mental disorders complicating pregnancy, second trimester: Secondary | ICD-10-CM | POA: Diagnosis not present

## 2023-06-23 DIAGNOSIS — N858 Other specified noninflammatory disorders of uterus: Secondary | ICD-10-CM

## 2023-06-23 DIAGNOSIS — Z79899 Other long term (current) drug therapy: Secondary | ICD-10-CM | POA: Insufficient documentation

## 2023-06-23 DIAGNOSIS — R519 Headache, unspecified: Secondary | ICD-10-CM | POA: Diagnosis not present

## 2023-06-23 DIAGNOSIS — R102 Pelvic and perineal pain: Secondary | ICD-10-CM | POA: Diagnosis not present

## 2023-06-23 DIAGNOSIS — O10912 Unspecified pre-existing hypertension complicating pregnancy, second trimester: Secondary | ICD-10-CM | POA: Diagnosis not present

## 2023-06-23 DIAGNOSIS — O162 Unspecified maternal hypertension, second trimester: Secondary | ICD-10-CM

## 2023-06-23 DIAGNOSIS — R42 Dizziness and giddiness: Secondary | ICD-10-CM | POA: Insufficient documentation

## 2023-06-23 DIAGNOSIS — F419 Anxiety disorder, unspecified: Secondary | ICD-10-CM | POA: Diagnosis not present

## 2023-06-23 DIAGNOSIS — O26892 Other specified pregnancy related conditions, second trimester: Secondary | ICD-10-CM | POA: Diagnosis not present

## 2023-06-23 DIAGNOSIS — R21 Rash and other nonspecific skin eruption: Secondary | ICD-10-CM | POA: Insufficient documentation

## 2023-06-23 DIAGNOSIS — Z3A27 27 weeks gestation of pregnancy: Secondary | ICD-10-CM | POA: Insufficient documentation

## 2023-06-23 DIAGNOSIS — R000 Tachycardia, unspecified: Secondary | ICD-10-CM | POA: Insufficient documentation

## 2023-06-23 LAB — COMPREHENSIVE METABOLIC PANEL
ALT: 11 U/L (ref 0–44)
AST: 19 U/L (ref 15–41)
Albumin: 2.9 g/dL — ABNORMAL LOW (ref 3.5–5.0)
Alkaline Phosphatase: 75 U/L (ref 38–126)
Anion gap: 13 (ref 5–15)
BUN: <5 mg/dL — ABNORMAL LOW (ref 6–20)
CO2: 18 mmol/L — ABNORMAL LOW (ref 22–32)
Calcium: 9.4 mg/dL (ref 8.9–10.3)
Chloride: 104 mmol/L (ref 98–111)
Creatinine, Ser: 0.54 mg/dL (ref 0.44–1.00)
GFR, Estimated: >60 mL/min (ref >60)
Glucose, Bld: 177 mg/dL — ABNORMAL HIGH (ref 70–99)
Potassium: 3.9 mmol/L (ref 3.5–5.1)
Sodium: 135 mmol/L (ref 135–145)
Total Bilirubin: 0.2 mg/dL (ref <1.2)
Total Protein: 6.7 g/dL (ref 6.5–8.1)

## 2023-06-23 LAB — CBC
HCT: 33.8 % — ABNORMAL LOW (ref 36.0–46.0)
Hemoglobin: 11.2 g/dL — ABNORMAL LOW (ref 12.0–15.0)
MCH: 30.9 pg (ref 26.0–34.0)
MCHC: 33.1 g/dL (ref 30.0–36.0)
MCV: 93.1 fL (ref 80.0–100.0)
Platelets: 365 10*3/uL (ref 150–400)
RBC: 3.63 MIL/uL — ABNORMAL LOW (ref 3.87–5.11)
RDW: 13.9 % (ref 11.5–15.5)
WBC: 18.7 10*3/uL — ABNORMAL HIGH (ref 4.0–10.5)
nRBC: 0 % (ref 0.0–0.2)

## 2023-06-23 MED ORDER — ACETAMINOPHEN 325 MG PO TABS
650.0000 mg | ORAL_TABLET | Freq: Once | ORAL | Status: DC
  Filled 2023-06-23: qty 2

## 2023-06-23 NOTE — MAU Provider Note (Signed)
Chief Complaint:  Hypertension   Event Date/Time   First Provider Initiated Contact with Patient 06/23/23 2212     HPI: Carol Paul is a 27 y.o. G2P0110 at 20w0dwho presents to maternity admissions reporting racing heart rate and elevated BP to 156/146.  Took her Labetalol and Clonidine at 2010.  Reports headache, lightheadedness, shortness of breath and seeing spots to RN.  Also reports pelvic cramping.  Partner is at bedside and contributing to history taking. .She reports good fetal movement, denies LOF, vaginal bleeding, n/v, or fever/chills.   States she switched from [facility] to [facility] because of scheduling problems.  States they do an EKG at every OB visit due to her tachycardia.  Initially stated she had not had a full cardiology workup for this but later stated she has had multiple cardiac workups in office and in the ED.  Hypertension This is a chronic problem. The problem has been gradually worsening since onset. Associated symptoms include anxiety, headaches and shortness of breath. Pertinent negatives include no blurred vision (states sees spots) or chest pain. There are no associated agents to hypertension. Treatments tried: Labetalol and clonidine. There are no compliance problems.    RN Note: Carol Paul is a 27 y.o. at [redacted]w[redacted]d here in MAU reporting: elevated BP 156/146 and heart was racing - states pulse was in 150s. On labetalol 100mg  2x/day and .1mg  of clonidine - last dose around 2010 tonight. Reports HA, lightheaded, SOB, and seeing spots. Denies epigastric pain. Reports lower abdominal cramping. Denies VB or LOF. +FM   Onset of complaint: 2010  Past Medical History: Past Medical History:  Diagnosis Date   Anxiety    Asthma    Diabetes mellitus without complication (HCC)    Hypertension    Hyperthyroidism    Ovarian cyst    UTI (urinary tract infection)     Past obstetric history: OB History  Gravida Para Term Preterm AB Living  2 1 0 1 1 0  SAB IAB Ectopic  Multiple Live Births  0 0 0 1 1    # Outcome Date GA Lbr Len/2nd Weight Sex Type Anes PTL Lv  2 Current           1A Preterm 08/30/22 [redacted]w[redacted]d 08:35 150 g F Vag-Spont EPI  ND  1B AB 08/30/22 [redacted]w[redacted]d 08:35 / 00:17 185 g M Vag-Spont EPI  FD    Past Surgical History: Past Surgical History:  Procedure Laterality Date   ANKLE SURGERY Left    TONSILLECTOMY      Family History: Family History  Problem Relation Age of Onset   Healthy Mother    Hypothyroidism Father    CAD Other     Social History: Social History   Tobacco Use   Smoking status: Former    Passive exposure: Current   Smokeless tobacco: Never  Vaping Use   Vaping status: Never Used  Substance Use Topics   Alcohol use: Not Currently    Comment: social   Drug use: Not Currently    Types: Marijuana    Comment: daily    Allergies:  Allergies  Allergen Reactions   Sesame Seed (Diagnostic) Anaphylaxis   Soy Allergy (Do Not Select) Anaphylaxis   Soybean Oil Anaphylaxis   Egg-Derived Products Diarrhea, Nausea Only and Other (See Comments)    GI discomfort   Milk (Cow) Diarrhea, Nausea Only and Other (See Comments)    GI discomfort   Wheat Diarrhea, Nausea Only and Other (See Comments)  GI discomfort    Meds:  Medications Prior to Admission  Medication Sig Dispense Refill Last Dose/Taking   albuterol (PROVENTIL) (2.5 MG/3ML) 0.083% nebulizer solution Take 3 mLs (2.5 mg total) by nebulization every 6 (six) hours as needed for wheezing or shortness of breath. 75 mL 1 06/23/2023   albuterol (VENTOLIN HFA) 108 (90 Base) MCG/ACT inhaler Inhale 1-2 puffs into the lungs every 4 (four) hours as needed for wheezing or shortness of breath. 54 g 5 06/23/2023   alprazolam (XANAX) 2 MG tablet Take 1 mg by mouth every 8 (eight) hours as needed for anxiety.   06/22/2023   cloNIDine (CATAPRES) 0.1 MG tablet Take 0.1 mg by mouth at bedtime.   06/23/2023 at  8:00 PM   diphenhydrAMINE (BENADRYL) 25 MG tablet Take 50 mg by mouth  daily as needed (following anaphylactic reaction).   Past Week   EPINEPHrine 0.3 mg/0.3 mL IJ SOAJ injection Inject 0.3 mg into the muscle as needed for anaphylaxis. 2 each 1 Past Week   famotidine (PEPCID) 20 MG tablet Take 20 mg by mouth 2 (two) times daily.   06/23/2023   insulin glargine (LANTUS SOLOSTAR) 100 UNIT/ML Solostar Pen Inject 20 Units into the skin at bedtime.   06/22/2023   labetalol (NORMODYNE) 100 MG tablet Take 1 tablet (100 mg total) by mouth 2 (two) times daily. 90 tablet 1 06/23/2023 at  8:00 PM   levothyroxine (SYNTHROID) 25 MCG tablet Take 25-50 mcg by mouth See admin instructions. Take 25 mcg (1 tablet) once daily before breakfast, except take 50 mcg (2 tablets) before breakfast on Monday and Friday.   06/23/2023   ondansetron (ZOFRAN) 4 MG tablet Take 4 mg by mouth every 8 (eight) hours as needed for nausea or vomiting.   06/23/2023   ondansetron (ZOFRAN-ODT) 4 MG disintegrating tablet Take 1 tablet (4 mg total) by mouth every 8 (eight) hours as needed for nausea or vomiting. 15 tablet 0 Past Month   Prenatal Vit-Fe Fumarate-FA (PRENATAL VITAMIN PO) Take 1 tablet by mouth daily.   06/22/2023   promethazine (PHENERGAN) 25 MG tablet Take 1 tablet (25 mg total) by mouth every 6 (six) hours as needed for nausea or vomiting. 30 tablet 5 06/23/2023   scopolamine (TRANSDERM-SCOP) 1 MG/3DAYS Place 1 patch (1.5 mg total) onto the skin every 3 (three) days. 10 patch 1 Past Week   SYMBICORT 160-4.5 MCG/ACT inhaler Inhale 2 puffs into the lungs 2 (two) times daily as needed.   06/23/2023   hydrOXYzine (ATARAX) 25 MG tablet Take 25 mg by mouth 3 (three) times daily as needed for anxiety.   Unknown   metoCLOPramide (REGLAN) 10 MG tablet Take 10 mg by mouth 4 (four) times daily as needed for nausea.   Unknown   promethazine (PHENERGAN) 25 MG suppository Place 1 suppository (25 mg total) rectally every 6 (six) hours as needed for nausea or vomiting. 12 each 5 Unknown    I have reviewed  patient's Past Medical Hx, Surgical Hx, Family Hx, Social Hx, medications and allergies.   ROS:  Review of Systems  Eyes:  Negative for blurred vision (states sees spots).  Respiratory:  Positive for shortness of breath.   Cardiovascular:  Negative for chest pain.  Neurological:  Positive for headaches.   Other systems negative  Physical Exam  Patient Vitals for the past 24 hrs:  BP Temp Temp src Pulse Resp SpO2 Height Weight  06/23/23 2114 (!) 106/49 -- -- (!) 146 20 -- -- --  06/23/23 2056 (!) 135/90 98.2 F (36.8 C) Oral (!) 154 20 98 % 5\' 2"  (1.575 m) 108.9 kg   Vitals:   06/23/23 2056 06/23/23 2114 06/23/23 2130 06/23/23 2145  BP: (!) 135/90 (!) 106/49 112/77 132/77   06/23/23 2215 06/23/23 2230 06/23/23 2300  BP: 111/85 123/70 122/85   She brought her BP cuff/machine with her and when we measured her BP here it read abnormally (syst/dias incompatible with life, not anywhere near normal range) BPs on our machine as above Constitutional: Well-developed, well-nourished female in no acute distress, but anxious at times. .  Cardiovascular: tachycardic rate  Her HR ranged from 150s to 120s.  States this is normal for her.  Respiratory: normal effort GI: Abd soft, non-tender, gravid appropriate for gestational age.   No rebound or guarding. MS: Extremities nontender, no edema, normal ROM Neurologic: Alert and oriented x 4.  GU: Neg CVAT.  PELVIC EXAM:   Dilation: Closed Effacement (%): Thick Station: -3 Exam by:: Mayford Knife, CNM   FHT:  Baseline 170s initially when pt was in 150s, then down to 150s-160s baseline. , moderate variability, accelerations present, no decelerations Contractions: intermittent irritability.   Labs: Results for orders placed or performed during the hospital encounter of 06/23/23 (from the past 24 hours)  CBC     Status: Abnormal   Collection Time: 06/23/23 10:00 PM  Result Value Ref Range   WBC 18.7 (H) 4.0 - 10.5 K/uL   RBC 3.63 (L) 3.87 - 5.11  MIL/uL   Hemoglobin 11.2 (L) 12.0 - 15.0 g/dL   HCT 16.1 (L) 09.6 - 04.5 %   MCV 93.1 80.0 - 100.0 fL   MCH 30.9 26.0 - 34.0 pg   MCHC 33.1 30.0 - 36.0 g/dL   RDW 40.9 81.1 - 91.4 %   Platelets 365 150 - 400 K/uL   nRBC 0.0 0.0 - 0.2 %  Comprehensive metabolic panel     Status: Abnormal   Collection Time: 06/23/23 10:00 PM  Result Value Ref Range   Sodium 135 135 - 145 mmol/L   Potassium 3.9 3.5 - 5.1 mmol/L   Chloride 104 98 - 111 mmol/L   CO2 18 (L) 22 - 32 mmol/L   Glucose, Bld 177 (H) 70 - 99 mg/dL   BUN <5 (L) 6 - 20 mg/dL   Creatinine, Ser 7.82 0.44 - 1.00 mg/dL   Calcium 9.4 8.9 - 95.6 mg/dL   Total Protein 6.7 6.5 - 8.1 g/dL   Albumin 2.9 (L) 3.5 - 5.0 g/dL   AST 19 15 - 41 U/L   ALT 11 0 - 44 U/L   Alkaline Phosphatase 75 38 - 126 U/L   Total Bilirubin 0.2 <1.2 mg/dL   GFR, Estimated >21 >30 mL/min   Anion gap 13 5 - 15    --/--/A POS (07/10 0920)  Imaging:  12 lead EKG:  Rate 124, sinus tachycardia.   MAU Course/MDM: I have reviewed the triage vital signs and the nursing notes.   Pertinent labs & imaging results that were available during my care of the patient were reviewed by me and considered in my medical decision making (see chart for details).      I have reviewed her medical records including past results, notes and treatments.   I have ordered labs and reviewed results. States she is unable to give urine specimen, states she has trouble with emptying bladder and voids infrequently per RN  She states she will get a Protein/Creat ratio at  her office visit at [facility] and does not want to do it here NST reviewed, variable fetal tachycardia associated with variable maternal tachycardia  Treatments in MAU included EFM, EKG, Tylenol ordered for headache, declined by patient.  Discussed we have an incomplete preeclampsia workup, patient states she prefers to finish it at [facility]. Memory Argue to try to get her a new appt with Dr Servando [facility] last appt) but she states she  wishes to do that at [facility].  Assessment: Single IUP at [redacted]w[redacted]d Tachycardia, without hemodynamic compromise Hypertension, chronic (home BP machine is out of calibration) Headache Uterine irritability Anxiety  Plan: Discharge home Preeclampsia precautions and fetal kick counts Continue meds at home  Follow up in Office for prenatal visits and recheck Encouraged to return if she develops worsening of symptoms, increase in pain, fever, or other concerning symptoms.   Pt stable at time of discharge.  Wynelle Bourgeois CNM, MSN Certified Nurse-Midwife 06/23/2023 10:13 PM

## 2023-06-23 NOTE — MAU Note (Signed)
.  Carol Paul is a 27 y.o. at [redacted]w[redacted]d here in MAU reporting: elevated BP 156/146 and heart was racing - states pulse was in 150s. On labetalol 100mg  2x/day and .1mg  of clonidine - last dose around 2010 tonight. Reports HA, lightheaded, SOB, and seeing spots. Denies epigastric pain. Reports lower abdominal cramping. Denies VB or LOF. +FM   Onset of complaint: 2010 Pain score: 7 - HA; 6 - abdomen Vitals:   06/23/23 2056  BP: (!) 135/90  Pulse: (!) 154  Resp: 20  Temp: 98.2 F (36.8 C)  SpO2: 98%     FHT:175 Lab orders placed from triage:  UA

## 2023-07-09 DIAGNOSIS — F603 Borderline personality disorder: Secondary | ICD-10-CM

## 2023-07-09 HISTORY — DX: Borderline personality disorder: F60.3

## 2023-08-08 ENCOUNTER — Inpatient Hospital Stay (HOSPITAL_COMMUNITY)
Admission: AD | Admit: 2023-08-08 | Discharge: 2023-08-09 | Disposition: A | Discharge status: Home / Self Care | Payer: Medicaid Other | Attending: Family Medicine | Admitting: Family Medicine

## 2023-08-08 ENCOUNTER — Inpatient Hospital Stay (HOSPITAL_COMMUNITY): Payer: Medicaid Other

## 2023-08-08 ENCOUNTER — Encounter (HOSPITAL_COMMUNITY): Payer: Self-pay | Admitting: Family Medicine

## 2023-08-08 DIAGNOSIS — O26893 Other specified pregnancy related conditions, third trimester: Secondary | ICD-10-CM | POA: Diagnosis not present

## 2023-08-08 DIAGNOSIS — O212 Late vomiting of pregnancy: Secondary | ICD-10-CM | POA: Insufficient documentation

## 2023-08-08 DIAGNOSIS — T7491XA Unspecified adult maltreatment, confirmed, initial encounter: Secondary | ICD-10-CM

## 2023-08-08 DIAGNOSIS — O24113 Pre-existing diabetes mellitus, type 2, in pregnancy, third trimester: Secondary | ICD-10-CM | POA: Diagnosis not present

## 2023-08-08 DIAGNOSIS — O99353 Diseases of the nervous system complicating pregnancy, third trimester: Secondary | ICD-10-CM | POA: Insufficient documentation

## 2023-08-08 DIAGNOSIS — Z3A33 33 weeks gestation of pregnancy: Secondary | ICD-10-CM | POA: Diagnosis not present

## 2023-08-08 DIAGNOSIS — R9389 Abnormal findings on diagnostic imaging of other specified body structures: Secondary | ICD-10-CM

## 2023-08-08 DIAGNOSIS — R519 Headache, unspecified: Secondary | ICD-10-CM

## 2023-08-08 DIAGNOSIS — G44309 Post-traumatic headache, unspecified, not intractable: Secondary | ICD-10-CM | POA: Insufficient documentation

## 2023-08-08 DIAGNOSIS — Z3689 Encounter for other specified antenatal screening: Secondary | ICD-10-CM

## 2023-08-08 DIAGNOSIS — O219 Vomiting of pregnancy, unspecified: Secondary | ICD-10-CM

## 2023-08-08 LAB — URINALYSIS, ROUTINE W REFLEX MICROSCOPIC
Bilirubin Urine: NEGATIVE
Glucose, UA: NEGATIVE mg/dL
Ketones, ur: 20 mg/dL — AB
Nitrite: NEGATIVE
Protein, ur: NEGATIVE mg/dL
Specific Gravity, Urine: 1.01 (ref 1.005–1.030)
pH: 6 (ref 5.0–8.0)

## 2023-08-08 LAB — GLUCOSE, CAPILLARY: Glucose-Capillary: 77 mg/dL (ref 70–99)

## 2023-08-08 MED ORDER — METOCLOPRAMIDE HCL 10 MG PO TABS: 10.0000 mg | ORAL_TABLET | Freq: Once | ORAL | Status: DC

## 2023-08-08 MED ORDER — ONDANSETRON 4 MG PO TBDP
4.0000 mg | ORAL_TABLET | Freq: Once | ORAL | Status: DC
  Filled 2023-08-08: qty 1

## 2023-08-08 MED ORDER — LACTATED RINGERS IV BOLUS
1000.0000 mL | Freq: Once | INTRAVENOUS | Status: AC
  Administered 2023-08-08: 1000 mL via INTRAVENOUS

## 2023-08-08 MED ORDER — ONDANSETRON 4 MG PO TBDP: 8.0000 mg | ORAL_TABLET | Freq: Once | ORAL | Status: DC

## 2023-08-08 MED ORDER — ONDANSETRON HCL 4 MG/2ML IJ SOLN
4.0000 mg | Freq: Once | INTRAMUSCULAR | Status: AC
  Administered 2023-08-08: 4 mg via INTRAVENOUS
  Filled 2023-08-08: qty 2

## 2023-08-08 NOTE — MAU Note (Signed)
Provider notified pt declines zofran. States she feels dehydrated and needs ivf's. Reports she took zofran and tylenol at 5 pm but reports she has not been able to keep anything down today. Pt up to void

## 2023-08-08 NOTE — MAU Note (Signed)
 Provider at bedside

## 2023-08-08 NOTE — MAU Provider Note (Signed)
History     CSN: [id number]  Arrival date and time: 08/08/23 2040   Event Date/Time   First Provider Initiated Contact with Patient 08/08/23 2144      Chief Complaint  Patient presents with  . Head Injury   Carol Paul is a 28 y.o. G***P*** at [redacted]w[redacted]d who receives care at ***.  She presents today for ***  OB History     Gravida  2   Para  1   Term  0   Preterm  1   AB  1   Living  0      SAB  0   IAB  0   Ectopic  0   Multiple  1   Live Births  1           Past Medical History:  Diagnosis Date  . Anxiety   . Asthma   . Diabetes mellitus without complication (HCC)   . Hypertension   . Hyperthyroidism   . Ovarian cyst   . UTI (urinary tract infection)     Past Surgical History:  Procedure Laterality Date  . ANKLE SURGERY Left   . TONSILLECTOMY      Family History  Problem Relation Age of Onset  . Healthy Mother   . Hypothyroidism Father   . CAD Other     Social History   Tobacco Use  . Smoking status: Former    Passive exposure: Current  . Smokeless tobacco: Never  Vaping Use  . Vaping status: Never Used  Substance Use Topics  . Alcohol use: Not Currently    Comment: social  . Drug use: Not Currently    Types: Marijuana    Comment: daily    Allergies:  Allergies  Allergen Reactions  . Sesame Seed (Diagnostic) Anaphylaxis  . Soy Allergy (Do Not Select) Anaphylaxis  . Soybean Oil Anaphylaxis  . Egg-Derived Products Diarrhea, Nausea Only and Other (See Comments)    GI discomfort  . Milk (Cow) Diarrhea, Nausea Only and Other (See Comments)    GI discomfort  . Wheat Diarrhea, Nausea Only and Other (See Comments)    GI discomfort    Medications Prior to Admission  Medication Sig Dispense Refill Last Dose/Taking  . albuterol (PROVENTIL) (2.5 MG/3ML) 0.083% nebulizer solution Take 3 mLs (2.5 mg total) by nebulization every 6 (six) hours as needed for wheezing or shortness of breath. 75 mL 1 Past Month  . albuterol  (VENTOLIN HFA) 108 (90 Base) MCG/ACT inhaler Inhale 1-2 puffs into the lungs every 4 (four) hours as needed for wheezing or shortness of breath. 54 g 5 08/08/2023  . alprazolam (XANAX) 2 MG tablet Take 1 mg by mouth every 8 (eight) hours as needed for anxiety.   Past Week  . cloNIDine (CATAPRES) 0.1 MG tablet Take 0.1 mg by mouth at bedtime.   08/07/2023  . famotidine (PEPCID) 20 MG tablet Take 20 mg by mouth 2 (two) times daily.   08/08/2023  . insulin aspart (NOVOLOG) 100 UNIT/ML injection Inject 25 Units into the skin 3 (three) times daily before meals.   Taking  . insulin glargine (LANTUS SOLOSTAR) 100 UNIT/ML Solostar Pen Inject 20 Units into the skin at bedtime.   08/07/2023  . labetalol (NORMODYNE) 100 MG tablet Take 1 tablet (100 mg total) by mouth 2 (two) times daily. (Patient taking differently: Take 100 mg by mouth 3 (three) times daily.) 90 tablet 1 08/08/2023  . levothyroxine (SYNTHROID) 25 MCG tablet Take 25-50 mcg by  mouth See admin instructions. Take 25 mcg (1 tablet) once daily before breakfast, except take 50 mcg (2 tablets) before breakfast on Monday and Friday.   08/08/2023  . ondansetron (ZOFRAN) 4 MG tablet Take 4 mg by mouth every 8 (eight) hours as needed for nausea or vomiting.   08/08/2023  . Prenatal Vit-Fe Fumarate-FA (PRENATAL VITAMIN PO) Take 1 tablet by mouth daily.   Past Week  . promethazine (PHENERGAN) 25 MG tablet Take 1 tablet (25 mg total) by mouth every 6 (six) hours as needed for nausea or vomiting. 30 tablet 5 08/08/2023  . scopolamine (TRANSDERM-SCOP) 1 MG/3DAYS Place 1 patch (1.5 mg total) onto the skin every 3 (three) days. 10 patch 1 08/08/2023  . SYMBICORT 160-4.5 MCG/ACT inhaler Inhale 2 puffs into the lungs 2 (two) times daily as needed.   08/08/2023  . diphenhydrAMINE (BENADRYL) 25 MG tablet Take 50 mg by mouth daily as needed (following anaphylactic reaction).     Marland Kitchen EPINEPHrine 0.3 mg/0.3 mL IJ SOAJ injection Inject 0.3 mg into the muscle as needed for  anaphylaxis. 2 each 1   . hydrOXYzine (ATARAX) 25 MG tablet Take 25 mg by mouth 3 (three) times daily as needed for anxiety.   Unknown  . metoCLOPramide (REGLAN) 10 MG tablet Take 10 mg by mouth 4 (four) times daily as needed for nausea.   Unknown  . ondansetron (ZOFRAN-ODT) 4 MG disintegrating tablet Take 1 tablet (4 mg total) by mouth every 8 (eight) hours as needed for nausea or vomiting. 15 tablet 0   . promethazine (PHENERGAN) 25 MG suppository Place 1 suppository (25 mg total) rectally every 6 (six) hours as needed for nausea or vomiting. 12 each 5     Review of Systems Physical Exam   Blood pressure 120/74, pulse (!) 111, temperature 98.4 F (36.9 C), resp. rate 18, height 5\' 2"  (1.575 m), weight 108.9 kg, last menstrual period 12/16/2022, SpO2 94%, unknown if currently breastfeeding.  Physical Exam  Fetal Assessment *** bpm, Mod Var, -Decels, +Accels Toco:   MAU Course  No results found for this or any previous visit (from the past 24 hours). No results found.  MDM PE Labs: EFM  Assessment and Plan  28 year old G2P0110  SIUP at 33.4 weeks Cat *** FT   -Exam findings discussed. Cherre Robins MSN, CNM 08/08/2023, 9:44 PM   Reassessment (10:16 PM) -Patient report nausea. -Reglan ordered and nurse returns stating patient refuses reporting h/o edema after usage. -Zofran 4mg  ordered.  -Nurse reports patient refusing
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
## 2023-08-09 DIAGNOSIS — O26893 Other specified pregnancy related conditions, third trimester: Secondary | ICD-10-CM | POA: Diagnosis not present

## 2023-08-09 LAB — GLUCOSE, CAPILLARY: Glucose-Capillary: 101 mg/dL — ABNORMAL HIGH (ref 70–99)

## 2023-08-09 MED ORDER — SODIUM CHLORIDE 0.9 % IV SOLN
25.0000 mg | Freq: Once | INTRAVENOUS | Status: AC
Start: 2023-08-09 — End: 2023-08-09
  Administered 2023-08-09: 25 mg via INTRAVENOUS
  Filled 2023-08-09: qty 1

## 2023-08-09 MED ORDER — MORPHINE SULFATE (PF) 4 MG/ML IV SOLN
4.0000 mg | Freq: Once | INTRAVENOUS | Status: AC
  Administered 2023-08-09: 4 mg via INTRAVENOUS
  Filled 2023-08-09: qty 1

## 2023-08-09 NOTE — MAU Note (Signed)
Pt having nausea following morphine IV-provider notified

## 2023-11-21 ENCOUNTER — Emergency Department (HOSPITAL_COMMUNITY)

## 2023-11-21 ENCOUNTER — Encounter (HOSPITAL_COMMUNITY): Payer: Self-pay

## 2023-11-21 ENCOUNTER — Other Ambulatory Visit: Payer: Self-pay

## 2023-11-21 ENCOUNTER — Inpatient Hospital Stay (HOSPITAL_COMMUNITY)
Admission: EM | Admit: 2023-11-21 | Discharge: 2023-11-24 | DRG: 917 | Disposition: A | Discharge status: Other Acute Inpatient Hospital | Attending: Family Medicine | Admitting: Family Medicine

## 2023-11-21 DIAGNOSIS — Z91010 Allergy to peanuts: Secondary | ICD-10-CM

## 2023-11-21 DIAGNOSIS — F4310 Post-traumatic stress disorder, unspecified: Secondary | ICD-10-CM | POA: Diagnosis present

## 2023-11-21 DIAGNOSIS — I959 Hypotension, unspecified: Secondary | ICD-10-CM | POA: Diagnosis present

## 2023-11-21 DIAGNOSIS — F319 Bipolar disorder, unspecified: Secondary | ICD-10-CM | POA: Diagnosis present

## 2023-11-21 DIAGNOSIS — T50901A Poisoning by unspecified drugs, medicaments and biological substances, accidental (unintentional), initial encounter: Secondary | ICD-10-CM | POA: Diagnosis present

## 2023-11-21 DIAGNOSIS — F332 Major depressive disorder, recurrent severe without psychotic features: Secondary | ICD-10-CM | POA: Diagnosis not present

## 2023-11-21 DIAGNOSIS — T43292A Poisoning by other antidepressants, intentional self-harm, initial encounter: Principal | ICD-10-CM | POA: Diagnosis present

## 2023-11-21 DIAGNOSIS — F410 Panic disorder [episodic paroxysmal anxiety] without agoraphobia: Secondary | ICD-10-CM | POA: Diagnosis present

## 2023-11-21 DIAGNOSIS — G928 Other toxic encephalopathy: Secondary | ICD-10-CM | POA: Diagnosis present

## 2023-11-21 DIAGNOSIS — Z87891 Personal history of nicotine dependence: Secondary | ICD-10-CM

## 2023-11-21 DIAGNOSIS — Z79899 Other long term (current) drug therapy: Secondary | ICD-10-CM | POA: Diagnosis not present

## 2023-11-21 DIAGNOSIS — J9600 Acute respiratory failure, unspecified whether with hypoxia or hypercapnia: Secondary | ICD-10-CM

## 2023-11-21 DIAGNOSIS — E119 Type 2 diabetes mellitus without complications: Secondary | ICD-10-CM

## 2023-11-21 DIAGNOSIS — Z9102 Food additives allergy status: Secondary | ICD-10-CM

## 2023-11-21 DIAGNOSIS — G4700 Insomnia, unspecified: Secondary | ICD-10-CM | POA: Diagnosis present

## 2023-11-21 DIAGNOSIS — Z91011 Allergy to milk products: Secondary | ICD-10-CM

## 2023-11-21 DIAGNOSIS — Z7951 Long term (current) use of inhaled steroids: Secondary | ICD-10-CM

## 2023-11-21 DIAGNOSIS — Z7984 Long term (current) use of oral hypoglycemic drugs: Secondary | ICD-10-CM

## 2023-11-21 DIAGNOSIS — F603 Borderline personality disorder: Secondary | ICD-10-CM | POA: Diagnosis present

## 2023-11-21 DIAGNOSIS — Z794 Long term (current) use of insulin: Secondary | ICD-10-CM

## 2023-11-21 DIAGNOSIS — Z91048 Other nonmedicinal substance allergy status: Secondary | ICD-10-CM

## 2023-11-21 DIAGNOSIS — T50902A Poisoning by unspecified drugs, medicaments and biological substances, intentional self-harm, initial encounter: Secondary | ICD-10-CM | POA: Diagnosis not present

## 2023-11-21 DIAGNOSIS — T461X2A Poisoning by calcium-channel blockers, intentional self-harm, initial encounter: Secondary | ICD-10-CM | POA: Diagnosis present

## 2023-11-21 DIAGNOSIS — Z8249 Family history of ischemic heart disease and other diseases of the circulatory system: Secondary | ICD-10-CM

## 2023-11-21 DIAGNOSIS — Z6837 Body mass index (BMI) 37.0-37.9, adult: Secondary | ICD-10-CM

## 2023-11-21 DIAGNOSIS — Z79890 Hormone replacement therapy (postmenopausal): Secondary | ICD-10-CM

## 2023-11-21 DIAGNOSIS — E66812 Obesity, class 2: Secondary | ICD-10-CM | POA: Diagnosis present

## 2023-11-21 DIAGNOSIS — Z9911 Dependence on respirator [ventilator] status: Secondary | ICD-10-CM | POA: Diagnosis not present

## 2023-11-21 DIAGNOSIS — Z91018 Allergy to other foods: Secondary | ICD-10-CM

## 2023-11-21 DIAGNOSIS — E039 Hypothyroidism, unspecified: Secondary | ICD-10-CM | POA: Diagnosis present

## 2023-11-21 DIAGNOSIS — E282 Polycystic ovarian syndrome: Secondary | ICD-10-CM | POA: Diagnosis present

## 2023-11-21 DIAGNOSIS — Z91012 Allergy to eggs: Secondary | ICD-10-CM

## 2023-11-21 DIAGNOSIS — J45909 Unspecified asthma, uncomplicated: Secondary | ICD-10-CM | POA: Diagnosis present

## 2023-11-21 DIAGNOSIS — T424X2A Poisoning by benzodiazepines, intentional self-harm, initial encounter: Secondary | ICD-10-CM | POA: Diagnosis present

## 2023-11-21 DIAGNOSIS — Z888 Allergy status to other drugs, medicaments and biological substances status: Secondary | ICD-10-CM

## 2023-11-21 DIAGNOSIS — E0590 Thyrotoxicosis, unspecified without thyrotoxic crisis or storm: Secondary | ICD-10-CM | POA: Diagnosis present

## 2023-11-21 DIAGNOSIS — Z608 Other problems related to social environment: Secondary | ICD-10-CM | POA: Diagnosis present

## 2023-11-21 DIAGNOSIS — Z8349 Family history of other endocrine, nutritional and metabolic diseases: Secondary | ICD-10-CM

## 2023-11-21 DIAGNOSIS — Z91013 Allergy to seafood: Secondary | ICD-10-CM | POA: Diagnosis not present

## 2023-11-21 LAB — I-STAT CHEM 8, ED
BUN: 8 mg/dL (ref 6–20)
Calcium, Ion: 1.13 mmol/L — ABNORMAL LOW (ref 1.15–1.40)
Chloride: 98 mmol/L (ref 98–111)
Creatinine, Ser: 1 mg/dL (ref 0.44–1.00)
Glucose, Bld: 296 mg/dL — ABNORMAL HIGH (ref 70–99)
HCT: 46 % (ref 36.0–46.0)
Hemoglobin: 15.6 g/dL — ABNORMAL HIGH (ref 12.0–15.0)
Potassium: 3.5 mmol/L (ref 3.5–5.1)
Sodium: 134 mmol/L — ABNORMAL LOW (ref 135–145)
TCO2: 25 mmol/L (ref 22–32)

## 2023-11-21 LAB — GLUCOSE, CAPILLARY
Glucose-Capillary: 118 mg/dL — ABNORMAL HIGH (ref 70–99)
Glucose-Capillary: 120 mg/dL — ABNORMAL HIGH (ref 70–99)
Glucose-Capillary: 128 mg/dL — ABNORMAL HIGH (ref 70–99)
Glucose-Capillary: 216 mg/dL — ABNORMAL HIGH (ref 70–99)
Glucose-Capillary: 343 mg/dL — ABNORMAL HIGH (ref 70–99)

## 2023-11-21 LAB — I-STAT CG4 LACTIC ACID, ED: Lactic Acid, Venous: 1 mmol/L (ref 0.5–1.9)

## 2023-11-21 LAB — HCG, SERUM, QUALITATIVE: Preg, Serum: NEGATIVE

## 2023-11-21 LAB — COMPREHENSIVE METABOLIC PANEL WITH GFR
ALT: 70 U/L — ABNORMAL HIGH (ref 0–44)
AST: 40 U/L (ref 15–41)
Albumin: 4.4 g/dL (ref 3.5–5.0)
Alkaline Phosphatase: 88 U/L (ref 38–126)
Anion gap: 12 (ref 5–15)
BUN: 9 mg/dL (ref 6–20)
CO2: 23 mmol/L (ref 22–32)
Calcium: 9.3 mg/dL (ref 8.9–10.3)
Chloride: 95 mmol/L — ABNORMAL LOW (ref 98–111)
Creatinine, Ser: 0.98 mg/dL (ref 0.44–1.00)
GFR, Estimated: >60 mL/min (ref >60)
Glucose, Bld: 285 mg/dL — ABNORMAL HIGH (ref 70–99)
Potassium: 3.2 mmol/L — ABNORMAL LOW (ref 3.5–5.1)
Sodium: 130 mmol/L — ABNORMAL LOW (ref 135–145)
Total Bilirubin: 1.1 mg/dL (ref 0.0–1.2)
Total Protein: 8 g/dL (ref 6.5–8.1)

## 2023-11-21 LAB — CBC WITH DIFFERENTIAL/PLATELET
Abs Immature Granulocytes: 0.1 10*3/uL — ABNORMAL HIGH (ref 0.00–0.07)
Basophils Absolute: 0.1 10*3/uL (ref 0.0–0.1)
Basophils Relative: 1 %
Eosinophils Absolute: 0.2 10*3/uL (ref 0.0–0.5)
Eosinophils Relative: 1 %
HCT: 43 % (ref 36.0–46.0)
Hemoglobin: 14.3 g/dL (ref 12.0–15.0)
Immature Granulocytes: 1 %
Lymphocytes Relative: 13 %
Lymphs Abs: 2.3 10*3/uL (ref 0.7–4.0)
MCH: 29.9 pg (ref 26.0–34.0)
MCHC: 33.3 g/dL (ref 30.0–36.0)
MCV: 90 fL (ref 80.0–100.0)
Monocytes Absolute: 1.7 10*3/uL — ABNORMAL HIGH (ref 0.1–1.0)
Monocytes Relative: 10 %
Neutro Abs: 13.2 10*3/uL — ABNORMAL HIGH (ref 1.7–7.7)
Neutrophils Relative %: 74 %
Platelets: 335 10*3/uL (ref 150–400)
RBC: 4.78 MIL/uL (ref 3.87–5.11)
RDW: 13.3 % (ref 11.5–15.5)
WBC: 17.5 10*3/uL — ABNORMAL HIGH (ref 4.0–10.5)
nRBC: 0 % (ref 0.0–0.2)

## 2023-11-21 LAB — LACTIC ACID, PLASMA: Lactic Acid, Venous: 2.9 mmol/L (ref 0.5–1.9)

## 2023-11-21 LAB — CK: Total CK: 122 U/L (ref 38–234)

## 2023-11-21 LAB — BLOOD GAS, ARTERIAL
Acid-base deficit: 3 mmol/L — ABNORMAL HIGH (ref 0.0–2.0)
Bicarbonate: 22.7 mmol/L (ref 20.0–28.0)
Drawn by: 21338
FIO2: 100 %
MECHVT: 400 mL
O2 Saturation: 100 %
PEEP: 5 cmH2O
Patient temperature: 36.9
RATE: 18 {breaths}/min
pCO2 arterial: 42 mmHg (ref 32–48)
pH, Arterial: 7.34 — ABNORMAL LOW (ref 7.35–7.45)
pO2, Arterial: 446 mmHg — ABNORMAL HIGH (ref 83–108)

## 2023-11-21 LAB — ACETAMINOPHEN LEVEL
Acetaminophen (Tylenol), Serum: <10 ug/mL — ABNORMAL LOW (ref 10–30)
Acetaminophen (Tylenol), Serum: <10 ug/mL — ABNORMAL LOW (ref 10–30)

## 2023-11-21 LAB — RAPID URINE DRUG SCREEN, HOSP PERFORMED
Amphetamines: NOT DETECTED
Barbiturates: NOT DETECTED
Benzodiazepines: POSITIVE — AB
Cocaine: NOT DETECTED
Opiates: NOT DETECTED
Tetrahydrocannabinol: POSITIVE — AB

## 2023-11-21 LAB — SALICYLATE LEVEL: Salicylate Lvl: <7 mg/dL — ABNORMAL LOW (ref 7.0–30.0)

## 2023-11-21 LAB — MAGNESIUM: Magnesium: 2.3 mg/dL (ref 1.7–2.4)

## 2023-11-21 LAB — HEMOGLOBIN A1C
Hgb A1c MFr Bld: 4.9 % (ref 4.8–5.6)
Mean Plasma Glucose: 93.93 mg/dL

## 2023-11-21 LAB — CBG MONITORING, ED: Glucose-Capillary: 298 mg/dL — ABNORMAL HIGH (ref 70–99)

## 2023-11-21 LAB — MRSA NEXT GEN BY PCR, NASAL: MRSA by PCR Next Gen: NOT DETECTED

## 2023-11-21 LAB — ETHANOL: Alcohol, Ethyl (B): <15 mg/dL (ref <15)

## 2023-11-21 LAB — T4, FREE: Free T4: 1.03 ng/dL (ref 0.61–1.12)

## 2023-11-21 LAB — TSH: TSH: 0.704 u[IU]/mL (ref 0.350–4.500)

## 2023-11-21 MED ORDER — ROCURONIUM BROMIDE 10 MG/ML (PF) SYRINGE: 100.0000 mg | PREFILLED_SYRINGE | Freq: Once | INTRAVENOUS | Status: AC

## 2023-11-21 MED ORDER — ROCURONIUM BROMIDE 10 MG/ML (PF) SYRINGE
PREFILLED_SYRINGE | INTRAVENOUS | Status: AC
  Administered 2023-11-21: 100 mg via INTRAVENOUS
  Filled 2023-11-21: qty 10

## 2023-11-21 MED ORDER — MIDAZOLAM HCL 2 MG/2ML IJ SOLN: 1.0000 mg | INTRAMUSCULAR | Status: DC | PRN

## 2023-11-21 MED ORDER — FENTANYL BOLUS VIA INFUSION: 50.0000 ug | INTRAVENOUS | Status: DC | PRN

## 2023-11-21 MED ORDER — FENTANYL 2500MCG IN NS 250ML (10MCG/ML) PREMIX INFUSION
0.0000 ug/h | INTRAVENOUS | Status: DC
Start: 2023-11-21 — End: 2023-11-21
  Administered 2023-11-21: 50 ug/h via INTRAVENOUS
  Filled 2023-11-21: qty 250

## 2023-11-21 MED ORDER — INSULIN GLARGINE-YFGN 100 UNIT/ML ~~LOC~~ SOLN
20.0000 [IU] | Freq: Every day | SUBCUTANEOUS | Status: DC
  Administered 2023-11-21 – 2023-11-22 (×2): 20 [IU] via SUBCUTANEOUS
  Filled 2023-11-21 (×3): qty 0.2

## 2023-11-21 MED ORDER — ACETAMINOPHEN 650 MG RE SUPP
650.0000 mg | RECTAL | Status: DC | PRN
  Administered 2023-11-21: 650 mg via RECTAL
  Filled 2023-11-21: qty 1

## 2023-11-21 MED ORDER — POTASSIUM CHLORIDE 2 MEQ/ML IV SOLN
INTRAVENOUS | Status: AC
  Filled 2023-11-21 (×4): qty 1000

## 2023-11-21 MED ORDER — FENTANYL CITRATE PF 50 MCG/ML IJ SOSY: 50.0000 ug | PREFILLED_SYRINGE | Freq: Once | INTRAMUSCULAR | Status: DC

## 2023-11-21 MED ORDER — CHARCOAL ACTIVATED PO LIQD
50.0000 g | Freq: Once | ORAL | Status: AC
  Administered 2023-11-21: 50 g
  Filled 2023-11-21: qty 240

## 2023-11-21 MED ORDER — POLYETHYLENE GLYCOL 3350 17 G PO PACK: 17.0000 g | PACK | Freq: Every day | ORAL | Status: DC | PRN

## 2023-11-21 MED ORDER — LORAZEPAM 2 MG/ML IJ SOLN
INTRAMUSCULAR | Status: AC
  Administered 2023-11-21: 2 mg via INTRAVENOUS
  Filled 2023-11-21: qty 1

## 2023-11-21 MED ORDER — ETOMIDATE 2 MG/ML IV SOLN
20.0000 mg | Freq: Once | INTRAVENOUS | Status: AC
  Administered 2023-11-21: 20 mg via INTRAVENOUS

## 2023-11-21 MED ORDER — IPRATROPIUM-ALBUTEROL 0.5-2.5 (3) MG/3ML IN SOLN
3.0000 mL | Freq: Four times a day (QID) | RESPIRATORY_TRACT | Status: DC | PRN
  Administered 2023-11-22: 3 mL via RESPIRATORY_TRACT
  Filled 2023-11-21: qty 3

## 2023-11-21 MED ORDER — FENTANYL BOLUS VIA INFUSION
100.0000 ug | INTRAVENOUS | Status: DC | PRN
  Administered 2023-11-21 – 2023-11-22 (×13): 100 ug via INTRAVENOUS

## 2023-11-21 MED ORDER — CHARCOAL ACTIVATED PO LIQD: 50.0000 g | Freq: Once | ORAL | Status: DC

## 2023-11-21 MED ORDER — CHLORHEXIDINE GLUCONATE CLOTH 2 % EX PADS
6.0000 | MEDICATED_PAD | Freq: Every day | CUTANEOUS | Status: DC
  Administered 2023-11-21 – 2023-11-23 (×3): 6 via TOPICAL

## 2023-11-21 MED ORDER — ENOXAPARIN SODIUM 40 MG/0.4ML IJ SOSY
40.0000 mg | PREFILLED_SYRINGE | INTRAMUSCULAR | Status: DC
  Administered 2023-11-21 – 2023-11-23 (×3): 40 mg via SUBCUTANEOUS
  Filled 2023-11-21 (×4): qty 0.4

## 2023-11-21 MED ORDER — ORAL CARE MOUTH RINSE: 15.0000 mL | OROMUCOSAL | Status: DC | PRN

## 2023-11-21 MED ORDER — INSULIN ASPART 100 UNIT/ML IJ SOLN
0.0000 [IU] | INTRAMUSCULAR | Status: DC
  Administered 2023-11-21: 11 [IU] via SUBCUTANEOUS
  Administered 2023-11-21: 5 [IU] via SUBCUTANEOUS
  Administered 2023-11-21 (×2): 2 [IU] via SUBCUTANEOUS
  Administered 2023-11-22 (×2): 3 [IU] via SUBCUTANEOUS
  Administered 2023-11-22: 2 [IU] via SUBCUTANEOUS
  Administered 2023-11-22: 3 [IU] via SUBCUTANEOUS
  Administered 2023-11-22 – 2023-11-23 (×2): 2 [IU] via SUBCUTANEOUS
  Filled 2023-11-21: qty 0.15

## 2023-11-21 MED ORDER — LORAZEPAM 2 MG/ML IJ SOLN: 2.0000 mg | Freq: Once | INTRAMUSCULAR | Status: AC

## 2023-11-21 MED ORDER — FAMOTIDINE 20 MG PO TABS
20.0000 mg | ORAL_TABLET | Freq: Two times a day (BID) | ORAL | Status: DC
  Administered 2023-11-21: 20 mg
  Filled 2023-11-21: qty 1

## 2023-11-21 MED ORDER — ETOMIDATE 2 MG/ML IV SOLN
INTRAVENOUS | Status: AC
  Filled 2023-11-21: qty 20

## 2023-11-21 MED ORDER — ETOMIDATE 2 MG/ML IV SOLN
INTRAVENOUS | Status: AC
  Filled 2023-11-21: qty 10

## 2023-11-21 MED ORDER — MIDAZOLAM HCL 2 MG/2ML IJ SOLN
2.0000 mg | INTRAMUSCULAR | Status: DC | PRN
  Administered 2023-11-21 (×4): 2 mg via INTRAVENOUS
  Filled 2023-11-21 (×4): qty 2

## 2023-11-21 MED ORDER — ORAL CARE MOUTH RINSE
15.0000 mL | OROMUCOSAL | Status: DC
  Administered 2023-11-21 – 2023-11-22 (×15): 15 mL via OROMUCOSAL

## 2023-11-21 MED ORDER — ONDANSETRON HCL 4 MG/2ML IJ SOLN
4.0000 mg | Freq: Four times a day (QID) | INTRAMUSCULAR | Status: DC | PRN
  Administered 2023-11-24: 4 mg via INTRAVENOUS
  Filled 2023-11-21: qty 2

## 2023-11-21 MED ORDER — FAMOTIDINE IN NACL 20-0.9 MG/50ML-% IV SOLN
20.0000 mg | Freq: Two times a day (BID) | INTRAVENOUS | Status: DC
  Administered 2023-11-21 – 2023-11-22 (×3): 20 mg via INTRAVENOUS
  Filled 2023-11-21 (×4): qty 50

## 2023-11-21 MED ORDER — SUCCINYLCHOLINE CHLORIDE 200 MG/10ML IV SOSY
PREFILLED_SYRINGE | INTRAVENOUS | Status: DC
Start: 2023-11-21 — End: 2023-11-21
  Filled 2023-11-21: qty 10

## 2023-11-21 MED ORDER — BUDESONIDE 0.5 MG/2ML IN SUSP
0.5000 mg | Freq: Two times a day (BID) | RESPIRATORY_TRACT | Status: DC
  Administered 2023-11-21 – 2023-11-24 (×7): 0.5 mg via RESPIRATORY_TRACT
  Filled 2023-11-21 (×8): qty 2

## 2023-11-21 MED ORDER — ACETAMINOPHEN 325 MG PO TABS
650.0000 mg | ORAL_TABLET | ORAL | Status: DC | PRN
  Administered 2023-11-22 – 2023-11-23 (×2): 650 mg via ORAL
  Filled 2023-11-21 (×2): qty 2

## 2023-11-21 MED ORDER — FENTANYL 2500MCG IN NS 250ML (10MCG/ML) PREMIX INFUSION
0.0000 ug/h | INTRAVENOUS | Status: DC
  Administered 2023-11-21 – 2023-11-22 (×2): 300 ug/h via INTRAVENOUS
  Filled 2023-11-21 (×2): qty 250

## 2023-11-21 MED ORDER — DOCUSATE SODIUM 100 MG PO CAPS: 100.0000 mg | ORAL_CAPSULE | Freq: Two times a day (BID) | ORAL | Status: DC | PRN

## 2023-11-21 MED ORDER — POTASSIUM CHLORIDE 10 MEQ/100ML IV SOLN
10.0000 meq | Freq: Once | INTRAVENOUS | Status: AC
  Administered 2023-11-21: 10 meq via INTRAVENOUS
  Filled 2023-11-21: qty 100

## 2023-11-21 MED ORDER — DEXMEDETOMIDINE HCL IN NACL 200 MCG/50ML IV SOLN
0.0000 ug/kg/h | INTRAVENOUS | Status: DC
  Administered 2023-11-21 (×2): 0.4 ug/kg/h via INTRAVENOUS
  Filled 2023-11-21 (×2): qty 50

## 2023-11-21 MED ORDER — ARFORMOTEROL TARTRATE 15 MCG/2ML IN NEBU
15.0000 ug | INHALATION_SOLUTION | Freq: Two times a day (BID) | RESPIRATORY_TRACT | Status: DC
  Administered 2023-11-21 – 2023-11-24 (×7): 15 ug via RESPIRATORY_TRACT
  Filled 2023-11-21 (×8): qty 2

## 2023-11-21 NOTE — ED Notes (Signed)
 Father arrived with stepmom at bedside.

## 2023-11-21 NOTE — Progress Notes (Addendum)
 Patient's clothing, earrings, and medications sent home with patient's wife Rebekah.
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
## 2023-11-21 NOTE — Progress Notes (Signed)
 Pt transported on vent from ED to ICU room 1227.  Pt arousable at times.  PT is on same vent settings. PRVC, R 18, +5peep and 40% now after ABG results.  ICU RT aware.

## 2023-11-21 NOTE — ED Notes (Signed)
 Patient starting waking up throwing up the charcoal.

## 2023-11-21 NOTE — Progress Notes (Signed)
 Patient has remained hypertensive throughout the day following OD. Per poison control, they do not recommend treating HTN unless they are stroke level high. If an antihypertensive needs to be administered, they recommend Nipride as it is short acting. Current SBP is fluctuating between 150-180.

## 2023-11-21 NOTE — ED Notes (Incomplete)
 Spoke with Bonnell Butcher, RN at [facility].  Recommendation for Activate Charcoal without sorbitol down OG tube now.  Watch for hypotension and bradycardia with respiratory depression for xanax , clonidine , tizadine, and procardia xL.

## 2023-11-21 NOTE — Progress Notes (Addendum)
 eLink Physician-Brief Progress Note Patient Name: Carol Paul DOB: 1996/04/26 MRN: [id number]   Date of Service  11/21/2023  HPI/Events of Note  Patient extremely agitated and at risk of self-extubation. Propofol flagged by EPIC due to history of anaphylaxis with exposure to eggs and soy products.  eICU Interventions  Fentanyl  gtt rate increased to 300 mcg after a bolus and Fentanyl  gtt ceiling increased to 400 mcg. Precedex gtt ordered. Patient likely has tolerance to benzodiazepines from chronic Xanax  therapy  (evidenced by apparent lack of efficacy of Versed pushes).        [name] U Shainna Faux 11/21/2023, 8:08 PM

## 2023-11-21 NOTE — ED Triage Notes (Signed)
 Arrived [facility] for intentional OD, wife reports pt woke her at 0500 saying she took multiple medications last night, and more medications this morning before becoming unresponsive. Pt lethargic upon arrival.     PTA 22 LAC CBG 214

## 2023-11-21 NOTE — ED Notes (Addendum)
 Patient's sister called requesting update she is not on emergency contact but father is on list.

## 2023-11-21 NOTE — ED Provider Notes (Signed)
 [place] EMERGENCY DEPARTMENT AT [facility] Provider Note   CSN: [id number] Arrival date & time: 11/21/23  0630     History  Chief Complaint  Patient presents with   Drug Overdose   Suicide Attempt    Carol Paul is a 28 y.o. female.  27 yo F with a cc of SI.  Patient had reportedly took all of her home  medications in an attempt to end her life.  She had woken of her roommate about 5 AM and told her that she had taken lots of things to try and kill herself and then reportedly went back and started taking more medications.  Patient's Xanax  prescription which was recently filled was completely empty as well as prescription for tizanidine and nifedipine.  Patient is not really speaking on arrival to the ED.  Level 5 caveat.        Home Medications Prior to Admission medications   Medication Sig Start Date End Date Taking? Authorizing Provider  albuterol  (PROVENTIL ) (2.5 MG/3ML) 0.083% nebulizer solution Take 3 mLs (2.5 mg total) by nebulization every 6 (six) hours as needed for wheezing or shortness of breath. 01/15/23   Teena Feast, MD  albuterol  (VENTOLIN  HFA) [phone] Base) MCG/ACT inhaler Inhale 1-2 puffs into the lungs every 4 (four) hours as needed for wheezing or shortness of breath. 01/15/23   Teena Feast, MD  alprazolam  (XANAX ) 2 MG tablet Take 1 mg by mouth every 8 (eight) hours as needed for anxiety.    [provider]  cloNIDine  (CATAPRES ) 0.1 MG tablet Take 0.1 mg by mouth at bedtime.    [provider]  diphenhydrAMINE  (BENADRYL ) 25 MG tablet Take 50 mg by mouth daily as needed (following anaphylactic reaction).    [provider]  EPINEPHrine  0.3 mg/0.3 mL IJ SOAJ injection Inject 0.3 mg into the muscle as needed for anaphylaxis. 04/28/23   Davis, Jonathon H, MD  famotidine  (PEPCID ) 20 MG tablet Take 20 mg by mouth 2 (two) times daily.    [provider]  hydrOXYzine  (ATARAX ) 25 MG tablet Take 25 mg by  mouth 3 (three) times daily as needed for anxiety.    [provider]  insulin  aspart (NOVOLOG ) 100 UNIT/ML injection Inject 25 Units into the skin 3 (three) times daily before meals.    [provider]  insulin  glargine (LANTUS SOLOSTAR) 100 UNIT/ML Solostar Pen Inject 20 Units into the skin at bedtime.    [provider]  labetalol  (NORMODYNE ) 100 MG tablet Take 1 tablet (100 mg total) by mouth 2 (two) times daily. Patient taking differently: Take 100 mg by mouth 3 (three) times daily. 01/17/23   Rasch, Bridgette Campus I, NP  levothyroxine  (SYNTHROID ) 25 MCG tablet Take 25-50 mcg by mouth See admin instructions. Take 25 mcg (1 tablet) once daily before breakfast, except take 50 mcg (2 tablets) before breakfast on Monday and Friday.    [provider]  ondansetron  (ZOFRAN ) 4 MG tablet Take 4 mg by mouth every 8 (eight) hours as needed for nausea or vomiting.    [provider]  ondansetron  (ZOFRAN -ODT) 4 MG disintegrating tablet Take 1 tablet (4 mg total) by mouth every 8 (eight) hours as needed for nausea or vomiting. 03/20/23   Tari Fare, CNM  Prenatal Vit-Fe Fumarate-FA (PRENATAL VITAMIN PO) Take 1 tablet by mouth daily.    [provider]  promethazine  (PHENERGAN ) 25 MG suppository Place 1 suppository (25 mg total) rectally every 6 (six)  hours as needed for nausea or vomiting. 01/15/23   Teena Feast, MD  promethazine  (PHENERGAN ) 25 MG tablet Take 1 tablet (25 mg total) by mouth every 6 (six) hours as needed for nausea or vomiting. 01/15/23   Teena Feast, MD  scopolamine  (TRANSDERM-SCOP) 1 MG/3DAYS Place 1 patch (1.5 mg total) onto the skin every 3 (three) days. 03/20/23   Tari Fare, CNM  SYMBICORT 160-4.5 MCG/ACT inhaler Inhale 2 puffs into the lungs 2 (two) times daily as needed. 09/12/22   [provider]      Allergies    Sesame seed (diagnostic), Soy allergy (obsolete), Soybean oil, Egg-derived products,  Milk (cow), Reglan  [metoclopramide ], and Wheat    Review of Systems   Review of Systems  Physical Exam Updated Vital Signs BP 112/80   Pulse 87   Resp (!) 27   Ht 5\' 2"  (1.575 m)   LMP 12/16/2022   SpO2 100%   BMI 43.90 kg/m  Physical Exam Vitals and nursing note reviewed.  Constitutional:      General: She is not in acute distress.    Appearance: She is well-developed. She is not diaphoretic.  HENT:     Head: Normocephalic and atraumatic.  Eyes:     Pupils: Pupils are equal, round, and reactive to light.     Comments: Patient's pupils are 2 mm and minimally reactive  Cardiovascular:     Rate and Rhythm: Normal rate and regular rhythm.     Heart sounds: No murmur heard.    No friction rub. No gallop.  Pulmonary:     Effort: Pulmonary effort is normal.     Breath sounds: No wheezing or rales.  Abdominal:     General: There is no distension.     Palpations: Abdomen is soft.     Tenderness: There is no abdominal tenderness.  Musculoskeletal:        General: No tenderness.     Cervical back: Normal range of motion and neck supple.  Skin:    General: Skin is warm and dry.  Neurological:     Comments: Initially mumbling to voice and then rapidly not responding to pain.     ED Results / Procedures / Treatments   Labs (all labs ordered are listed, but only abnormal results are displayed) Labs Reviewed  CBC WITH DIFFERENTIAL/PLATELET - Abnormal; Notable for the following components:      Result Value   WBC 17.5 (*)    Neutro Abs 13.2 (*)    Monocytes Absolute 1.7 (*)    Abs Immature Granulocytes 0.10 (*)    All other components within normal limits  CBG MONITORING, ED - Abnormal; Notable for the following components:   Glucose-Capillary 298 (*)    All other components within normal limits  I-STAT CHEM 8, ED - Abnormal; Notable for the following components:   Sodium 134 (*)    Glucose, Bld 296 (*)    Calcium , Ion 1.13 (*)    Hemoglobin 15.6 (*)    All other  components within normal limits  COMPREHENSIVE METABOLIC PANEL WITH GFR  ETHANOL  RAPID URINE DRUG SCREEN, HOSP PERFORMED  HCG, SERUM, QUALITATIVE  SALICYLATE LEVEL  ACETAMINOPHEN  LEVEL  I-STAT CG4 LACTIC ACID, ED    EKG EKG Interpretation Date/Time:  Friday Nov 21 2023 06:43:27 EDT Ventricular Rate:  87 PR Interval:  143 QRS Duration:  85 QT Interval:  364 QTC Calculation: 438 R Axis:   34  Text Interpretation: Sinus rhythm Low  voltage, precordial leads Nonspecific T abnormalities, anterior leads No significant change since last tracing Confirmed by Albertus Hughs [phone]) on 11/21/2023 7:02:18 AM  Radiology No results found.  Procedures .Critical Care  Performed by: Albertus Hughs, DO Authorized by: Albertus Hughs, DO   Critical care provider statement:    Critical care time (minutes):  35   Critical care time was exclusive of:  Separately billable procedures and treating other patients   Critical care was time spent personally by me on the following activities:  Development of treatment plan with patient or surrogate, discussions with consultants, evaluation of patient's response to treatment, examination of patient, ordering and review of laboratory studies, ordering and review of radiographic studies, ordering and performing treatments and interventions, pulse oximetry, re-evaluation of patient's condition and review of old charts   Care discussed with: admitting provider   Procedure Name: Intubation Date/Time: 11/21/2023 7:00 AM  Performed by: Albertus Hughs, DOPre-anesthesia Checklist: Patient identified, Patient being monitored, Emergency Drugs available, Timeout performed and Suction available Oxygen Delivery Method: Non-rebreather mask Preoxygenation: Pre-oxygenation with 100% oxygen Induction Type: Rapid sequence Ventilation: Mask ventilation without difficulty Laryngoscope Size: Glidescope Grade View: Grade I Tube size: 7.5 mm Number of attempts: 1 Airway Equipment and Method:  Video-laryngoscopy Placement Confirmation: ETT inserted through vocal cords under direct vision, CO2 detector and Breath sounds checked- equal and bilateral Secured at: 23 cm Tube secured with: ETT holder Dental Injury: Injury to lip  Difficulty Due To: Difficulty was anticipated Future Recommendations: Recommend- induction with short-acting agent, and alternative techniques readily available        Medications Ordered in ED Medications  succinylcholine (ANECTINE) 200 MG/10ML syringe (has no administration in time range)  etomidate (AMIDATE) 2 MG/ML injection (has no administration in time range)  etomidate (AMIDATE) 2 MG/ML injection (has no administration in time range)  fentaNYL  (SUBLIMAZE ) injection 50 mcg (has no administration in time range)  fentaNYL  in NS (47mcg/ml) infusion-PREMIX (has no administration in time range)  fentaNYL  (SUBLIMAZE ) bolus via infusion 50-100 mcg (has no administration in time range)  midazolam (VERSED) injection 1-2 mg (has no administration in time range)  etomidate (AMIDATE) injection 20 mg (20 mg Intravenous Given 11/21/23 0643)  rocuronium (ZEMURON) injection 100 mg (100 mg Intravenous Given 11/21/23 6213)    ED Course/ Medical Decision Making/ A&P                                 Medical Decision Making Amount and/or Complexity of Data Reviewed Labs: ordered. Radiology: ordered.  Risk Prescription drug management.   28 yo F with a chief complaints of an intentional drug overdose.  The patient had woken up her roommate about 5 AM and told her that she had taken multiple medications in an attempt to end her life.  EMS brought multiple pill bottles here including he recently filled prescription for Xanax  that was completely empty as well as empty bottles of clonidine  nifedipine.  Patient reportedly was awake and alert and just a bit sleepy with an EMS who picked her up but had rapidly worsened en route.  Patient with up to 20  seconds of apnea.  2 mg of IV Narcan given without improvement.  Patient intubated for respiratory protection.  Will discuss with ICU.  The patients results and plan were reviewed and discussed.   Any x-rays performed were independently reviewed by myself.   Differential diagnosis were considered with the presenting  HPI.  Medications  succinylcholine (ANECTINE) 200 MG/10ML syringe (has no administration in time range)  etomidate (AMIDATE) 2 MG/ML injection (has no administration in time range)  etomidate (AMIDATE) 2 MG/ML injection (has no administration in time range)  fentaNYL  (SUBLIMAZE ) injection 50 mcg (has no administration in time range)  fentaNYL  in NS (10mcg/ml) infusion-PREMIX (has no administration in time range)  fentaNYL  (SUBLIMAZE ) bolus via infusion 50-100 mcg (has no administration in time range)  midazolam (VERSED) injection 1-2 mg (has no administration in time range)  etomidate (AMIDATE) injection 20 mg (20 mg Intravenous Given 11/21/23 0643)  rocuronium (ZEMURON) injection 100 mg (100 mg Intravenous Given 11/21/23 0644)    Vitals:   11/21/23 0642  BP: 112/80  Pulse: 87  Resp: (!) 27  SpO2: 100%  Height: 5\' 2"  (1.575 m)    Final diagnoses:  Intentional overdose, initial encounter [facility])    Admission/ observation were discussed with the admitting physician, patient and/or family and they are comfortable with the plan.          Final Clinical Impression(s) / ED Diagnoses Final diagnoses:  Intentional overdose, initial encounter [facility])    Rx / DC Orders ED Discharge Orders     None         Albertus Hughs, DO 11/21/23 4098

## 2023-11-21 NOTE — Progress Notes (Signed)
 Patient's wife Anola Basques confirmed that patient's father, Reymundo Caulk, is the emergency contact for the patient as well as the medical decision maker. This is reflected in the contact list.

## 2023-11-21 NOTE — H&P (Addendum)
 NAME:  LOWANDA GILNER, MRN:  [id number], DOB:  10/28/1995, LOS: 0 ADMISSION DATE:  11/21/2023, CONSULTATION DATE:  5/16 REFERRING MD:  Inga Manges , CHIEF COMPLAINT:  overdose    History of Present Illness:  28 year old female patient with a history as outlined below specifically with a history of anxiety, depression and bipolar disorder.  On 5/16 patient awoke her roommate telling her she had taken all of her medications and attempted to end her life.  Medications included Xanax , recently filled and completely empty, tizanidine and nifedipine.  She became progressively less responsive and EMS was called.  On arrival to the emergency room she was initially mumbling to voice, then became even more progressively unresponsive.  She did not respond to Narcan.  She was intubated in the emergency room for airway protection. 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    Interim History / Subjective:  Arrived in the ICU.  She did vomit up her charcoal  Objective    Blood pressure (!) 131/97, pulse 70, temperature 98.4 F (36.9 C), resp. rate 20, height 5\' 2"  (1.575 m), last menstrual period 12/16/2022, SpO2 100%, unknown if currently breastfeeding.    Vent Mode: PRVC FiO2 (%):  [100 %] 100 % Set Rate:  [18 bmp] 18 bmp Vt Set:  [400 mL] 400 mL PEEP:  [5 cmH20] 5  cmH20 Plateau Pressure:  [15 cmH20] 15 cmH20  No intake or output data in the 24 hours ending 11/21/23 0826 There were no vitals filed for this visit.  Examination: General: Obese 28 year old female patient currently sedated on full ventilator support HENT: Pupils equal reactive sclera nonicteric orally intubated Lungs: Clear diminished bases portable chest x-ray personally reviewed is without infiltrates Cardiovascular: Bradycardic rhythm regular rate and rhythm without murmur rub or gallop Abdomen: Soft not tender, has an NG in place.  Does appear to have moisture related area of excoriation under her pannus Extremities: Warm dry brisk cap refill Neuro: Opens eyes, agitated with noxious stimulus will reach for endotracheal tube vigorously shaking head no, not really following commands but certainly purposeful no focal deficits noted GU: Clear yellow  Resolved problem list   Assessment and Plan  Intentional polysubstance overdose superimposed on underlying bipolar disease with both anxiety and depression -From the ER list of medications taken include Xanax , tizanidine and nifedipine - EtOH, initial aspirin and Tylenol  levels all negative - Initial QTc 438 Plan Supportive care Telemetry monitoring, with serial QTc monitoring IV hydration She already has 2 alpha agonists on board in the setting of clonidine  and tizanidine.  Currently has as needed fentanyl  and Versed, she is already bradycardic so if additional sedation needed will add propofol Follow-up repeat aspirin and Tylenol  levels Check TSH and free T4 Follow-up UDS Will need psychiatry eval  Acute respiratory failure secondary to inability  to protect airway Post intubation chest x-ray endotracheal tubes in satisfactory position.  Low volume film, no obvious infiltrates Plan Full ventilator support  VAP bundle PAD protocol RASS goal -2 Respiratory culture A.m. chest x-ray Trend fever curve  History of  asthma Plan Nebulized Brovana and budesonide  while on ventilator  Type 2 diabetes, insulin -dependent Plan Sliding scale insulin  Semglee 20 units daily  History of hypothyroidism Plan Continue with Synthroid  replacement via tube pending results of thyroid  studies  History of hypertension Plan Holding her antihypertensives for now Best Practice (right click and "Reselect all SmartList Selections" daily)   Diet/type: NPO DVT prophylaxis LMWH Pressure ulcer(s):  see nursing documentation  GI prophylaxis: H2B Lines: N/A Foley:  Yes, and it is still needed Code Status:  full code Last date of multidisciplinary goals of care discussion [pending]  Labs   CBC: Recent Labs  Lab 11/21/23 0636 11/21/23 0650  WBC 17.5*  --   NEUTROABS 13.2*  --   HGB 14.3 15.6*  HCT 43.0 46.0  MCV 90.0  --   PLT 335  --     Basic Metabolic Panel: Recent Labs  Lab 11/21/23 0636 11/21/23 0650  NA 130* 134*  K 3.2* 3.5  CL 95* 98  CO2 23  --   GLUCOSE 285* 296*  BUN 9 8  CREATININE 0.98 1.00  CALCIUM  9.3  --    GFR: CrCl cannot be calculated (Unknown ideal weight.). Recent Labs  Lab 11/21/23 0636 11/21/23 0645  WBC 17.5*  --   LATICACIDVEN  --  1.0    Liver Function Tests: Recent Labs  Lab 11/21/23 0636  AST 40  ALT 70*  ALKPHOS 88  BILITOT 1.1  PROT 8.0  ALBUMIN 4.4   No results for input(s): "LIPASE", "AMYLASE" in the last 168 hours. No results for input(s): "AMMONIA" in the last 168 hours.  ABG    Component Value Date/Time   PHART 7.34 (L) 11/21/2023 0745   PCO2ART 42 11/21/2023 0745   PO2ART 446 (H) 11/21/2023 0745   HCO3 22.7 11/21/2023 0745   TCO2 25 11/21/2023 0650   ACIDBASEDEF 3.0 (H) 11/21/2023 0745   O2SAT 100 11/21/2023 0745     Coagulation Profile: No results for input(s): "INR", "PROTIME" in the last 168 hours.  Cardiac Enzymes: No results for input(s): "CKTOTAL", "CKMB", "CKMBINDEX", "TROPONINI" in the last 168 hours.  HbA1C: Hgb A1c MFr  Bld  Date/Time Value Ref Range Status  01/15/2023 09:20 AM 6.1 (H) 4.8 - 5.6 % Final    Comment:    (NOTE) Pre diabetes:          5.7%-6.4%  Diabetes:              >6.4%  Glycemic control for   <7.0% adults with diabetes   12/02/2022 04:30 AM 6.0 (H) 4.8 - 5.6 % Final    Comment:    (NOTE)         Prediabetes: 5.7 - 6.4         Diabetes: >6.4         Glycemic control for adults with diabetes: <7.0     CBG: Recent Labs  Lab 11/21/23 0635  GLUCAP 298*    Review of Systems:   Not able   Past Medical History:  She,  has a past medical history of Anxiety, Asthma, Diabetes mellitus without complication (HCC), Hypertension, Hyperthyroidism, Ovarian cyst, and UTI (urinary tract infection).   Surgical History:   Past Surgical History:  Procedure Laterality Date   ANKLE SURGERY Left    TONSILLECTOMY       Social History:   reports that she has quit  smoking. She has been exposed to tobacco smoke. She has never used smokeless tobacco. She reports that she does not currently use alcohol. She reports that she does not currently use drugs after having used the following drugs: Marijuana.   Family History:  Her family history includes CAD in an other family member; Healthy in her mother; Hypothyroidism in her father.   Allergies Allergies  Allergen Reactions   Sesame Seed (Diagnostic) Anaphylaxis   Soy Allergy (Obsolete) Anaphylaxis   Soybean Oil Anaphylaxis   Egg-Derived Products Diarrhea, Nausea Only and Other (See Comments)    GI discomfort   Milk (Cow) Diarrhea, Nausea Only and Other (See Comments)    GI discomfort   Reglan  [Metoclopramide ] Swelling    Face & Extremeties   Wheat Diarrhea, Nausea Only and Other (See Comments)    GI discomfort     Home Medications  Prior to Admission medications   Medication Sig Start Date End Date Taking? Authorizing Provider  albuterol  (PROVENTIL ) (2.5 MG/3ML) 0.083% nebulizer solution Take 3 mLs (2.5 mg total) by nebulization  every 6 (six) hours as needed for wheezing or shortness of breath. 01/15/23   Teena Feast, MD  albuterol  (VENTOLIN  HFA) 108 [phone] Base) MCG/ACT inhaler Inhale 1-2 puffs into the lungs every 4 (four) hours as needed for wheezing or shortness of breath. 01/15/23   Teena Feast, MD  alprazolam  (XANAX ) 2 MG tablet Take 1 mg by mouth every 8 (eight) hours as needed for anxiety.    [provider]  cloNIDine  (CATAPRES ) 0.1 MG tablet Take 0.1 mg by mouth at bedtime.    [provider]  diphenhydrAMINE  (BENADRYL ) 25 MG tablet Take 50 mg by mouth daily as needed (following anaphylactic reaction).    [provider]  EPINEPHrine  0.3 mg/0.3 mL IJ SOAJ injection Inject 0.3 mg into the muscle as needed for anaphylaxis. 04/28/23   Davis, Jonathon H, MD  famotidine  (PEPCID ) 20 MG tablet Take 20 mg by mouth 2 (two) times daily.    [provider]  hydrOXYzine  (ATARAX ) 25 MG tablet Take 25 mg by mouth 3 (three) times daily as needed for anxiety.    [provider]  insulin  aspart (NOVOLOG ) 100 UNIT/ML injection Inject 25 Units into the skin 3 (three) times daily before meals.    [provider]  insulin  glargine (LANTUS SOLOSTAR) 100 UNIT/ML Solostar Pen Inject 20 Units into the skin at bedtime.    [provider]  labetalol  (NORMODYNE ) 100 MG tablet Take 1 tablet (100 mg total) by mouth 2 (two) times daily. Patient taking differently: Take 100 mg by mouth 3 (three) times daily. 01/17/23   Rasch, Bridgette Campus I, NP  levothyroxine  (SYNTHROID ) 25 MCG tablet Take 25-50 mcg by mouth See admin instructions. Take 25 mcg (1 tablet) once daily before breakfast, except take 50 mcg (2 tablets) before breakfast on Monday and Friday.    [provider]  ondansetron  (ZOFRAN ) 4 MG tablet Take 4 mg by mouth every 8 (eight) hours as needed for nausea or vomiting.    [provider]  ondansetron  (ZOFRAN -ODT) 4 MG disintegrating tablet Take 1 tablet (4  mg total) by mouth every 8 (eight) hours as needed for nausea or vomiting. 03/20/23   Tari Fare, CNM  Prenatal Vit-Fe Fumarate-FA (PRENATAL VITAMIN PO) Take 1 tablet by mouth daily.    [provider]  promethazine  (PHENERGAN ) 25 MG suppository Place 1 suppository (25 mg total) rectally every 6 (six) hours as needed for nausea  or vomiting. 01/15/23   Teena Feast, MD  promethazine  (PHENERGAN ) 25 MG tablet Take 1 tablet (25 mg total) by mouth every 6 (six) hours as needed for nausea or vomiting. 01/15/23   Teena Feast, MD  scopolamine  (TRANSDERM-SCOP) 1 MG/3DAYS Place 1 patch (1.5 mg total) onto the skin every 3 (three) days. 03/20/23   Tari Fare, CNM  SYMBICORT 160-4.5 MCG/ACT inhaler Inhale 2 puffs into the lungs 2 (two) times daily as needed. 09/12/22   [provider]     Critical care time: 32 min

## 2023-11-21 NOTE — ED Notes (Signed)
 Spoke with Bonnell Butcher, RN.  Recommendation for Activated Charcoal without sorbitol now.  Clonidine , Tizanidine, xanax  and Procardia XL can cause respiratory depression, bradycardia and hypotension.  Clonidine  can cause 1st degree AV block.  Watch patient for at least 18 hours due to Procardia being XL and can last that long.  For hypotension start with fluid bolus then calcium  and norepinephrine.  For bradycardia use atropine.  Add on mag and CK.  Tylenol  recheck in 4 hours.

## 2023-11-22 DIAGNOSIS — Z794 Long term (current) use of insulin: Secondary | ICD-10-CM | POA: Diagnosis not present

## 2023-11-22 DIAGNOSIS — G928 Other toxic encephalopathy: Secondary | ICD-10-CM | POA: Diagnosis not present

## 2023-11-22 DIAGNOSIS — T50902A Poisoning by unspecified drugs, medicaments and biological substances, intentional self-harm, initial encounter: Secondary | ICD-10-CM | POA: Diagnosis not present

## 2023-11-22 DIAGNOSIS — E119 Type 2 diabetes mellitus without complications: Secondary | ICD-10-CM | POA: Diagnosis not present

## 2023-11-22 DIAGNOSIS — F332 Major depressive disorder, recurrent severe without psychotic features: Secondary | ICD-10-CM

## 2023-11-22 LAB — BASIC METABOLIC PANEL WITH GFR
Anion gap: 11 (ref 5–15)
BUN: 10 mg/dL (ref 6–20)
CO2: 21 mmol/L — ABNORMAL LOW (ref 22–32)
Calcium: 8.6 mg/dL — ABNORMAL LOW (ref 8.9–10.3)
Chloride: 106 mmol/L (ref 98–111)
Creatinine, Ser: 1.3 mg/dL — ABNORMAL HIGH (ref 0.44–1.00)
GFR, Estimated: 59 mL/min — ABNORMAL LOW (ref >60)
Glucose, Bld: 156 mg/dL — ABNORMAL HIGH (ref 70–99)
Potassium: 3.5 mmol/L (ref 3.5–5.1)
Sodium: 138 mmol/L (ref 135–145)

## 2023-11-22 LAB — PHOSPHORUS: Phosphorus: 5.3 mg/dL — ABNORMAL HIGH (ref 2.5–4.6)

## 2023-11-22 LAB — GLUCOSE, CAPILLARY
Glucose-Capillary: 172 mg/dL — ABNORMAL HIGH (ref 70–99)
Glucose-Capillary: 137 mg/dL — ABNORMAL HIGH (ref 70–99)
Glucose-Capillary: 188 mg/dL — ABNORMAL HIGH (ref 70–99)
Glucose-Capillary: 142 mg/dL — ABNORMAL HIGH (ref 70–99)
Glucose-Capillary: 78 mg/dL (ref 70–99)
Glucose-Capillary: 164 mg/dL — ABNORMAL HIGH (ref 70–99)

## 2023-11-22 LAB — BLOOD GAS, ARTERIAL
Acid-Base Excess: 0.5 mmol/L (ref 0.0–2.0)
Bicarbonate: 25.4 mmol/L (ref 20.0–28.0)
Drawn by: 23532
FIO2: 30 %
MECHVT: 400 mL
O2 Saturation: 99.3 %
PEEP: 5 cmH2O
Patient temperature: 37.3
RATE: 18 {breaths}/min
pCO2 arterial: 42 mmHg (ref 32–48)
pH, Arterial: 7.4 (ref 7.35–7.45)
pO2, Arterial: 103 mmHg (ref 83–108)

## 2023-11-22 LAB — MAGNESIUM: Magnesium: 1.9 mg/dL (ref 1.7–2.4)

## 2023-11-22 LAB — CBC
HCT: 42.2 % (ref 36.0–46.0)
Hemoglobin: 13 g/dL (ref 12.0–15.0)
MCH: 29.4 pg (ref 26.0–34.0)
MCHC: 30.8 g/dL (ref 30.0–36.0)
MCV: 95.5 fL (ref 80.0–100.0)
Platelets: 239 10*3/uL (ref 150–400)
RBC: 4.42 MIL/uL (ref 3.87–5.11)
RDW: 13.5 % (ref 11.5–15.5)
WBC: 12 10*3/uL — ABNORMAL HIGH (ref 4.0–10.5)
nRBC: 0 % (ref 0.0–0.2)

## 2023-11-22 MED ORDER — MELATONIN 5 MG PO TABS
5.0000 mg | ORAL_TABLET | Freq: Every evening | ORAL | Status: DC | PRN
  Administered 2023-11-22 – 2023-11-23 (×2): 5 mg via ORAL
  Filled 2023-11-22 (×2): qty 1

## 2023-11-22 MED ORDER — REVEFENACIN 175 MCG/3ML IN SOLN
175.0000 ug | Freq: Every day | RESPIRATORY_TRACT | Status: DC
  Administered 2023-11-23 – 2023-11-24 (×2): 175 ug via RESPIRATORY_TRACT
  Filled 2023-11-22 (×4): qty 3

## 2023-11-22 MED ORDER — CLONAZEPAM 0.5 MG PO TBDP: 0.5000 mg | ORAL_TABLET | Freq: Two times a day (BID) | ORAL | Status: DC

## 2023-11-22 MED ORDER — LACTATED RINGERS IV BOLUS
500.0000 mL | Freq: Once | INTRAVENOUS | Status: AC
  Administered 2023-11-22: 500 mL via INTRAVENOUS

## 2023-11-22 MED ORDER — ORAL CARE MOUTH RINSE: 15.0000 mL | OROMUCOSAL | Status: DC | PRN

## 2023-11-22 MED ORDER — CLONAZEPAM 0.5 MG PO TBDP: 0.5000 mg | ORAL_TABLET | Freq: Three times a day (TID) | ORAL | Status: DC | PRN

## 2023-11-22 MED ORDER — ALPRAZOLAM 0.5 MG PO TABS: 0.5000 mg | ORAL_TABLET | Freq: Three times a day (TID) | ORAL | Status: DC | PRN

## 2023-11-22 MED ORDER — LACTATED RINGERS IV BOLUS
500.0000 mL | Freq: Once | INTRAVENOUS | Status: AC
  Administered 2023-11-22: 1 mL via INTRAVENOUS

## 2023-11-22 MED ORDER — MAGNESIUM SULFATE 2 GM/50ML IV SOLN
2.0000 g | Freq: Once | INTRAVENOUS | Status: AC
  Administered 2023-11-22: 2 g via INTRAVENOUS
  Filled 2023-11-22: qty 50

## 2023-11-22 MED ORDER — ALPRAZOLAM 0.5 MG PO TABS
0.5000 mg | ORAL_TABLET | Freq: Three times a day (TID) | ORAL | Status: DC | PRN
Start: 2023-11-22 — End: 2023-11-22
  Administered 2023-11-22: 0.5 mg via ORAL
  Filled 2023-11-22: qty 1

## 2023-11-22 MED ORDER — CLONIDINE HCL 0.1 MG PO TABS: 0.1000 mg | ORAL_TABLET | Freq: Two times a day (BID) | ORAL | Status: DC

## 2023-11-22 MED ORDER — ALBUTEROL SULFATE HFA 108 (90 BASE) MCG/ACT IN AERS: 2.0000 | INHALATION_SPRAY | RESPIRATORY_TRACT | Status: DC | PRN

## 2023-11-22 MED ORDER — BUPROPION HCL ER (XL) 150 MG PO TB24
300.0000 mg | ORAL_TABLET | Freq: Every day | ORAL | Status: DC
  Administered 2023-11-23 – 2023-11-24 (×2): 300 mg via ORAL
  Filled 2023-11-22: qty 1
  Filled 2023-11-22: qty 2

## 2023-11-22 MED ORDER — KATE FARMS STANDARD 1.4 PO LIQD
325.0000 mL | Freq: Two times a day (BID) | ORAL | Status: DC
  Filled 2023-11-22: qty 325

## 2023-11-22 MED ORDER — POTASSIUM CHLORIDE 10 MEQ/100ML IV SOLN
10.0000 meq | Freq: Once | INTRAVENOUS | Status: AC
  Administered 2023-11-22: 10 meq via INTRAVENOUS

## 2023-11-22 MED ORDER — CLONAZEPAM 0.5 MG PO TBDP
0.5000 mg | ORAL_TABLET | Freq: Two times a day (BID) | ORAL | Status: DC
  Administered 2023-11-23: 0.5 mg via ORAL
  Filled 2023-11-22 (×2): qty 1

## 2023-11-22 MED ORDER — ALPRAZOLAM 0.5 MG PO TABS
0.5000 mg | ORAL_TABLET | Freq: Three times a day (TID) | ORAL | Status: DC | PRN
  Administered 2023-11-22 – 2023-11-23 (×3): 0.5 mg via ORAL
  Filled 2023-11-22 (×3): qty 1

## 2023-11-22 MED ORDER — POTASSIUM CHLORIDE 10 MEQ/100ML IV SOLN
10.0000 meq | INTRAVENOUS | Status: AC
  Administered 2023-11-22 (×3): 10 meq via INTRAVENOUS
  Filled 2023-11-22 (×4): qty 100

## 2023-11-22 MED ORDER — LIDOCAINE HCL (PF) 2% IJ FOR NEBU
2.5000 mL | Freq: Once | RESPIRATORY_TRACT | Status: DC
  Filled 2023-11-22: qty 5

## 2023-11-22 MED ORDER — IPRATROPIUM-ALBUTEROL 0.5-2.5 (3) MG/3ML IN SOLN: 3.0000 mL | Freq: Four times a day (QID) | RESPIRATORY_TRACT | Status: DC | PRN

## 2023-11-22 NOTE — Progress Notes (Addendum)
 Patient with asymptomatic hypotension throughout the afternoon.  Responded well to fluids.  She endorses thirstiness.  Endorses poor p.o. intake in the days preceding hospitalization.  Monitor LR ordered.  Encouraged increased p.o. intake.  We discussed psychiatry recommendations of inpatient psychiatric admission.  TOC consult placed.  She agrees to voluntarily stay on await placement.  She is anxious, misses her child at home.  I informed her that she desired or attempted to leave the involuntarily committed.  She again reemphasized that she is going to stay.  Remain in stepdown for tonight, if blood pressure improves expect to be medically cleared tomorrow.

## 2023-11-22 NOTE — Plan of Care (Signed)
  Problem: Coping: Goal: Ability to adjust to condition or change in health will improve Outcome: Progressing   Problem: Metabolic: Goal: Ability to maintain appropriate glucose levels will improve Outcome: Progressing   Problem: Skin Integrity: Goal: Risk for impaired skin integrity will decrease Outcome: Progressing   Problem: Safety: Goal: Non-violent Restraint(s) Outcome: Progressing   Problem: Education: Goal: Knowledge of General Education information will improve Description: Including pain rating scale, medication(s)/side effects and non-pharmacologic comfort measures Outcome: Progressing   Problem: Clinical Measurements: Goal: Will remain free from infection Outcome: Progressing Goal: Diagnostic test results will improve Outcome: Progressing Goal: Respiratory complications will improve Outcome: Progressing Goal: Cardiovascular complication will be avoided Outcome: Progressing   Problem: Fluid Volume: Goal: Ability to maintain a balanced intake and output will improve Outcome: Not Progressing   Problem: Nutritional: Goal: Maintenance of adequate nutrition will improve Outcome: Not Progressing   Problem: Coping: Goal: Level of anxiety will decrease Outcome: Not Progressing

## 2023-11-22 NOTE — Procedures (Signed)
 Extubation Procedure Note  Patient Details:   Name: Carol Paul DOB: 02-09-1996 MRN: [id number]   Airway Documentation:    Vent end date: 11/22/23 Vent end time: 1015   Evaluation  O2 sats: stable throughout Complications: No apparent complications Patient did tolerate procedure well. Bilateral Breath Sounds: Clear, Diminished   Yes  Rosia Cook Nannette 11/22/2023, 10:19 AM  Negative cuff leak pre extubation- MD aware- ok to extubate. Placed on 2 LPM post extubation. RN aware.

## 2023-11-22 NOTE — Progress Notes (Signed)
 [facility] ADULT ICU REPLACEMENT PROTOCOL   The patient does apply for the [facility] Adult ICU Electrolyte Replacment Protocol based on the criteria listed below:   1.Exclusion criteria: TCTS, ECMO, Dialysis, and Myasthenia Gravis patients 2. Is GFR >/= 30 ml/min? Yes.    Patient's GFR today is 59 3. Is SCr </= 2? Yes.   Patient's SCr is 1.30 mg/dL 4. Did SCr increase >/= 0.5 in 24 hours? No. 5.Pt's weight >40kg  Yes.   6. Abnormal electrolyte(s): mag 1.9, potassium 3.5  7. Electrolytes replaced per protocol 8.  Call MD STAT for K+ </= 2.5, Phos </= 1, or Mag </= 1 Physician:  protocol  Marva Sleight 11/22/2023 5:35 AM

## 2023-11-22 NOTE — Progress Notes (Signed)
 Pt has requested to be transferred to [facility] in [facility] for psychiatric treatment following her stay at [facility]. Their phone number is [phone] (transfer to [facility]). Pt states that they require a social work referral and they will arrange transfer.

## 2023-11-22 NOTE — Progress Notes (Signed)
 Tanya Fantasia of Poison controlled called and said hypotension is side effect of the medications that she ingested. She advised to continue monitoring the patient and if she becomes symptomatic, pressors will be beneficial for the patient.

## 2023-11-22 NOTE — Consult Note (Signed)
 [facility] Psychiatric Consult Initial  Patient Name: .Carol Paul  MRN: [id number]  DOB: Jan 29, 1996  Consult Order details:  Orders (From admission, onward)     Start     Ordered   11/22/23 1125  IP CONSULT TO PSYCHIATRY       Ordering Provider: Guerry Leek, MD  Provider:  (Not yet assigned)  Question Answer Comment  Location [facility]   Reason for Consult? suicide attempt      11/22/23 1124             Mode of Visit: In person    Psychiatry Consult Evaluation  Service Date: Nov 22, 2023 LOS:  LOS: 1 day  Chief Complaint "I wanted to kill myself because I'm stressed out and overwhelmed taking care of my baby alone as I don't have anyone to support me"  Primary Psychiatric Diagnoses  Major depressive disorder PTSD 2.  Panic disorder   Assessment  Carol Paul is a 28 y.o. female admitted: Medicallyfor 11/21/2023  6:34 AM for suicide attempts. She carries the psychiatric diagnoses of MDD, panic disorder, PTSD,Borderline personality disorder and Cannabis use abuse has a past medical history of  hypertension and T2DM. Psychiatry consulted for suicide attempt.   Her current presentation of recent suicide attempts, stress, depressive symptoms, feeling overwhelmed and recent separation from wife is most consistent with Severe major depressive disorder. Patient meets criteria for inpatient admission based on recent suicide attempt, active depressive symptoms, lack of social supports, inability to contract for safety in the context of ongoing psychosocial stressors.  Current outpatient psychotropic medications include Wellbutrin XL 300 mg daily, Xanax  0.5 mg three times daily and Clonidine  0.1 mg 2-3 times daily and historically she has had a moderate response to these medications. She was  compliant with medications prior to admission as evidenced by report from patient. On initial examination, patient alert and oriented x 3. Mood is "worried"  with labile affect. Please see plan below for detailed recommendations.   Diagnoses:  Active Hospital problems: Principal Problem:   Intentional overdose (HCC)    Plan   ## Psychiatric Medication Recommendations:  -Start home medications Wellbutrin XL 300 mg every morning on 11/23/23 -Clonidine  0.1 mg 2 times daily.  -Switch Xanax  to Clonazepam 0.5 mg twice daily for panic disorder with less risk of withdrawal. -Monitor for withdrawal symptoms.   -Please transfer to inpatient psychiatric unit once patient is medically cleared. -Consider TOC/Social worker consult to facilitate inpatient transfer.     ## Medical Decision Making Capacity: Not specifically addressed in this encounter  ## Further Work-up:  --  TSH, B12, folate -- most recent EKG on 11/22/2023 had QtC of 451 -- Pertinent labwork reviewed earlier this admission includes:   ## Disposition:-- We recommend inpatient psychiatric hospitalization when medically cleared. Patient is under voluntary admission status at this time; please IVC if attempts to leave hospital.  ## Behavioral / Environmental: -Delirium Precautions: Delirium Interventions for Nursing and Staff: - RN to open blinds every AM. - To Bedside: Glasses, hearing aide, and pt's own shoes. Make available to patients. when possible and encourage use. - Encourage po fluids when appropriate, keep fluids within reach. - OOB to chair with meals. - Passive ROM exercises to all extremities with AM & PM care. - RN to assess orientation to person, time and place QAM and PRN. - Recommend extended visitation hours with familiar family/friends as feasible. - Staff to minimize disturbances at night. Turn off television when  pt asleep or when not in use.    ## Safety and Observation Level:  - Based on my clinical evaluation, I estimate the patient to be at high risk of self harm in the current setting. - At this time, we recommend  1:1 Observation. This decision is based on my  review of the chart including patient's history and current presentation, interview of the patient, mental status examination, and consideration of suicide risk including evaluating suicidal ideation, plan, intent, suicidal or self-harm behaviors, risk factors, and protective factors. This judgment is based on our ability to directly address suicide risk, implement suicide prevention strategies, and develop a safety plan while the patient is in the clinical setting. Please contact our team if there is a concern that risk level has changed.  CSSR Risk Category:C-SSRS RISK CATEGORY: High Risk  Suicide Risk Assessment: Patient has following modifiable risk factors for suicide: active suicidal ideation, under treated depression , and social isolation, which we are addressing by prescribing medications. Patient has following non-modifiable or demographic risk factors for suicide: separation or divorce Patient has the following protective factors against suicide: Access to outpatient mental health care and no history of suicide attempts  Thank you for this consult request. Recommendations have been communicated to the primary team.  We will follow up at this time.   Carol Hatchet, MD       History of Present Illness  Relevant Aspects of [facility] Course:   Patient Report:  Patient seen face to face in her hospital room with her friend at bedside. Patient is awake, alert and oriented to time, place, person and situation. Patient reports she tried to kill herself by overdosing because her "wife" left her 4 weeks ago due to frequent argument and misunderstanding. She reports that her wife calls her name, yells at her over every little thing and eventually told her she is no more interested. Patient explains further that she has an 81 week old baby and she's been very stressed and overwhelmed taking care of her alone because she has no other support system as her parents abandoned her. As a result,  patient states she decided to end her life by taking 3 pills of Xanax  0.5 mg and multiple pills of Tizanidine and Nifedipine. Patient reports life has been very stressful for her due to her chronic history of trauma since childhood, and loss of Triplet in 2024. Patient reports ongoing depressed mood, lack of motivation, low energy level, worry and apprehension about her baby who is being taken care of by the mother of her wife.She reports history of para suicidal behavior by cutting but denies history of previous suicide attempts.   Patient reports psychiatric history of major depression, panic disorder, PTSD, and recent diagnosis of borderline personality disorder. She follows up with a psychiatrist and recently started therapy. Her current outpatient medications in Wellbutrin XL 300 mg daily, Xanax  0.5 mg three times daily and Clonidine  0.1 mg 2-3 times daily.    Review of Systems  Psychiatric/Behavioral:  Positive for depression, substance abuse and suicidal ideas. Negative for hallucinations. The patient is nervous/anxious.      Psychiatric and Social History  Psychiatric History:  Information collected from patient  Prev Dx/Sx: PTSD,MDD, Panic disorder, and Borderline personality disorder.  Current Psych Provider: patient follows up with psychiatrist and therapist Home Meds (current): Wellbutrin XL 300 mg daily, Xanax  0.5 mg three times daily and Clonidine  0.1 mg 2-3 times daily.  Previous Med Trials: unknown  Therapy:  yes  Prior Psych Hospitalization: denies   Prior Self Harm: yes, by cutting  Prior Violence: denies   Family Psych History: denies  Family Hx suicide: denies   Social History:  Educational Hx: high school graduate Occupational Hx: unemployed  Armed forces operational officer Hx: denies  Living Situation: lives alone  Spiritual Hx: unknown  Access to weapons/lethal means: patient denies    Substance History Alcohol: social drinker   Type of alcohol beer Last Drink weeks ago Number of  drinks per day socially History of alcohol withdrawal seizures denies  History of DT's denies  Tobacco: denies  Illicit drugs: Cannabis 1 blunt weekly Prescription drug abuse: denies  Rehab hx: denies   Exam Findings   Vital Signs:  Temp:  [97.3 F (36.3 C)-101.1 F (38.4 C)] 100.4 F (38 C) (05/17 1518) Pulse Rate:  [43-151] 96 (05/17 1600) Resp:  [12-25] 18 (05/17 1600) BP: (82-197)/(50-94) 82/50 (05/17 1600) SpO2:  [92 %-100 %] 100 % (05/17 1600) FiO2 (%):  [30 %] 30 % (05/17 0853) Weight:  [90.8 kg] 90.8 kg (05/17 0440) Blood pressure (!) 82/50, pulse 96, temperature (!) 100.4 F (38 C), resp. rate 18, height 5\' 2"  (1.575 m), weight 90.8 kg, last menstrual period 12/16/2022, SpO2 100%, unknown if currently breastfeeding. Body mass index is 36.61 kg/m.  Physical Exam  Mental Status Exam: General Appearance: Casual  Orientation:  Full (Time, Place, and Person)  Memory:  Immediate;   Good  Concentration:  Concentration: Good and Attention Span: Good  Recall:  Good  Attention  Good  Eye Contact:  Good  Speech:  Normal Rate  Language:  Good  Volume:  Normal  Mood: "worried"  Affect:  Depressed  Thought Process:  Linear  Thought Content:  Logical  Suicidal Thoughts:  Yes.  with intent/plan  Homicidal Thoughts:  No  Judgement:  Poor  Insight:  Lacking  Psychomotor Activity:  Normal  Akathisia:  No  Fund of Knowledge:  Good      Assets:  Communication Skills  Cognition:  WNL  ADL's:  Intact  AIMS (if indicated):        Other History   These have been pulled in through the EMR, reviewed, and updated if appropriate.  Family History:  The patient's family history includes CAD in an other family member; Healthy in her mother; Hypothyroidism in her father.  Medical History: Past Medical History:  Diagnosis Date   Anxiety    Asthma    Borderline personality disorder in adult [facility] 2025   diagnosed several days PTA   Diabetes mellitus without complication  (HCC)    Hypertension    Hyperthyroidism    Ovarian cyst    UTI (urinary tract infection)     Surgical History: Past Surgical History:  Procedure Laterality Date   ANKLE SURGERY Left    CESAREAN SECTION  2025   Febuary 25th   TONSILLECTOMY       Medications:   Current Facility-Administered Medications:    acetaminophen  (TYLENOL ) suppository 650 mg, 650 mg, Rectal, Q4H PRN, Ogan, Okoronkwo U, MD, 650 mg at 11/21/23 2132   acetaminophen  (TYLENOL ) tablet 650 mg, 650 mg, Oral, Q4H PRN, Hunsucker, Archer Kobs, MD, 650 mg at 11/22/23 1537   ALPRAZolam  (XANAX ) tablet 0.5 mg, 0.5 mg, Oral, TID PRN, Hunsucker, Archer Kobs, MD, 0.5 mg at 11/22/23 1157   arformoterol  (BROVANA ) nebulizer solution 15 mcg, 15 mcg, Nebulization, BID, Hunsucker, Archer Kobs, MD, 15 mcg at 11/22/23 0851   budesonide  (PULMICORT ) nebulizer solution 0.5  mg, 0.5 mg, Nebulization, BID, Hunsucker, Archer Kobs, MD, 0.5 mg at 11/22/23 2956   Chlorhexidine  Gluconate Cloth 2 % PADS 6 each, 6 each, Topical, Daily, Hunsucker, Archer Kobs, MD, 6 each at 11/21/23 2130   docusate sodium  (COLACE) capsule 100 mg, 100 mg, Oral, BID PRN, Hunsucker, Archer Kobs, MD   enoxaparin  (LOVENOX ) injection 40 mg, 40 mg, Subcutaneous, Q24H, Hunsucker, Archer Kobs, MD, 40 mg at 11/22/23 8657   famotidine  (PEPCID ) IVPB 20 mg premix, 20 mg, Intravenous, Q12H, Ogan, Okoronkwo U, MD, Stopped at 11/22/23 0943   [START ON 11/23/2023] feeding supplement (KATE FARMS STANDARD 1.4) liquid 325 mL, 325 mL, Oral, BID BM, Hunsucker, Archer Kobs, MD   insulin  aspart (novoLOG ) injection 0-15 Units, 0-15 Units, Subcutaneous, Q4H, Hunsucker, Archer Kobs, MD, 3 Units at 11/22/23 1212   insulin  glargine-yfgn (SEMGLEE ) injection 20 Units, 20 Units, Subcutaneous, Daily, Hunsucker, Archer Kobs, MD, 20 Units at 11/22/23 0915   ipratropium-albuterol  (DUONEB) 0.5-2.5 (3) MG/3ML nebulizer solution 3 mL, 3 mL, Nebulization, Q6H PRN, Hunsucker, Archer Kobs, MD, 3 mL at 11/22/23 8469   lactated  ringers  1,000 mL with potassium chloride  20 mEq infusion, , Intravenous, Continuous, Hadley Leu, NP, Last Rate: 75 mL/hr at 11/22/23 1518, Infusion Verify at 11/22/23 1518   lidocaine  2% "Nebulized" 2.5 mL, 2.5 mL, Nebulization, Once, Hunsucker, Archer Kobs, MD   ondansetron  (ZOFRAN ) injection 4 mg, 4 mg, Intravenous, Q6H PRN, Hunsucker, Archer Kobs, MD   Oral care mouth rinse, 15 mL, Mouth Rinse, Q2H, Hunsucker, Archer Kobs, MD, 15 mL at 11/22/23 1621   Oral care mouth rinse, 15 mL, Mouth Rinse, PRN, Hunsucker, Archer Kobs, MD   Oral care mouth rinse, 15 mL, Mouth Rinse, PRN, Hunsucker, Archer Kobs, MD   polyethylene glycol (MIRALAX  / GLYCOLAX ) packet 17 g, 17 g, Oral, Daily PRN, Hunsucker, Archer Kobs, MD  Allergies: Allergies  Allergen Reactions   Peanut Oil Anaphylaxis   Sesame Oil Anaphylaxis   Sesame Seed (Diagnostic) Anaphylaxis   Shellfish Allergy Anaphylaxis   Soy Allergy (Obsolete) Anaphylaxis   Soybean Oil Anaphylaxis   Silicone Dermatitis   Egg-Derived Products Diarrhea, Nausea Only and Other (See Comments)    GI discomfort   Milk (Cow) Diarrhea, Nausea Only and Other (See Comments)    GI discomfort   Reglan  [Metoclopramide ] Swelling    Face & Extremeties   Wheat Diarrhea, Nausea Only and Other (See Comments)    GI discomfort   Prochlorperazine  Anxiety    Pt wants noted in her chart that she does not want to take this medication again    Sava Proby A Marjan Rosman, MD

## 2023-11-22 NOTE — Progress Notes (Addendum)
 eLink Physician-Brief Progress Note Patient Name: Carol Paul DOB: [birth year] MRN: [id number]   Date of Service  11/22/2023  HPI/Events of Note  Has insomnia, requesting melatonin  Has had asymptomatic hypotension throughout the day and responded appropriately to fluids.  eICU Interventions  Melatonin ordered  Repeat LR bolus   0001 -still low blood pressure 76/45.  Will attempt orthostatics, if asymptomatic and negative orthostatics, no further intervention.  If sustained systolics in the 60s, we will initiate low-dose norepinephrine.  Intervention Category Minor Interventions: Routine modifications to care plan (e.g. PRN medications for pain, fever)  Allyce Bochicchio 11/22/2023, 10:16 PM

## 2023-11-22 NOTE — TOC Initial Note (Signed)
 Transition of Care [facility]) - Initial/Assessment Note   Patient Details  Name: Carol Paul MRN: [id number] Date of Birth: 01-17-1996  [facility]) CM/SW Contact:    Zenon Hilda, LCSW Phone Number: 11/22/2023, 1:58 PM  Clinical Narrative: TOC initially consulted for IVC, but pt was intubated. CSW followed up with physician and the IVC is on hold for now as pt currently seems agreeable/voluntary to treatment but will need to be IVC'd if she attempts to leave. Psych consulted. TOC to follow.  Expected Discharge Plan:  (TBD) Barriers to Discharge: Continued Medical Work up  Expected Discharge Plan and Services In-house Referral: Clinical Social Work Living arrangements for the past 2 months: Apartment           DME Arranged: N/A DME Agency: NA  Prior Living Arrangements/Services Living arrangements for the past 2 months: Apartment Patient language and need for interpreter reviewed:: Yes Need for Family Participation in Patient Care: Yes (Comment) Care giver support system in place?: Yes (comment) Criminal Activity/Legal Involvement Pertinent to Current Situation/Hospitalization: No - Comment as needed  Activities of Daily Living ADL Screening (condition at time of admission) Independently performs ADLs?: Yes (appropriate for developmental age) Is the patient deaf or have difficulty hearing?: No Does the patient have difficulty seeing, even when wearing glasses/contacts?: No Does the patient have difficulty concentrating, remembering, or making decisions?: Yes  Emotional Assessment Orientation: : Oriented to Place, Oriented to Self, Oriented to  Time, Oriented to Situation Alcohol / Substance Use: Other (comment) Psych Involvement: No (comment)  Admission diagnosis:  Drug overdose [T50.901A] Intentional overdose, initial encounter (HCC) [T50.902A] Patient Active Problem List   Diagnosis Date Noted   Intentional overdose (HCC) 11/21/2023   Xanax  use disorder,  moderate (HCC) 01/18/2023   Prediabetes 01/15/2023   Acute asthma exacerbation 12/02/2022   History of postpartum hemorrhage 09/12/2022   Vaginal bleeding during pregnancy 08/30/2022   Anemia associated with acute blood loss 08/29/2022   Short cervix during pregnancy in second trimester 08/29/2022   Vaginal bleeding in pregnancy, second trimester 08/24/2022   Anxiety 08/21/2022   [redacted] weeks gestation of pregnancy 08/21/2022   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 [place] [address] Phone: [phone] Fax: [phone]  [facility] [address] [place] [address] Phone: [phone] Fax: [phone]  Social Drivers of Health (SDOH) Social History: SDOH Screenings   Food Insecurity: Patient Unable To Answer (11/21/2023)  Housing: Patient Unable To Answer (11/21/2023)  Recent Concern: Housing - High Risk (09/01/2023)   Received from [facility]  Transportation Needs: Patient Unable To Answer (11/21/2023)  Utilities: Not At Risk (10/16/2023)   Received from [facility]  Financial Resource Strain: Low Risk  (10/16/2023)   Received from [facility]  Physical Activity: Insufficiently Active (04/09/2023)   Received from [facility]  Social Connections: Moderately Integrated (04/09/2023)   Received from [facility]  Recent Concern: Social Connections - Somewhat Isolated (01/23/2023)   Received from [facility]  Stress: Stress Concern Present (04/09/2023)   Received from [facility]  Tobacco Use: Medium Risk (11/21/2023)   Health Literacy: Adequate Health Literacy (04/09/2023)   Received  from [facility]   SDOH Interventions:    Readmission Risk Interventions    11/22/2023    1:55 PM 12/02/2022   10:25 AM  Readmission Risk Prevention Plan  Transportation Screening Complete Complete  PCP or Specialist Appt within 3-5 Days  Complete  HRI or Home Care Consult Complete Complete  Social Work Consult for Recovery Care Planning/Counseling Complete Complete  Palliative Care Screening Not Applicable Not Applicable  Medication Review Oceanographer) Complete Referral to Pharmacy

## 2023-11-22 NOTE — Progress Notes (Signed)
 Patient passed her YALE assessment. I gave her some water  and she swallowed fine without interruption. MD awared

## 2023-11-22 NOTE — Progress Notes (Signed)
 NAME:  Carol Paul, MRN:  [id number], DOB:  Jul 20, 1995, LOS: 1 ADMISSION DATE:  11/21/2023, CONSULTATION DATE:  5/16 REFERRING MD:  Inga Manges , CHIEF COMPLAINT:  overdose    History of Present Illness:  28 year old female patient with a history as outlined below specifically with a history of anxiety, depression and bipolar disorder.  On 5/16 patient awoke her roommate telling her she had taken all of her medications and attempted to end her life.  Medications included Xanax , recently filled and completely empty, tizanidine and nifedipine.  She became progressively less responsive and EMS was called.  On arrival to the emergency room she was initially mumbling to voice, then became even more progressively unresponsive.  She did not respond to Narcan.  She was intubated in the emergency room for airway protection. 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    Interim History / Subjective:  Needed more sedation, waking up, passed SBT, sedation turned off, awaiting meds to wear off then extubate, she agrees to stay for psych evaluation.   Objective    Blood pressure 121/86, pulse 82, temperature 97.7 F (36.5 C), resp. rate 17, height 5\' 2"  (1.575 m), weight 90.8 kg, last menstrual period 12/16/2022, SpO2 99%, unknown if currently breastfeeding.    Vent  Mode: PRVC FiO2 (%):  [30 %-40 %] 30 % Set Rate:  [18 bmp] 18 bmp Vt Set:  [400 mL] 400 mL PEEP:  [5 cmH20] 5 cmH20 Plateau Pressure:  [12 cmH20-14 cmH20] 13 cmH20   Intake/Output Summary (Last 24 hours) at 11/22/2023 0900 Last data filed at 11/22/2023 0856 Gross per 24 hour  Intake 2612.87 ml  Output 3600 ml  Net -987.13 ml   Filed Weights   11/21/23 0928 11/22/23 0440  Weight: 91.2 kg 90.8 kg    Examination: General: Obese 28 year old female patient currently sedated on full ventilator support HENT: Pupils equal reactive sclera nonicteric orally intubated Lungs: Clear diminished bases portable chest x-ray personally reviewed is without infiltrates Cardiovascular: regular rate and rhythm without murmur rub or gallop Abdomen: Soft not tender, has an NG in place.   Extremities: Warm dry brisk cap refill Neuro: Opens eyes, writes and communicates, goes to sleep easily  Resolved problem list   Assessment and Plan  Intentional overdose, suicide attempt: -- Will need sitter once extubated, psychiatric evaluation once medically cleared --patient agrees to stay for psych eval -- Appreciate poison control assistance, activated charcoal administered 5/16   Toxic encephalopathy: Largely driven by Xanax  overdose. 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  H.o HTN: Hold meds for now  Best Practice (right click and "Reselect all SmartList Selections" daily)   Diet/type: NPO DVT prophylaxis LMWH Pressure  ulcer(s):  see nursing documentation  GI prophylaxis: H2B Lines: N/A Foley:  Yes, and it is still needed Code Status:  full code Last date of multidisciplinary goals of care discussion [pending]  Labs   CBC: Recent Labs  Lab 11/21/23 0636 11/21/23 0650 11/22/23 0312  WBC 17.5*  --  12.0*  NEUTROABS 13.2*  --   --   HGB 14.3 15.6* 13.0  HCT 43.0 46.0 42.2  MCV 90.0  --  95.5  PLT 335  --  239    Basic Metabolic Panel: Recent Labs  Lab 11/21/23 0636 11/21/23 0650 11/22/23 0312  NA 130* 134* 138  K 3.2* 3.5 3.5  CL 95* 98 106  CO2 23  --  21*  GLUCOSE 285* 296* 156*  BUN 9 8 10   CREATININE 0.98 1.00 1.30*  CALCIUM  9.3  --  8.6*  MG 2.3  --  1.9  PHOS  --   --  5.3*   GFR: Estimated Creatinine Clearance: 69.3 mL/min (A) (by C-G formula based on SCr of 1.3 mg/dL (H)). Recent Labs  Lab 11/21/23 0636 11/21/23 0645 11/21/23 1024 11/22/23 0312  WBC 17.5*  --   --  12.0*  LATICACIDVEN  --  1.0 2.9*  --     Liver Function Tests: Recent Labs  Lab 11/21/23 0636  AST 40  ALT 70*  ALKPHOS 88  BILITOT 1.1  PROT 8.0  ALBUMIN 4.4   No results for input(s): "LIPASE", "AMYLASE" in the last 168 hours. No results for input(s): "AMMONIA" in the last 168 hours.  ABG    Component Value Date/Time   PHART 7.4 11/22/2023 0327   PCO2ART 42 11/22/2023 0327   PO2ART 103 11/22/2023 0327   HCO3 25.4 11/22/2023 0327   TCO2 25 11/21/2023 0650   ACIDBASEDEF 3.0 (H) 11/21/2023 0745   O2SAT 99.3 11/22/2023 0327     Coagulation Profile: No results for input(s): "INR", "PROTIME" in the last 168 hours.  Cardiac Enzymes: Recent Labs  Lab 11/21/23 0636  CKTOTAL 122    HbA1C: Hgb A1c MFr Bld  Date/Time Value Ref Range Status  11/21/2023 06:36 AM 4.9 4.8 - 5.6 % Final    Comment:    (NOTE) Pre diabetes:          5.7%-6.4%  Diabetes:              >6.4%  Glycemic control for   <7.0% adults with diabetes   01/15/2023 09:20 AM 6.1 (H) 4.8 - 5.6 % Final    Comment:     (NOTE) Pre diabetes:          5.7%-6.4%  Diabetes:              >6.4%  Glycemic control for   <7.0% adults with diabetes     CBG: Recent Labs  Lab 11/21/23 1529 11/21/23 1946 11/21/23 2337 11/22/23 0349 11/22/23 0754  GLUCAP 118* 120* 128* 172* 137*    Review of Systems:   Not able   Past Medical History:  She,  has a past medical history of Anxiety, Asthma, Borderline personality disorder in adult (HCC) (2025), Diabetes mellitus without complication (HCC), Hypertension, Hyperthyroidism, Ovarian cyst, and UTI (urinary tract infection).   Surgical History:   Past Surgical History:  Procedure Laterality Date   ANKLE SURGERY Left  CESAREAN SECTION  2025   Febuary 25th   TONSILLECTOMY       Social History:   reports that she has quit smoking. She has been exposed to tobacco smoke. She has never used smokeless tobacco. She reports current alcohol use of about 1.0 standard drink of alcohol per week. She reports that she does not currently use drugs after having used the following drugs: Marijuana.   Family History:  Her family history includes CAD in an other family member; Healthy in her mother; Hypothyroidism in her father.   Allergies Allergies  Allergen Reactions   Peanut Oil Anaphylaxis   Sesame Oil Anaphylaxis   Sesame Seed (Diagnostic) Anaphylaxis   Shellfish Allergy Anaphylaxis   Soy Allergy (Obsolete) Anaphylaxis   Soybean Oil Anaphylaxis   Silicone Dermatitis   Egg-Derived Products Diarrhea, Nausea Only and Other (See Comments)    GI discomfort   Milk (Cow) Diarrhea, Nausea Only and Other (See Comments)    GI discomfort   Reglan  [Metoclopramide ] Swelling    Face & Extremeties   Wheat Diarrhea, Nausea Only and Other (See Comments)    GI discomfort   Prochlorperazine  Anxiety    Pt wants noted in her chart that she does not want to take this medication again     Home Medications  Prior to Admission medications   Medication Sig Start Date End  Date Taking? Authorizing Provider  albuterol  (PROVENTIL ) (2.5 MG/3ML) 0.083% nebulizer solution Take 3 mLs (2.5 mg total) by nebulization every 6 (six) hours as needed for wheezing or shortness of breath. 01/15/23   Teena Feast, MD  albuterol  (VENTOLIN  HFA) 108 [phone] Base) MCG/ACT inhaler Inhale 1-2 puffs into the lungs every 4 (four) hours as needed for wheezing or shortness of breath. 01/15/23   Teena Feast, MD  alprazolam  (XANAX ) 2 MG tablet Take 1 mg by mouth every 8 (eight) hours as needed for anxiety.    [provider]  cloNIDine  (CATAPRES ) 0.1 MG tablet Take 0.1 mg by mouth at bedtime.    [provider]  diphenhydrAMINE  (BENADRYL ) 25 MG tablet Take 50 mg by mouth daily as needed (following anaphylactic reaction).    [provider]  EPINEPHrine  0.3 mg/0.3 mL IJ SOAJ injection Inject 0.3 mg into the muscle as needed for anaphylaxis. 04/28/23   Davis, Jonathon H, MD  famotidine  (PEPCID ) 20 MG tablet Take 20 mg by mouth 2 (two) times daily.    [provider]  hydrOXYzine  (ATARAX ) 25 MG tablet Take 25 mg by mouth 3 (three) times daily as needed for anxiety.    [provider]  insulin  aspart (NOVOLOG ) 100 UNIT/ML injection Inject 25 Units into the skin 3 (three) times daily before meals.    [provider]  insulin  glargine (LANTUS  SOLOSTAR) 100 UNIT/ML Solostar Pen Inject 20 Units into the skin at bedtime.    [provider]  labetalol  (NORMODYNE ) 100 MG tablet Take 1 tablet (100 mg total) by mouth 2 (two) times daily. Patient taking differently: Take 100 mg by mouth 3 (three) times daily. 01/17/23   Rasch, Bridgette Campus I, NP  levothyroxine  (SYNTHROID ) 25 MCG tablet Take 25-50 mcg by mouth See admin instructions. Take 25 mcg (1 tablet) once daily before breakfast, except take 50 mcg (2 tablets) before breakfast on Monday and Friday.    [provider]  ondansetron  (ZOFRAN ) 4 MG tablet Take 4 mg by mouth every 8 (eight)  hours as needed for nausea or vomiting.    [provider]  ondansetron  (ZOFRAN -ODT) 4 MG disintegrating tablet Take 1 tablet (4 mg total) by mouth every 8 (eight) hours as needed for nausea or vomiting. 03/20/23   Tari Fare, CNM  Prenatal Vit-Fe Fumarate-FA (PRENATAL VITAMIN PO) Take 1 tablet by mouth daily.    [provider]  promethazine  (PHENERGAN ) 25 MG suppository Place 1 suppository (25 mg total) rectally every 6 (six) hours as needed for nausea or vomiting. 01/15/23   Teena Feast, MD  promethazine  (PHENERGAN ) 25 MG tablet Take 1 tablet (25 mg total) by mouth every 6 (six) hours as needed for nausea or vomiting. 01/15/23   Teena Feast, MD  scopolamine  (TRANSDERM-SCOP) 1 MG/3DAYS Place 1 patch (1.5 mg total) onto the skin every 3 (three) days. 03/20/23   Tari Fare, CNM  SYMBICORT 160-4.5 MCG/ACT inhaler Inhale 2 puffs into the lungs 2 (two) times daily as needed. 09/12/22   [provider]     Critical care time:     CRITICAL CARE Performed by: Guerry Leek   Total critical care time: 32 minutes  Critical care time was exclusive of separately billable procedures and treating other patients.  Critical care was necessary to treat or prevent imminent or life-threatening deterioration.  Critical care was time spent personally by me on the following activities: development of treatment plan with patient and/or surrogate as well as nursing, discussions with consultants, evaluation of patient's response to treatment, examination of patient, obtaining history from patient or surrogate, ordering and performing treatments and interventions, ordering and review of laboratory studies, ordering and review of radiographic studies, pulse oximetry and re-evaluation of patient's condition.  Guerry Leek, MD See [place] Foley

## 2023-11-23 ENCOUNTER — Inpatient Hospital Stay (HOSPITAL_COMMUNITY)

## 2023-11-23 DIAGNOSIS — T50902A Poisoning by unspecified drugs, medicaments and biological substances, intentional self-harm, initial encounter: Secondary | ICD-10-CM | POA: Diagnosis not present

## 2023-11-23 DIAGNOSIS — F332 Major depressive disorder, recurrent severe without psychotic features: Secondary | ICD-10-CM | POA: Diagnosis not present

## 2023-11-23 LAB — COMPREHENSIVE METABOLIC PANEL WITH GFR
ALT: 33 U/L (ref 0–44)
AST: 16 U/L (ref 15–41)
Albumin: 3.2 g/dL — ABNORMAL LOW (ref 3.5–5.0)
Alkaline Phosphatase: 80 U/L (ref 38–126)
Anion gap: 12 (ref 5–15)
BUN: 9 mg/dL (ref 6–20)
CO2: 23 mmol/L (ref 22–32)
Calcium: 8.9 mg/dL (ref 8.9–10.3)
Chloride: 103 mmol/L (ref 98–111)
Creatinine, Ser: 1.04 mg/dL — ABNORMAL HIGH (ref 0.44–1.00)
GFR, Estimated: >60 mL/min (ref >60)
Glucose, Bld: 99 mg/dL (ref 70–99)
Potassium: 3.2 mmol/L — ABNORMAL LOW (ref 3.5–5.1)
Sodium: 138 mmol/L (ref 135–145)
Total Bilirubin: 0.8 mg/dL (ref 0.0–1.2)
Total Protein: 6.3 g/dL — ABNORMAL LOW (ref 6.5–8.1)

## 2023-11-23 LAB — GLUCOSE, CAPILLARY
Glucose-Capillary: 104 mg/dL — ABNORMAL HIGH (ref 70–99)
Glucose-Capillary: 127 mg/dL — ABNORMAL HIGH (ref 70–99)
Glucose-Capillary: 136 mg/dL — ABNORMAL HIGH (ref 70–99)
Glucose-Capillary: 122 mg/dL — ABNORMAL HIGH (ref 70–99)
Glucose-Capillary: 133 mg/dL — ABNORMAL HIGH (ref 70–99)

## 2023-11-23 LAB — CBC
HCT: 41.8 % (ref 36.0–46.0)
Hemoglobin: 13.1 g/dL (ref 12.0–15.0)
MCH: 29.6 pg (ref 26.0–34.0)
MCHC: 31.3 g/dL (ref 30.0–36.0)
MCV: 94.4 fL (ref 80.0–100.0)
Platelets: 217 10*3/uL (ref 150–400)
RBC: 4.43 MIL/uL (ref 3.87–5.11)
RDW: 13.5 % (ref 11.5–15.5)
WBC: 7.8 10*3/uL (ref 4.0–10.5)
nRBC: 0 % (ref 0.0–0.2)

## 2023-11-23 MED ORDER — LEVOTHYROXINE SODIUM 50 MCG PO TABS
50.0000 ug | ORAL_TABLET | Freq: Every day | ORAL | Status: DC
  Administered 2023-11-23 – 2023-11-24 (×2): 50 ug via ORAL
  Filled 2023-11-23: qty 1
  Filled 2023-11-23: qty 2

## 2023-11-23 MED ORDER — FAMOTIDINE 20 MG PO TABS: 20.0000 mg | ORAL_TABLET | Freq: Two times a day (BID) | ORAL | Status: DC | PRN

## 2023-11-23 MED ORDER — MAGNESIUM OXIDE -MG SUPPLEMENT 400 (240 MG) MG PO TABS: 200.0000 mg | ORAL_TABLET | Freq: Every evening | ORAL | Status: DC | PRN

## 2023-11-23 MED ORDER — SODIUM CHLORIDE 0.9 % IV SOLN
250.0000 mL | INTRAVENOUS | Status: AC
  Administered 2023-11-23: 250 mL via INTRAVENOUS

## 2023-11-23 MED ORDER — NOREPINEPHRINE 4 MG/250ML-% IV SOLN: 2.0000 ug/min | INTRAVENOUS | Status: DC

## 2023-11-23 MED ORDER — ALPRAZOLAM 0.5 MG PO TABS
0.5000 mg | ORAL_TABLET | Freq: Three times a day (TID) | ORAL | Status: DC
  Administered 2023-11-23: 0.5 mg via ORAL
  Filled 2023-11-23: qty 1

## 2023-11-23 MED ORDER — LORATADINE 10 MG PO TABS
10.0000 mg | ORAL_TABLET | Freq: Every day | ORAL | Status: DC
  Administered 2023-11-23 – 2023-11-24 (×2): 10 mg via ORAL
  Filled 2023-11-23 (×2): qty 1

## 2023-11-23 MED ORDER — FLUTICASONE PROPIONATE 50 MCG/ACT NA SUSP
1.0000 | Freq: Two times a day (BID) | NASAL | Status: DC
  Administered 2023-11-23 – 2023-11-24 (×3): 1 via NASAL
  Filled 2023-11-23: qty 16

## 2023-11-23 MED ORDER — ALPRAZOLAM 0.5 MG PO TABS
0.5000 mg | ORAL_TABLET | Freq: Three times a day (TID) | ORAL | Status: DC
  Administered 2023-11-23 – 2023-11-24 (×2): 0.5 mg via ORAL
  Filled 2023-11-23 (×2): qty 1

## 2023-11-23 MED ORDER — TIZANIDINE HCL 4 MG PO TABS
4.0000 mg | ORAL_TABLET | Freq: Three times a day (TID) | ORAL | Status: DC | PRN
  Administered 2023-11-23 – 2023-11-24 (×2): 4 mg via ORAL
  Filled 2023-11-23 (×2): qty 1

## 2023-11-23 MED ORDER — ALBUTEROL SULFATE (2.5 MG/3ML) 0.083% IN NEBU
2.5000 mg | INHALATION_SOLUTION | RESPIRATORY_TRACT | Status: DC | PRN
  Administered 2023-11-24: 2.5 mg via RESPIRATORY_TRACT
  Filled 2023-11-23: qty 3

## 2023-11-23 NOTE — Plan of Care (Signed)
  Problem: Education: Goal: Ability to describe self-care measures that may prevent or decrease complications (Diabetes Survival Skills Education) will improve Outcome: Progressing   Problem: Coping: Goal: Ability to adjust to condition or change in health will improve Outcome: Progressing   Problem: Fluid Volume: Goal: Ability to maintain a balanced intake and output will improve Outcome: Progressing   Problem: Health Behavior/Discharge Planning: Goal: Ability to identify and utilize available resources and services will improve Outcome: Progressing Goal: Ability to manage health-related needs will improve Outcome: Progressing   Problem: Nutritional: Goal: Maintenance of adequate nutrition will improve Outcome: Progressing Goal: Progress toward achieving an optimal weight will improve Outcome: Progressing   Problem: Tissue Perfusion: Goal: Adequacy of tissue perfusion will improve Outcome: Progressing   Problem: Education: Goal: Knowledge of General Education information will improve Description: Including pain rating scale, medication(s)/side effects and non-pharmacologic comfort measures Outcome: Progressing   Problem: Health Behavior/Discharge Planning: Goal: Ability to manage health-related needs will improve Outcome: Progressing   Problem: Clinical Measurements: Goal: Ability to maintain clinical measurements within normal limits will improve Outcome: Progressing Goal: Will remain free from infection Outcome: Progressing Goal: Diagnostic test results will improve Outcome: Progressing Goal: Respiratory complications will improve Outcome: Progressing Goal: Cardiovascular complication will be avoided Outcome: Progressing

## 2023-11-23 NOTE — Consult Note (Signed)
 [facility] Psychiatric Consult Follow-up  Patient Name: .Carol Paul  MRN: [id number]  DOB: 05/18/1996  Consult Order details:  Orders (From admission, onward)     Start     Ordered   11/22/23 1125  IP CONSULT TO PSYCHIATRY       Ordering Provider: Guerry Leek, MD  Provider:  (Not yet assigned)  Question Answer Comment  Location [facility]   Reason for Consult? suicide attempt      11/22/23 1124             Mode of Visit: In person    Psychiatry Consult Evaluation  Service Date: Nov 23, 2023 LOS:  LOS: 2 days  Chief Complaint "I wanted to kill myself because I'm stressed out and overwhelmed taking care of my baby alone as I don't have anyone to support me"  Primary Psychiatric Diagnoses  Major depressive disorder PTSD 2.  Panic disorder   Assessment  Carol Paul is a 28 y.o. female admitted: Medicallyfor 11/21/2023  6:34 AM for suicide attempts. She carries the psychiatric diagnoses of MDD, panic disorder, PTSD,Borderline personality disorder and Cannabis use abuse has a past medical history of  hypertension and T2DM. Psychiatry consulted for suicide attempt.   Her current presentation of recent suicide attempts, stress, depressive symptoms, feeling overwhelmed and recent separation from wife is most consistent with Severe major depressive disorder. Patient meets criteria for inpatient admission based on recent suicide attempt, active depressive symptoms, lack of social supports, inability to contract for safety in the context of ongoing psychosocial stressors.  Current outpatient psychotropic medications include Wellbutrin XL 300 mg daily, Xanax  0.5 mg three times daily and Clonidine  0.1 mg 2-3 times daily and historically she has had a moderate response to these medications. She was  compliant with medications prior to admission as evidenced by report from patient. On initial examination, patient alert and oriented x 3. Mood is "worried"  with labile affect. Please see plan below for detailed recommendations.    11/23/2023: Patient seen face to face in her hospital room with the 1:1 sitter at bedside. Patient is awake, alert and oriented x 4. Patient reports ongoing anxiety, apprehensions, nervousness, irritability and racing thoughts and requested for Xanax  for anxiety. Of note, this Clinical research associate informed patient the day before that Xanax  is not the best medication for panic disorder as she would continue to experience frequent withdrawal symptoms due to its short half life. As a result, Xanax  was switched to Clonazepam which is a long acting BZD with less risk of withdrawal. Review of EMR shows that Clonazepam was given last night but was not given this morning for no apparent reason. Overall, patient denies delusions, hallucinations, and other percentual abnormalities. She is unable to contract for safety and continue to endorse suicidal ideation. Patient would be transferred to psychiatric unit once she is medically stable.      Diagnoses:  Active Hospital problems: Principal Problem:   Intentional overdose (HCC)    Plan   ## Psychiatric Medication Recommendations:  -Start Wellbutrin XL 300 mg every morning for depression -Clonazepam 0.5 mg twice daily for panic disorder with less risk of withdrawal. -Please discontinue Xanax  to avoid frequent withdrawals due to its short half life.  -Continue Clonidine  0.1 mg 2 times daily.  -Monitor for withdrawal symptoms and vitals.  -Please consider transfer to inpatient psychiatric unit once patient is medically cleared. -Consider TOC/Social worker consult to facilitate inpatient transfer.     ## Medical Decision  Making Capacity: Not specifically addressed in this encounter  ## Further Work-up:  --  TSH, B12, folate -- most recent EKG on 11/22/2023 had QtC of 451 -- Pertinent labwork reviewed earlier this admission includes:   ## Disposition:-- We recommend inpatient psychiatric  hospitalization when medically cleared. Patient is under voluntary admission status at this time; please IVC if attempts to leave hospital.  ## Behavioral / Environmental: -Delirium Precautions: Delirium Interventions for Nursing and Staff: - RN to open blinds every AM. - To Bedside: Glasses, hearing aide, and pt's own shoes. Make available to patients. when possible and encourage use. - Encourage po fluids when appropriate, keep fluids within reach. - OOB to chair with meals. - Passive ROM exercises to all extremities with AM & PM care. - RN to assess orientation to person, time and place QAM and PRN. - Recommend extended visitation hours with familiar family/friends as feasible. - Staff to minimize disturbances at night. Turn off television when pt asleep or when not in use.    ## Safety and Observation Level:  - Based on my clinical evaluation, I estimate the patient to be at high risk of self harm in the current setting. - At this time, we recommend  1:1 Observation. This decision is based on my review of the chart including patient's history and current presentation, interview of the patient, mental status examination, and consideration of suicide risk including evaluating suicidal ideation, plan, intent, suicidal or self-harm behaviors, risk factors, and protective factors. This judgment is based on our ability to directly address suicide risk, implement suicide prevention strategies, and develop a safety plan while the patient is in the clinical setting. Please contact our team if there is a concern that risk level has changed.  CSSR Risk Category:C-SSRS RISK CATEGORY: High Risk  Suicide Risk Assessment: Patient has following modifiable risk factors for suicide: active suicidal ideation, under treated depression , and social isolation, which we are addressing by prescribing medications. Patient has following non-modifiable or demographic risk factors for suicide: separation or divorce Patient  has the following protective factors against suicide: Access to outpatient mental health care and no history of suicide attempts  Thank you for this consult request. Recommendations have been communicated to the primary team.  We will follow up at this time.   Windy Hatchet, MD       History of Present Illness  Relevant Aspects of [facility] Course:   Patient Report:  Patient seen face to face in her hospital room with her friend at bedside. Patient is awake, alert and oriented to time, place, person and situation. Patient reports she tried to kill herself by overdosing because her "wife" left her 4 weeks ago due to frequent argument and misunderstanding. She reports that her wife calls her name, yells at her over every little thing and eventually told her she is no more interested. Patient explains further that she has an 36 week old baby and she's been very stressed and overwhelmed taking care of her alone because she has no other support system as her parents abandoned her. As a result, patient states she decided to end her life by taking 3 pills of Xanax  0.5 mg and multiple pills of Tizanidine and Nifedipine. Patient reports life has been very stressful for her due to her chronic history of trauma since childhood, and loss of Triplet in 2024. Patient reports ongoing depressed mood, lack of motivation, low energy level, worry and apprehension about her baby who is being taken  care of by the mother of her wife.She reports history of para suicidal behavior by cutting but denies history of previous suicide attempts.   Patient reports psychiatric history of major depression, panic disorder, PTSD, and recent diagnosis of borderline personality disorder. She follows up with a psychiatrist and recently started therapy. Her current outpatient medications in Wellbutrin XL 300 mg daily, Xanax  0.5 mg three times daily and Clonidine  0.1 mg 2-3 times daily.    Review of Systems   Psychiatric/Behavioral:  Positive for depression, substance abuse and suicidal ideas. Negative for hallucinations. The patient is nervous/anxious.      Psychiatric and Social History  Psychiatric History:  Information collected from patient  Prev Dx/Sx: PTSD,MDD, Panic disorder, and Borderline personality disorder.  Current Psych Provider: patient follows up with psychiatrist and therapist Home Meds (current): Wellbutrin XL 300 mg daily, Xanax  0.5 mg three times daily and Clonidine  0.1 mg 2-3 times daily.  Previous Med Trials: unknown  Therapy: yes  Prior Psych Hospitalization: denies   Prior Self Harm: yes, by cutting  Prior Violence: denies   Family Psych History: denies  Family Hx suicide: denies   Social History:  Educational Hx: high school graduate Occupational Hx: unemployed  Armed forces operational officer Hx: denies  Living Situation: lives alone  Spiritual Hx: unknown  Access to weapons/lethal means: patient denies    Substance History Alcohol: social drinker   Type of alcohol beer Last Drink weeks ago Number of drinks per day socially History of alcohol withdrawal seizures denies  History of DT's denies  Tobacco: denies  Illicit drugs: Cannabis 1 blunt weekly Prescription drug abuse: denies  Rehab hx: denies   Exam Findings   Vital Signs:  Temp:  [98.2 F (36.8 C)-100.4 F (38 C)] 98.5 F (36.9 C) (05/18 1347) Pulse Rate:  [43-123] 95 (05/18 1347) Resp:  [11-25] 16 (05/18 1347) BP: (74-143)/(44-82) 111/81 (05/18 1347) SpO2:  [89 %-100 %] 98 % (05/18 1347) Weight:  [92.6 kg] 92.6 kg (05/18 0112) Blood pressure 111/81, pulse 95, temperature 98.5 F (36.9 C), temperature source Oral, resp. rate 16, height 5\' 2"  (1.575 m), weight 92.6 kg, last menstrual period 12/16/2022, SpO2 98%, unknown if currently breastfeeding. Body mass index is 37.34 kg/m.  Physical Exam  Mental Status Exam: General Appearance: Casual  Orientation:  Full (Time, Place, and Person)  Memory:   Immediate;   Good  Concentration:  Concentration: Good and Attention Span: Good  Recall:  Good  Attention  Good  Eye Contact:  Good  Speech:  Normal Rate  Language:  Good  Volume:  Normal  Mood: "worried"  Affect:  Depressed  Thought Process:  Linear  Thought Content:  Logical  Suicidal Thoughts:  Yes.  with intent/plan  Homicidal Thoughts:  No  Judgement:  Poor  Insight:  Lacking  Psychomotor Activity:  Normal  Akathisia:  No  Fund of Knowledge:  Good      Assets:  Communication Skills  Cognition:  WNL  ADL's:  Intact  AIMS (if indicated):        Other History   These have been pulled in through the EMR, reviewed, and updated if appropriate.  Family History:  The patient's family history includes CAD in an other family member; Healthy in her mother; Hypothyroidism in her father.  Medical History: Past Medical History:  Diagnosis Date   Anxiety    Asthma    Borderline personality disorder in adult [facility]) 2025   diagnosed several days PTA   Diabetes mellitus without complication (  HCC)    Hypertension    Hyperthyroidism    Ovarian cyst    UTI (urinary tract infection)     Surgical History: Past Surgical History:  Procedure Laterality Date   ANKLE SURGERY Left    CESAREAN SECTION  2025   Febuary 25th   TONSILLECTOMY       Medications:   Current Facility-Administered Medications:    0.9 %  sodium chloride  infusion, 250 mL, Intravenous, Continuous, Paliwal, Aditya, MD, Stopped at 11/23/23 0943   acetaminophen  (TYLENOL ) suppository 650 mg, 650 mg, Rectal, Q4H PRN, Hunsucker, Archer Kobs, MD, 650 mg at 11/21/23 2132   acetaminophen  (TYLENOL ) tablet 650 mg, 650 mg, Oral, Q4H PRN, Hunsucker, Archer Kobs, MD, 650 mg at 11/22/23 1537   albuterol  (VENTOLIN  HFA) 108 (90 Base) MCG/ACT inhaler 2 puff, 2 puff, Inhalation, Q4H PRN, Hunsucker, Archer Kobs, MD   arformoterol  (BROVANA ) nebulizer solution 15 mcg, 15 mcg, Nebulization, BID, Hunsucker, Archer Kobs, MD, 15 mcg at  11/23/23 1610   budesonide  (PULMICORT ) nebulizer solution 0.5 mg, 0.5 mg, Nebulization, BID, Hunsucker, Archer Kobs, MD, 0.5 mg at 11/23/23 9604   buPROPion (WELLBUTRIN XL) 24 hr tablet 300 mg, 300 mg, Oral, Daily, Hunsucker, Archer Kobs, MD, 300 mg at 11/23/23 5409   Chlorhexidine  Gluconate Cloth 2 % PADS 6 each, 6 each, Topical, Daily, Hunsucker, Archer Kobs, MD, 6 each at 11/22/23 2100   clonazePAM (KLONOPIN) disintegrating tablet 0.5 mg, 0.5 mg, Oral, BID, Hunsucker, Archer Kobs, MD, 0.5 mg at 11/23/23 1324   docusate sodium  (COLACE) capsule 100 mg, 100 mg, Oral, BID PRN, Hunsucker, Archer Kobs, MD   enoxaparin  (LOVENOX ) injection 40 mg, 40 mg, Subcutaneous, Q24H, Hunsucker, Archer Kobs, MD, 40 mg at 11/23/23 0915   famotidine  (PEPCID ) tablet 20 mg, 20 mg, Oral, BID PRN, Danford, Christopher P, MD   fluticasone (FLONASE) 50 MCG/ACT nasal spray 1 spray, 1 spray, Each Nare, BID, Danford, Willis Harter, MD, 1 spray at 11/23/23 0915   ipratropium-albuterol  (DUONEB) 0.5-2.5 (3) MG/3ML nebulizer solution 3 mL, 3 mL, Nebulization, Q6H PRN, Hunsucker, Archer Kobs, MD   levothyroxine  (SYNTHROID ) tablet 50 mcg, 50 mcg, Oral, Q0600, Danford, Willis Harter, MD, 50 mcg at 11/23/23 0844   loratadine (CLARITIN) tablet 10 mg, 10 mg, Oral, Daily, Danford, Christopher P, MD, 10 mg at 11/23/23 0914   melatonin tablet 5 mg, 5 mg, Oral, QHS PRN, Paliwal, Aditya, MD, 5 mg at 11/22/23 2223   ondansetron  (ZOFRAN ) injection 4 mg, 4 mg, Intravenous, Q6H PRN, Hunsucker, Archer Kobs, MD   Oral care mouth rinse, 15 mL, Mouth Rinse, PRN, Hunsucker, Archer Kobs, MD   polyethylene glycol (MIRALAX  / GLYCOLAX ) packet 17 g, 17 g, Oral, Daily PRN, Hunsucker, Archer Kobs, MD   revefenacin (YUPELRI) nebulizer solution 175 mcg, 175 mcg, Nebulization, Daily, Hunsucker, Archer Kobs, MD, 175 mcg at 11/23/23 8119  Allergies: Allergies  Allergen Reactions   Peanut Oil Anaphylaxis   Sesame Oil Anaphylaxis   Sesame Seed (Diagnostic) Anaphylaxis    Shellfish Allergy Anaphylaxis   Soy Allergy (Obsolete) Anaphylaxis   Soybean Oil Anaphylaxis   Silicone Dermatitis   Egg-Derived Products Diarrhea, Nausea Only and Other (See Comments)    GI discomfort   Milk (Cow) Diarrhea, Nausea Only and Other (See Comments)    GI discomfort   Reglan  [Metoclopramide ] Swelling    Face & Extremeties   Wheat Diarrhea, Nausea Only and Other (See Comments)    GI discomfort   Prochlorperazine  Anxiety    Pt wants noted in her chart that  she does not want to take this medication again    Sadey Yandell A Anand Tejada, MD

## 2023-11-23 NOTE — Progress Notes (Signed)
 Pt requested that her home meds be restarted. They are as follows:  Nifedipine 60mg  AM Wellbutrin XR 300mg  AM Clonidine  PRN for sleep Xanax  .5mg  3x daily PRN Albuterol  PRN Spiriva AM Symbicort BID AM/PM Tinazidine 4mg  BID PRN Magnesium  PRN PM Zofran  PRN Flonase BID AM/PM Claritin AM Phenergan  25mg  PRN Pepcid  PRN Epipen  PRN Vitamin D AM Synthroid  50mcg AM Benadryl  PRN Metformin 

## 2023-11-23 NOTE — Progress Notes (Signed)
 PT Cancellation Note  Patient Details Name: Carol Paul MRN: [id number] DOB: 1996/04/17   Cancelled Treatment:    Reason Eval/Treat Not Completed: PT screened, no needs identified, will sign off per MD recommendation.    Tanda Falter, PT Acute Rehabilitation  Office: [phone]

## 2023-11-23 NOTE — TOC Progression Note (Addendum)
 Transition of Care [facility]) - Progression Note    Patient Details  Name: Carol Paul MRN: [id number] Date of Birth: [birth year]  [facility] CM/SW Contact  Bari Leys, RN Phone Number: 11/23/2023, 12:29 PM  Clinical Narrative:  Teams chat received from bedside nurse, patient arrived to unit, requesting to speak with a therapist and requesting [facility] to [facility]. Per attending, patient medically stable for transfer. NCM called to [facility], [facility] [place], reports will need referral for inpatient admission review, directed NCM to call [phone] for referral process, NCM called number, [facility] [place] [facility] SW, provided information on referral process to [facility], need to fax H&P, Psych Consult/Referral, vital signs, face sheet to [phone], documentation faxed as request, await determination .  -4:04pm Call received from [facility] with [facility], [phone],  with accepting information. Admitting MD: Dr Percilla Boys, RM- Herbert L[facility]. Call Report [phone], can admit tomorrow after 8am, team notified, patient notified.       Expected Discharge Plan:  (TBD) Barriers to Discharge: Continued Medical Work up  Expected Discharge Plan and Services In-house Referral: Clinical Social Work     Living arrangements for the past 2 months: Apartment                 DME Arranged: N/A DME Agency: NA                   Social Determinants of Health (SDOH) Interventions SDOH Screenings   Food Insecurity: Patient Unable To Answer (11/21/2023)  Housing: Patient Unable To Answer (11/21/2023)  Recent Concern: Housing - High Risk (09/01/2023)   Received from [facility]  Transportation Needs: Patient Unable To Answer (11/21/2023)  Utilities: Not At Risk (10/16/2023)   Received from [facility]  Financial Resource Strain: Low Risk  (10/16/2023)   Received from [facility]  Physical  Activity: Insufficiently Active (04/09/2023)   Received from [facility]  Social Connections: Moderately Integrated (04/09/2023)   Received from [facility]  Recent Concern: Social Connections - Somewhat Isolated (01/23/2023)   Received from [facility]  Stress: Stress Concern Present (04/09/2023)   Received from [facility]  Tobacco Use: Medium Risk (11/21/2023)  Health Literacy: Adequate Health Literacy (04/09/2023)   Received from [facility]    Readmission Risk Interventions    11/22/2023    1:55 PM 12/02/2022   10:25 AM  Readmission Risk Prevention Plan  Transportation Screening Complete Complete  PCP or Specialist Appt within 3-5 Days  Complete  HRI or Home Care Consult Complete Complete  Social Work Consult for Recovery Care Planning/Counseling Complete Complete  Palliative Care Screening Not Applicable Not Applicable  Medication Review Oceanographer) Complete Referral to Pharmacy

## 2023-11-23 NOTE — Care Plan (Signed)
 Discussed blood pressure with nursing. Asymptomatic, BP soft 90s.  No orthostasis until standing three minutes, then down to high 80s.  Still asymptomatic.    For now, this is expected, labs are unremarkable.  Will follow todays BMP, but if no changes, will just observe this BP for now.  AVOID nifedipine, clonidine .

## 2023-11-23 NOTE — Hospital Course (Signed)
 28 y.o. F with MO, asthma, HTN, allergies, hypothyroidism, PCOS who presented with intentional overdose of nifedipine, Xanax , and Wellbutrin.  Intubated in the ER.

## 2023-11-23 NOTE — Progress Notes (Signed)
  Progress Note   Patient: Carol Paul RUE:[id number] DOB: 1996-04-28 DOA: 11/21/2023     2 DOS: the patient was seen and examined on 11/23/2023 at 8:20AM      Brief hospital course: 28 y.o. F with MO, asthma, HTN, allergies, hypothyroidism, PCOS who presented with intentional overdose of nifedipine, Xanax , and Wellbutrin.  Intubated in the ER.     Assessment and Plan: Suicide attempt with overdose of Wellbutrin, nifedipine, and Xanax  Blood pressure gradually improving in the last 24 hours, now normal - Hold nifedipine - Avoid clonidine , prazosin or other alpha blockers for treatment of anxiety or PTSD - Trend blood pressure  Anxiety - Continue bupropion, Klonopin  Asthma No evidence of flare - Continue home ICS/LABA/LAMA - Continue famotidine , Flonase, Claritin  Class II obesity BMI 37 in the setting of comorbid PCOS, gestational hypertension  Gestational hypertension Blood pressure low - Hold nifedipine  PCOS/prediabetes - Hold metformin  in the hospital  Hypothyroidism - Continue levothyroxine          Subjective: Asymptomatic transient fever overnight, no cough, no dyspnea, no other symptoms, feels normal    Physical Exam: BP 111/81 (BP Location: Left Arm)   Pulse 95   Temp 98.5 F (36.9 C) (Oral)   Resp 16   Ht 5\' 2"  (1.575 m)   Wt 92.6 kg   LMP 12/16/2022   SpO2 98%   Breastfeeding Unknown   BMI 37.34 kg/m   Adult female, sitting up in bed, interactive and appropriate RRR, no murmurs, no peripheral edema Respiratory normal, lungs clear without rales or wheezes Abdomen without distention or guarding Attention normal, affect normal, judgment and insight appear normal, moves all extremities with normal strength and coordination    Data Reviewed: Chest x-ray shows no infiltrate Basic metabolic panel shows mild hypokalemia CBC shows no leukocytosis or anemia Discussed with psychiatry   Family Communication:       Disposition: Status is: Inpatient Medically cleared  Awaiting psychiatric bed for inpatient psychiatric treatment after suicide attempt        Author: Ephriam Hashimoto, MD 11/23/2023 3:21 PM  For on call review [email or web address].

## 2023-11-23 NOTE — Care Plan (Signed)
 Blood pressure has normalized.  Vitals signs WNL and improved.  She is independently ambulatory and asymptomatic.  CXR without infiltrate.    The patient is medically cleared for psychiatric treatment whenever a bed is available.

## 2023-11-24 DIAGNOSIS — T50902A Poisoning by unspecified drugs, medicaments and biological substances, intentional self-harm, initial encounter: Secondary | ICD-10-CM | POA: Diagnosis not present

## 2023-11-24 LAB — GLUCOSE, CAPILLARY: Glucose-Capillary: 108 mg/dL — ABNORMAL HIGH (ref 70–99)

## 2023-11-24 MED ORDER — LORATADINE 10 MG PO TABS: 10.0000 mg | ORAL_TABLET | Freq: Every day | ORAL | Status: AC

## 2023-11-24 NOTE — Discharge Summary (Signed)
 Physician Discharge Summary   Patient: Carol Paul MRN: [id number] DOB: [birth year]  Admit date:     11/21/2023  Discharge date: 11/24/23  Discharge Physician: Ephriam Hashimoto   PCP: Patient, No Pcp Per     Recommendations at discharge:  Follow up with PCP within 1 week of discharge Discuss resumption of nifedipine with PCP after discharge from psychiatry Avoid clonidine , prazosin or other alpha-blockers for anxiety/mental health indications until BP persistently >120/80 mmHg     Discharge Diagnoses: Principal Problem:   Intentional overdose of Wellbutrin , nifedipine, and Xanax  (HCC) Other hospital problems:   Anxiety   Asthma   Class II obesity   Gestational hypertension   PCOS   Hypothyroidism      Hospital Course: 28 y.o. F with MO, asthma, HTN, allergies, hypothyroidism, PCOS who presented with intentional overdose of nifedipine, Xanax , and Wellbutrin .  Intubated in the ER.    Suicide attempt with overdose of Wellbutrin , nifedipine, and Xanax  Initially intubated and admitted to ICU on pressors due to hypotension.  Extubated quickly, and blood pressure gradually improved.    By the time of discharge, she was ambulating without symptoms, breathing well, had no respiratory symptoms, no fever for 24 hours, and felt at baseline.  BP improved to 100s systolic.  - Hold nifedipine - Avoid clonidine , prazosin or other alpha blockers for treatment of anxiety or PTSD  Evaluated by Psychiatry who resumed Wellbutrin  and started Klonopin  and recommended inpatient treatment.    Asthma No evidence of flare.  Stable on home ICS/LABA/LAMA, Flonase , claritin  and Pepcid .  She did have a transient low grade fever after extubation, but no cough, no dyspnea, no chest pain, no infiltrate on CXR and was asymptomatic.      Class II obesity BMI 37 in the setting of comorbid PCOS, gestational hypertension   Gestational hypertension - Hold nifedipine    PCOS/prediabetes Resume metformin    Hypothyroidism Stable on levothyroxine         [place]  Controlled Substances Registry was reviewed for this patient prior to discharge.  Consultants: Psychiatry Critirical Care  Procedures performed: Intubation Extubation   Disposition: Inpatient Psychiatry Diet recommendation:  Regular diet  DISCHARGE MEDICATION: Allergies as of 11/24/2023       Reactions   Peanut Oil Anaphylaxis   Sesame Oil Anaphylaxis   Sesame Seed (diagnostic) Anaphylaxis   Shellfish Allergy Anaphylaxis   Soy Allergy (obsolete) Anaphylaxis   Soybean Oil Anaphylaxis   Silicone Dermatitis   Egg-derived Products Diarrhea, Nausea Only, Other (See Comments)   GI discomfort   Milk (cow) Diarrhea, Nausea Only, Other (See Comments)   GI discomfort   Reglan  [metoclopramide ] Swelling   Face & Extremeties   Wheat Diarrhea, Nausea Only, Other (See Comments)   GI discomfort   Prochlorperazine  Anxiety   Pt wants noted in her chart that she does not want to take this medication again        Medication List     PAUSE taking these medications    cloNIDine  0.1 MG tablet Wait to take this until your doctor or other care provider tells you to start again. Commonly known as: CATAPRES  Take 0.1 mg by mouth at bedtime.   NIFEdipine 60 MG 24 hr tablet Wait to take this until your doctor or other care provider tells you to start again. Commonly known as: PROCARDIA XL/NIFEDICAL XL Take 60 mg by mouth.       TAKE these medications    alprazolam  2 MG tablet Commonly known as:  XANAX  Take 1 mg by mouth every 8 (eight) hours as needed for anxiety.   amphetamine-dextroamphetamine 20 MG tablet Commonly known as: ADDERALL Take 1 tablet by mouth.   buPROPion  300 MG 24 hr tablet Commonly known as: WELLBUTRIN  XL Take 300 mg by mouth daily.   diphenhydrAMINE  25 MG tablet Commonly known as: BENADRYL  Take 50 mg by mouth daily as needed (following  anaphylactic reaction).   EPINEPHrine  0.3 mg/0.3 mL Soaj injection Commonly known as: EPI-PEN Inject 0.3 mg into the muscle as needed for anaphylaxis.   famotidine  20 MG tablet Commonly known as: PEPCID  Take 20 mg by mouth 2 (two) times daily.   fluticasone  50 MCG/ACT nasal spray Commonly known as: FLONASE  Place 2 sprays into the nose.   hydrOXYzine  25 MG tablet Commonly known as: ATARAX  Take 25 mg by mouth 3 (three) times daily as needed for anxiety.   levothyroxine  25 MCG tablet Commonly known as: SYNTHROID  Take 25-50 mcg by mouth See admin instructions. Take 25 mcg (1 tablet) once daily before breakfast, except take 50 mcg (2 tablets) before breakfast on Monday and Friday.   loratadine  10 MG tablet Commonly known as: CLARITIN  Take 1 tablet (10 mg total) by mouth daily. Start taking on: Nov 25, 2023   magnesium  oxide 400 MG tablet Commonly known as: MAG-OX Take 1 tablet by mouth at bedtime.   metFORMIN  500 MG tablet Commonly known as: GLUCOPHAGE  Take 500 mg by mouth 2 (two) times daily.   Nurtec 75 MG Tbdp Generic drug: Rimegepant Sulfate Take 75 mg by mouth.   ondansetron  4 MG disintegrating tablet Commonly known as: ZOFRAN -ODT Take 1 tablet (4 mg total) by mouth every 8 (eight) hours as needed for nausea or vomiting.   ondansetron  4 MG tablet Commonly known as: ZOFRAN  Take 4 mg by mouth every 8 (eight) hours as needed for nausea or vomiting.   promethazine  25 MG tablet Commonly known as: PHENERGAN  Take 1 tablet (25 mg total) by mouth every 6 (six) hours as needed for nausea or vomiting.   promethazine  25 MG suppository Commonly known as: PHENERGAN  Place 1 suppository (25 mg total) rectally every 6 (six) hours as needed for nausea or vomiting.   Spiriva Respimat 1.25 MCG/ACT Aers Generic drug: Tiotropium Bromide Monohydrate SMARTSIG:2 inhalation Via Inhaler Daily   Symbicort 160-4.5 MCG/ACT inhaler Generic drug: budesonide -formoterol  Inhale 2 puffs into  the lungs 2 (two) times daily as needed.   tiZANidine  4 MG tablet Commonly known as: ZANAFLEX  Take 4 mg by mouth 2 (two) times daily as needed.   Ventolin  HFA 108 (90 Base) MCG/ACT inhaler Generic drug: albuterol  Inhale 1-2 puffs into the lungs every 4 (four) hours as needed for wheezing or shortness of breath.   albuterol  (2.5 MG/3ML) 0.083% nebulizer solution Commonly known as: PROVENTIL  Take 3 mLs (2.5 mg total) by nebulization every 6 (six) hours as needed for wheezing or shortness of breath.        Follow-up Information     Anyanwu, Kathrine Paris, MD Follow up.   Specialty: Obstetrics and Gynecology Contact information: [address] [place] [address] [phone]                 Discharge Instructions     Increase activity slowly   Complete by: As directed        Discharge Exam: Filed Weights   11/21/23 0928 11/22/23 0440 11/23/23 0112  Weight: 91.2 kg 90.8 kg 92.6 kg    General: Pt is alert, awake, not in  acute distress Cardiovascular: RRR, nl S1-S2, no murmurs appreciated.   No LE edema.   Respiratory: Normal respiratory rate and rhythm.  CTAB without rales or wheezes. Abdominal: Abdomen soft and non-tender.  No distension or HSM.   Neuro/Psych: Strength symmetric in upper and lower extremities.  Judgment and insight appear normal.   Condition at discharge: good  The results of significant diagnostics from this hospitalization (including imaging, microbiology, ancillary and laboratory) are listed below for reference.   Imaging Studies: DG Chest 2 View Result Date: 11/23/2023 CLINICAL DATA:  200808 Hypoxia [id number] Per order: hypoxia. Pt BIB EMS two days ago for intentional overdose. EXAM: CHEST - 2 VIEW COMPARISON:  Chest x-ray 11/21/2023 FINDINGS: The heart and mediastinal contours are unchanged. No definite focal consolidation. No pulmonary edema. Nonspecific blunting of left costophrenic angle. Otherwise no significant pleural effusion. No  pneumothorax. No acute osseous abnormality. IMPRESSION: 1. No acute cardiopulmonary abnormality. 2. Nonspecific blunting of left costophrenic angle. Possible trace underlying pleural effusion. Electronically Signed   By: Morgane  Naveau M.D.   On: 11/23/2023 11:35   DG Abdomen 1 View Result Date: 11/21/2023 CLINICAL DATA:  NG tube placement. EXAM: ABDOMEN - 1 VIEW COMPARISON:  06/20/2015 FINDINGS: The NG tube passes into the stomach although the distal tip position is not included on the film. Moderate gaseous distention of the stomach noted. Some minimal probable atelectasis in the lung bases. IMPRESSION: NG tube passes into the stomach although the distal tip position is not included on the film. Proximal side port of the NG tube is below the GE junction. Electronically Signed   By: Donnal Fusi M.D.   On: 11/21/2023 07:39   DG Chest Port 1 View Result Date: 11/21/2023 CLINICAL DATA:  Overdose. EXAM: PORTABLE CHEST 1 VIEW COMPARISON:  12/02/2022 FINDINGS: Endotracheal tube tip is approximately 3.2 cm above the base of the carina. NG tube is folded on itself at the level of the carina and tracks cranially back up the chest into the neck without visualization of the distal tip is it is off the cranial margin of the film. The NG tube could be in the trachea or the esophagus based on appearance. Cardiopericardial silhouette is at upper limits of normal for size. The lungs are clear without focal pneumonia, edema, pneumothorax or pleural effusion. Telemetry leads overlie the chest. IMPRESSION: 1. Endotracheal tube tip is approximately 3.2 cm above the base of the carina. 2. NG tube is folded on itself at the level of the carina and tracks cranially back up the chest into the neck without visualization of the distal tip is it is off the cranial margin of the film. The NG tube could be in the trachea or the esophagus based on appearance. 3. Critical Value/emergent results were called by telephone at the time of  interpretation on 11/21/2023 at 7:37 am to provider Dr. Reba Camper, who verbally acknowledged these results and confirmed that NG tube was already being repositioned. Electronically Signed   By: Donnal Fusi M.D.   On: 11/21/2023 07:37    Microbiology: Results for orders placed or performed during the hospital encounter of 11/21/23  MRSA Next Gen by PCR, Nasal     Status: None   Collection Time: 11/21/23  9:20 AM   Specimen: Nasal Mucosa; Nasal Swab  Result Value Ref Range Status   MRSA by PCR Next Gen NOT DETECTED NOT DETECTED Final    Comment: (NOTE) The GeneXpert MRSA Assay (FDA approved for NASAL specimens only), is one component of  a comprehensive MRSA colonization surveillance program. It is not intended to diagnose MRSA infection nor to guide or monitor treatment for MRSA infections. Test performance is not FDA approved in patients less than 48 years old. Performed at [facility], [address], [place], [place] [address]     Labs: CBC: Recent Labs  Lab 11/21/23 0636 11/21/23 0650 11/22/23 0312 11/23/23 0853  WBC 17.5*  --  12.0* 7.8  NEUTROABS 13.2*  --   --   --   HGB 14.3 15.6* 13.0 13.1  HCT 43.0 46.0 42.2 41.8  MCV 90.0  --  95.5 94.4  PLT 335  --  239 217   Basic Metabolic Panel: Recent Labs  Lab 11/21/23 0636 11/21/23 0650 11/22/23 0312 11/23/23 0853  NA 130* 134* 138 138  K 3.2* 3.5 3.5 3.2*  CL 95* 98 106 103  CO2 23  --  21* 23  GLUCOSE 285* 296* 156* 99  BUN 9 8 10 9   CREATININE 0.98 1.00 1.30* 1.04*  CALCIUM  9.3  --  8.6* 8.9  MG 2.3  --  1.9  --   PHOS  --   --  5.3*  --    Liver Function Tests: Recent Labs  Lab 11/21/23 0636 11/23/23 0853  AST 40 16  ALT 70* 33  ALKPHOS 88 80  BILITOT 1.1 0.8  PROT 8.0 6.3*  ALBUMIN 4.4 3.2*   CBG: Recent Labs  Lab 11/23/23 0727 11/23/23 1138 11/23/23 1553 11/23/23 2154 11/24/23 0743  GLUCAP 127* 136* 122* 133* 108*    Discharge time spent: approximately 45 minutes  spent on discharge counseling, evaluation of patient on day of discharge, and coordination of discharge planning with nursing, social work, pharmacy and case management  Signed: Ephriam Hashimoto, MD [facility] 11/24/2023

## 2023-11-24 NOTE — Progress Notes (Signed)
 [facility] picked patient up to transport to receiving facility.

## 2023-11-24 NOTE — Progress Notes (Signed)
 Primary RN called receiving facility and gave report, awaiting transport to pick patient up.

## 2023-11-24 NOTE — TOC Transition Note (Signed)
 Transition of Care [facility]) - Discharge Note   Patient Details  Name: Carol Paul MRN: [id number] Date of Birth: 22-Aug-1995  [facility] CM/SW Contact:  Delilah Fend, LCSW Phone Number: 11/24/2023, 10:37 AM   Clinical Narrative:     Confirmed with [facility] that inpatient bed ready today for patient and pt is medically cleared for dc.  RN has called report.  [facility] called at  10:30am.  No further TOC needs.  Final next level of care: Psychiatric Hospital Barriers to Discharge: Barriers Resolved   Patient Goals and CMS Choice Patient states their goals for this hospitalization and ongoing recovery are:: stabilize          Discharge Placement                Patient to be transferred to facility by: [facility]      Discharge Plan and Services Additional resources added to the After Visit Summary for   In-house Referral: Clinical Social Work              DME Arranged: N/A DME Agency: NA                  Social Drivers of Health (SDOH) Interventions SDOH Screenings   Food Insecurity: Patient Unable To Answer (11/21/2023)  Housing: Patient Unable To Answer (11/21/2023)  Recent Concern: Housing - High Risk (09/01/2023)   Received from [facility]  Transportation Needs: Patient Unable To Answer (11/21/2023)  Utilities: Not At Risk (10/16/2023)   Received from [facility]  Financial Resource Strain: Low Risk  (10/16/2023)   Received from [facility]  Physical Activity: Insufficiently Active (04/09/2023)   Received from [facility]  Social Connections: Moderately Integrated (04/09/2023)   Received from [facility]  Recent Concern: Social Connections - Somewhat Isolated (01/23/2023)   Received from [facility]  Stress: Stress Concern Present (04/09/2023)   Received from [facility]  Tobacco Use: Medium Risk (11/21/2023)  Health Literacy: Adequate Health Literacy  (04/09/2023)   Received from [facility]     Readmission Risk Interventions    11/22/2023    1:55 PM 12/02/2022   10:25 AM  Readmission Risk Prevention Plan  Transportation Screening Complete Complete  PCP or Specialist Appt within 3-5 Days  Complete  HRI or Home Care Consult Complete Complete  Social Work Consult for Recovery Care Planning/Counseling Complete Complete  Palliative Care Screening Not Applicable Not Applicable  Medication Review Oceanographer) Complete Referral to Pharmacy

## 2023-11-24 NOTE — Consult Note (Signed)
 Patient continues to meet criteria for inpatient psychiatric admission. Has been accepted to [facility] for an admission today. Discharge paperwork has been completed. See TOC note for accepting information. Psychiatry consult to sign off at this time.

## 2023-11-24 NOTE — Progress Notes (Signed)
 Patient discharged to [facility] in [place], discharge paperwork provided and explained to patient, patient verbalized understanding, IV removed, copy of discharge paperwork provided in discharge packet, primary RN attempted to call report to receiving facility with no answer, awaiting TOC to call transport to take patient to [facility].

## 2024-02-25 ENCOUNTER — Emergency Department (HOSPITAL_BASED_OUTPATIENT_CLINIC_OR_DEPARTMENT_OTHER)

## 2024-02-25 ENCOUNTER — Other Ambulatory Visit: Payer: Self-pay

## 2024-02-25 ENCOUNTER — Emergency Department (HOSPITAL_BASED_OUTPATIENT_CLINIC_OR_DEPARTMENT_OTHER): Admission: EM | Admit: 2024-02-25 | Discharge: 2024-02-25 | Disposition: A | Discharge status: Home / Self Care

## 2024-02-25 ENCOUNTER — Emergency Department (HOSPITAL_BASED_OUTPATIENT_CLINIC_OR_DEPARTMENT_OTHER): Admitting: Radiology

## 2024-02-25 ENCOUNTER — Encounter (HOSPITAL_BASED_OUTPATIENT_CLINIC_OR_DEPARTMENT_OTHER): Payer: Self-pay | Admitting: Emergency Medicine

## 2024-02-25 DIAGNOSIS — R079 Chest pain, unspecified: Secondary | ICD-10-CM | POA: Insufficient documentation

## 2024-02-25 DIAGNOSIS — E876 Hypokalemia: Secondary | ICD-10-CM | POA: Insufficient documentation

## 2024-02-25 DIAGNOSIS — R112 Nausea with vomiting, unspecified: Secondary | ICD-10-CM | POA: Diagnosis not present

## 2024-02-25 DIAGNOSIS — R1013 Epigastric pain: Secondary | ICD-10-CM | POA: Insufficient documentation

## 2024-02-25 DIAGNOSIS — Z91010 Allergy to peanuts: Secondary | ICD-10-CM | POA: Diagnosis not present

## 2024-02-25 LAB — COMPREHENSIVE METABOLIC PANEL WITH GFR
ALT: 39 U/L (ref 0–44)
AST: 36 U/L (ref 15–41)
Albumin: 5 g/dL (ref 3.5–5.0)
Alkaline Phosphatase: 88 U/L (ref 38–126)
Anion gap: 17 — ABNORMAL HIGH (ref 5–15)
BUN: <5 mg/dL — ABNORMAL LOW (ref 6–20)
CO2: 22 mmol/L (ref 22–32)
Calcium: 9.9 mg/dL (ref 8.9–10.3)
Chloride: 101 mmol/L (ref 98–111)
Creatinine, Ser: 0.7 mg/dL (ref 0.44–1.00)
GFR, Estimated: >60 mL/min (ref >60)
Glucose, Bld: 132 mg/dL — ABNORMAL HIGH (ref 70–99)
Potassium: 3.1 mmol/L — ABNORMAL LOW (ref 3.5–5.1)
Sodium: 139 mmol/L (ref 135–145)
Total Bilirubin: 0.7 mg/dL (ref 0.0–1.2)
Total Protein: 8.2 g/dL — ABNORMAL HIGH (ref 6.5–8.1)

## 2024-02-25 LAB — CBC WITH DIFFERENTIAL/PLATELET
Abs Immature Granulocytes: 0.02 K/uL (ref 0.00–0.07)
Basophils Absolute: 0.1 K/uL (ref 0.0–0.1)
Basophils Relative: 1 %
Eosinophils Absolute: 0.1 K/uL (ref 0.0–0.5)
Eosinophils Relative: 1 %
HCT: 45.2 % (ref 36.0–46.0)
Hemoglobin: 15.8 g/dL — ABNORMAL HIGH (ref 12.0–15.0)
Immature Granulocytes: 0 %
Lymphocytes Relative: 26 %
Lymphs Abs: 2.4 K/uL (ref 0.7–4.0)
MCH: 32.4 pg (ref 26.0–34.0)
MCHC: 35 g/dL (ref 30.0–36.0)
MCV: 92.8 fL (ref 80.0–100.0)
Monocytes Absolute: 0.4 K/uL (ref 0.1–1.0)
Monocytes Relative: 5 %
Neutro Abs: 6.2 K/uL (ref 1.7–7.7)
Neutrophils Relative %: 67 %
Platelets: 282 K/uL (ref 150–400)
RBC: 4.87 MIL/uL (ref 3.87–5.11)
RDW: 13.7 % (ref 11.5–15.5)
WBC: 9.2 K/uL (ref 4.0–10.5)
nRBC: 0 % (ref 0.0–0.2)

## 2024-02-25 LAB — HCG, SERUM, QUALITATIVE: Preg, Serum: NEGATIVE

## 2024-02-25 LAB — TROPONIN T, HIGH SENSITIVITY: Troponin T High Sensitivity: <15 ng/L (ref 0–19)

## 2024-02-25 LAB — LIPASE, BLOOD: Lipase: 32 U/L (ref 11–51)

## 2024-02-25 MED ORDER — IOHEXOL 300 MG/ML  SOLN
100.0000 mL | Freq: Once | INTRAMUSCULAR | Status: AC | PRN
  Administered 2024-02-25: 100 mL via INTRAVENOUS

## 2024-02-25 MED ORDER — DICYCLOMINE HCL 20 MG PO TABS: 20.0000 mg | ORAL_TABLET | Freq: Two times a day (BID) | ORAL | 0 refills | Status: DC

## 2024-02-25 MED ORDER — PANTOPRAZOLE SODIUM 20 MG PO TBEC: 20.0000 mg | DELAYED_RELEASE_TABLET | Freq: Every day | ORAL | 0 refills | Status: AC

## 2024-02-25 MED ORDER — PANTOPRAZOLE SODIUM 40 MG IV SOLR
40.0000 mg | Freq: Once | INTRAVENOUS | Status: AC
  Administered 2024-02-25: 40 mg via INTRAVENOUS
  Filled 2024-02-25: qty 10

## 2024-02-25 MED ORDER — ONDANSETRON 4 MG PO TBDP: 4.0000 mg | ORAL_TABLET | Freq: Three times a day (TID) | ORAL | 0 refills | Status: AC | PRN

## 2024-02-25 MED ORDER — ALUM & MAG HYDROXIDE-SIMETH 200-200-20 MG/5ML PO SUSP
30.0000 mL | Freq: Once | ORAL | Status: AC
  Administered 2024-02-25: 30 mL via ORAL
  Filled 2024-02-25: qty 30

## 2024-02-25 MED ORDER — ONDANSETRON 4 MG PO TBDP: 4.0000 mg | ORAL_TABLET | Freq: Three times a day (TID) | ORAL | 0 refills | Status: DC | PRN

## 2024-02-25 MED ORDER — POTASSIUM CHLORIDE CRYS ER 20 MEQ PO TBCR
40.0000 meq | EXTENDED_RELEASE_TABLET | Freq: Once | ORAL | Status: AC
  Administered 2024-02-25: 40 meq via ORAL
  Filled 2024-02-25: qty 2

## 2024-02-25 MED ORDER — LIDOCAINE VISCOUS HCL 2 % MT SOLN
15.0000 mL | Freq: Once | OROMUCOSAL | Status: AC
  Administered 2024-02-25: 15 mL via ORAL
  Filled 2024-02-25: qty 15

## 2024-02-25 MED ORDER — ONDANSETRON HCL 4 MG/2ML IJ SOLN
4.0000 mg | Freq: Once | INTRAMUSCULAR | Status: AC
  Administered 2024-02-25: 4 mg via INTRAVENOUS
  Filled 2024-02-25: qty 2

## 2024-02-25 MED ORDER — PANTOPRAZOLE SODIUM 20 MG PO TBEC: 20.0000 mg | DELAYED_RELEASE_TABLET | Freq: Every day | ORAL | 0 refills | Status: DC

## 2024-02-25 MED ORDER — DICYCLOMINE HCL 20 MG PO TABS: 20.0000 mg | ORAL_TABLET | Freq: Two times a day (BID) | ORAL | 0 refills | Status: AC

## 2024-02-25 MED ORDER — SODIUM CHLORIDE 0.9 % IV BOLUS
1000.0000 mL | Freq: Once | INTRAVENOUS | Status: AC
  Administered 2024-02-25: 1000 mL via INTRAVENOUS

## 2024-02-25 NOTE — ED Notes (Signed)
 Reviewed AVS/discharge instruction with patient. Time allotted for and all questions answered. Patient is agreeable for d/c and escorted to ed exit by staff.

## 2024-02-25 NOTE — ED Notes (Signed)
 Asked patient to removed her shirt and bra and put on gown. Agrees with all except she wants to leave her bra on. She reports will take off for imaging.

## 2024-02-25 NOTE — Discharge Instructions (Signed)
 Please follow-up closely with your primary care doctor on an outpatient basis.  Return to emergency department immediately for any new or worsening symptoms.

## 2024-02-25 NOTE — ED Triage Notes (Signed)
 States had one episode of hematemesis last night w/ lingering CP and ABD pain. Denies blood in stool.

## 2024-02-25 NOTE — ED Provider Notes (Addendum)
 [place] EMERGENCY DEPARTMENT AT [facility] Provider Note   CSN: [id number] Arrival date & time: 02/25/24  1043     Patient presents with: Chest Pain and Hematemesis   Carol Paul is a 28 y.o. female.   Patient is a 28 year old female who presents to the emergency department with a chief complaint of epigastric abdominal pain, chest pain which has been ongoing for approximate the past 2 days as well as associated nausea and vomiting which has been ongoing for approximate the past week.  She does note that last night and early this morning she did have a bout of coffee-ground emesis.  She denies any associated melena or hematochezia.  She has had no dysuria or hematuria.  She does note that she has had a previous upper endoscopy in the past which did demonstrate gastritis.  She denies any associated dizziness, lightheadedness or syncope.  She does admit to some associated fever and chills though these have resolved.   Chest Pain Associated symptoms: abdominal pain, nausea and vomiting        Prior to Admission medications   Medication Sig Start Date End Date Taking? Authorizing Provider  traZODone  (DESYREL ) 100 MG tablet Take 200 mg by mouth at bedtime as needed. 02/17/24  Yes [provider]  albuterol  (PROVENTIL ) (2.5 MG/3ML) 0.083% nebulizer solution Take 3 mLs (2.5 mg total) by nebulization every 6 (six) hours as needed for wheezing or shortness of breath. 01/15/23   Lola Donnice HERO, MD  albuterol  (VENTOLIN  HFA) 108 [phone] Base) MCG/ACT inhaler Inhale 1-2 puffs into the lungs every 4 (four) hours as needed for wheezing or shortness of breath. 01/15/23   Lola Donnice HERO, MD  ALPRAZolam  (NIRAVAM ) 0.5 MG dissolvable tablet Take by mouth.    [provider]  alprazolam  (XANAX ) 2 MG tablet Take 1 mg by mouth every 8 (eight) hours as needed for anxiety.    [provider]  amphetamine-dextroamphetamine (ADDERALL) 20 MG tablet Take 1 tablet by  mouth. 05/23/22   [provider]  buPROPion  (WELLBUTRIN  XL) 300 MG 24 hr tablet Take 300 mg by mouth daily.    [provider]  cloNIDine  (CATAPRES ) 0.1 MG tablet Take 0.1 mg by mouth at bedtime.    [provider]  diphenhydrAMINE  (BENADRYL ) 25 MG tablet Take 50 mg by mouth daily as needed (following anaphylactic reaction).    [provider]  EPINEPHrine  0.3 mg/0.3 mL IJ SOAJ injection Inject 0.3 mg into the muscle as needed for anaphylaxis. 04/28/23   Davis, Jonathon H, MD  famotidine  (PEPCID ) 20 MG tablet Take 20 mg by mouth 2 (two) times daily.    [provider]  fluticasone  (FLONASE ) 50 MCG/ACT nasal spray Place 2 sprays into the nose. 08/18/23 08/17/24  [provider]  hydrOXYzine  (ATARAX ) 25 MG tablet Take 25 mg by mouth 3 (three) times daily as needed for anxiety.    [provider]  levothyroxine  (SYNTHROID ) 25 MCG tablet Take 25-50 mcg by mouth See admin instructions. Take 25 mcg (1 tablet) once daily before breakfast, except take 50 mcg (2 tablets) before breakfast on Monday and Friday.    [provider]  loratadine  (CLARITIN ) 10 MG tablet Take 1 tablet (10 mg total) by mouth daily. 11/25/23   Danford, Lonni SQUIBB, MD  magnesium  oxide (MAG-OX) 400 MG tablet Take 1 tablet by mouth at bedtime. 08/14/23   [provider]  metFORMIN  (GLUCOPHAGE ) 500 MG tablet Take 500 mg by mouth 2 (two) times daily.  10/09/23   [provider]  NIFEdipine (PROCARDIA XL/NIFEDICAL XL) 60 MG 24 hr tablet Take 60 mg by mouth. 09/12/23 09/11/24  [provider]  NURTEC 75 MG TBDP Take 75 mg by mouth. 10/02/23 10/01/24  [provider]  ondansetron  (ZOFRAN ) 4 MG tablet Take 4 mg by mouth every 8 (eight) hours as needed for nausea or vomiting.    [provider]  ondansetron  (ZOFRAN -ODT) 4 MG disintegrating tablet Take 1 tablet (4 mg total) by mouth every 8 (eight) hours as needed for nausea or vomiting.  03/20/23   Emilio Delilah HERO, CNM  promethazine  (PHENERGAN ) 25 MG suppository Place 1 suppository (25 mg total) rectally every 6 (six) hours as needed for nausea or vomiting. 01/15/23   Lola Donnice HERO, MD  promethazine  (PHENERGAN ) 25 MG tablet Take 1 tablet (25 mg total) by mouth every 6 (six) hours as needed for nausea or vomiting. 01/15/23   Lola Donnice HERO, MD  SPIRIVA RESPIMAT 1.25 MCG/ACT AERS SMARTSIG:2 inhalation Via Inhaler Daily    [provider]  SYMBICORT 160-4.5 MCG/ACT inhaler Inhale 2 puffs into the lungs 2 (two) times daily as needed. 09/12/22   [provider]  tiZANidine  (ZANAFLEX ) 4 MG tablet Take 4 mg by mouth 2 (two) times daily as needed.    [provider]    Allergies: Peanut oil, Sesame oil, Sesame seed (diagnostic), Shellfish allergy, Soy allergy (obsolete), Soybean oil, Silicone, Egg-derived products, Milk (cow), Reglan  [metoclopramide ], Wheat, and Prochlorperazine     Review of Systems  Cardiovascular:  Positive for chest pain.  Gastrointestinal:  Positive for abdominal pain, nausea and vomiting.  All other systems reviewed and are negative.   Updated Vital Signs BP 129/80 (BP Location: Right Arm)   Pulse (!) 109   Temp 98.6 F (37 C) (Oral)   Resp 18   SpO2 100%   Physical Exam Vitals and nursing note reviewed.  Constitutional:      Appearance: Normal appearance.  HENT:     Head: Normocephalic and atraumatic.     Nose: Nose normal.     Mouth/Throat:     Mouth: Mucous membranes are moist.  Eyes:     Extraocular Movements: Extraocular movements intact.     Conjunctiva/sclera: Conjunctivae normal.     Pupils: Pupils are equal, round, and reactive to light.  Cardiovascular:     Rate and Rhythm: Normal rate and regular rhythm.     Pulses: Normal pulses.     Heart sounds: Normal heart sounds. Heart sounds not distant. No murmur heard. Pulmonary:     Effort: Pulmonary effort is normal. No tachypnea or respiratory distress.      Breath sounds: Normal breath sounds. No stridor. No decreased breath sounds, wheezing, rhonchi or rales.  Chest:     Chest wall: No tenderness.  Abdominal:     General: Abdomen is flat. Bowel sounds are normal.     Palpations: Abdomen is soft.     Tenderness: There is no guarding.     Comments: Tenderness palpation to epigastric region  Musculoskeletal:        General: Normal range of motion.     Cervical back: Normal range of motion and neck supple.  Skin:    General: Skin is warm and dry.     Findings: No ecchymosis or rash.  Neurological:     General: No focal deficit present.     Mental Status: She is alert and oriented to person, place, and time. Mental status is at  baseline.  Psychiatric:        Mood and Affect: Mood normal.        Behavior: Behavior normal.        Thought Content: Thought content normal.        Judgment: Judgment normal.     (all labs ordered are listed, but only abnormal results are displayed) Labs Reviewed  COMPREHENSIVE METABOLIC PANEL WITH GFR  HCG, SERUM, QUALITATIVE  LIPASE, BLOOD  CBC WITH DIFFERENTIAL/PLATELET  TROPONIN T, HIGH SENSITIVITY    EKG: EKG Interpretation Date/Time:  Wednesday February 25 2024 10:51:23 EDT Ventricular Rate:  109 PR Interval:  125 QRS Duration:  75 QT Interval:  332 QTC Calculation: 447 R Axis:   20  Text Interpretation: Sinus tachycardia Borderline repolarization abnormality Confirmed by Ula Barter [phone]) on 02/25/2024 10:57:56 AM  Radiology: No results found.   Procedures   Medications Ordered in the ED  pantoprazole  (PROTONIX ) injection 40 mg (has no administration in time range)  alum & mag hydroxide-simeth (MAALOX/MYLANTA) 200-200-20 MG/5ML suspension 30 mL (has no administration in time range)    And  lidocaine  (XYLOCAINE ) 2 % viscous mouth solution 15 mL (has no administration in time range)  sodium chloride  0.9 % bolus 1,000 mL (has no administration in time range)  ondansetron  (ZOFRAN )  injection 4 mg (has no administration in time range)                                    Medical Decision Making Amount and/or Complexity of Data Reviewed Labs: ordered. Radiology: ordered.  Risk OTC drugs. Prescription drug management.   This patient presents to the ED for concern of chest pain, abdominal pain, nausea, vomiting, diarrhea differential diagnosis includes acute appendicitis, cholecystitis, bowel obstruction, diverticulitis, ovarian torsion or cyst, PID, tumor and abscess [place] nephritis, kidney stone, ACS, pulmonary embolus, pericarditis, myocarditis, endocarditis, pneumonia, pneumothorax, hemothorax, aortic aneurysm or dissection    Additional history obtained:  Additional history obtained from none External records from outside source obtained and reviewed including none   Lab Tests:  I Ordered, and personally interpreted labs.  The pertinent results include: No leukocytosis, no anemia, normal kidney function liver function, mild hypokalemia, negative lipase, negative serial troponins   Imaging Studies ordered:  I ordered imaging studies including chest x-ray, CT abdomen pelvis I independently visualized and interpreted imaging which showed no acute cardiopulmonary process, no acute intra-abdominal surgical process I agree with the radiologist interpretation   Medicines ordered and prescription drug management:  I ordered medication including potassium, GI cocktail, Zofran , Protonix , IV fluids for dehydration, nausea and vomiting Reevaluation of the patient after these medicines showed that the patient improved I have reviewed the patients home medicines and have made adjustments as needed   Problem List / ED Course:  Patient is doing very well at this time and is stable for discharge home.  Patient notes that symptoms have resolved with treatment in the emergency department.  Discussed with patient we will continue symptomatic treatment outpatient  basis and recommend close follow-up with her primary care doctor.  Patient had no acute surgical process noted on CT scan of abdomen and pelvis and chest x-ray was unremarkable.  She had negative troponin and EKG within normal limits and do not suspect ACS at this point.  Low suspicion for pulmonary embolus in this patient.  Tachycardia has resolved with treatment in the emergency department.  There has been no  associated hemoptysis, lower extremity pain or edema, recent long travel or surgeries.  She has had no associated pleuritic nature of her pain.  Strict trauma cautions were provided for any new or worsening symptoms.  Patient voiced understanding to the plan and had no additional questions.  Suspect that the mild blood that she has noted in her emesis is secondary to possible Mallory-Weiss tear or from retching.  Do not specked that admission or emergent GI consult is warranted at this time.  GI information was placed in paperwork.   Social Determinants of Health:  None        Final diagnoses:  None    ED Discharge Orders     None          Daralene Lonni BIRCH, PA-C 02/25/24 1231    Daralene Lonni BIRCH, PA-C 02/25/24 1240    Ula Prentice SAUNDERS, MD 02/25/24 [phone]

## 2024-03-29 ENCOUNTER — Emergency Department (HOSPITAL_BASED_OUTPATIENT_CLINIC_OR_DEPARTMENT_OTHER)
Admission: EM | Admit: 2024-03-29 | Discharge: 2024-03-29 | Disposition: A | Discharge status: Home / Self Care | Attending: Emergency Medicine | Admitting: Emergency Medicine

## 2024-03-29 ENCOUNTER — Other Ambulatory Visit: Payer: Self-pay

## 2024-03-29 ENCOUNTER — Encounter (HOSPITAL_BASED_OUTPATIENT_CLINIC_OR_DEPARTMENT_OTHER): Payer: Self-pay

## 2024-03-29 DIAGNOSIS — I10 Essential (primary) hypertension: Secondary | ICD-10-CM | POA: Insufficient documentation

## 2024-03-29 DIAGNOSIS — H00014 Hordeolum externum left upper eyelid: Secondary | ICD-10-CM | POA: Diagnosis not present

## 2024-03-29 DIAGNOSIS — E119 Type 2 diabetes mellitus without complications: Secondary | ICD-10-CM | POA: Diagnosis not present

## 2024-03-29 DIAGNOSIS — H5712 Ocular pain, left eye: Secondary | ICD-10-CM | POA: Diagnosis present

## 2024-03-29 DIAGNOSIS — J45909 Unspecified asthma, uncomplicated: Secondary | ICD-10-CM | POA: Diagnosis not present

## 2024-03-29 NOTE — ED Triage Notes (Signed)
 Arrives ambulatory to the ED with complaints of left eye pain and drainage x2 days.

## 2024-03-29 NOTE — ED Notes (Signed)
 Patient given discharge instructions. Questions were answered. Patient verbalized understanding of discharge instructions and care at home.

## 2024-03-29 NOTE — ED Provider Notes (Signed)
 [place] EMERGENCY DEPARTMENT AT [facility] Provider Note  CSN: [id number] Arrival date & time: 03/29/24 1207  Chief Complaint(s) Eye Drainage (Left)  HPI Carol Paul is a 28 y.o. female here for left eye pain and swelling for 2 days.  She noted small amount of discharge in the eye this morning.  Denies any redness.  No trauma.  Patient does wear contacts.  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   Prediabetes 01/15/2023   Acute asthma exacerbation 12/02/2022   History of postpartum hemorrhage 09/12/2022   Vaginal bleeding during pregnancy 08/30/2022   Anemia associated with acute blood loss 08/29/2022   Short cervix during pregnancy in second trimester 08/29/2022   Vaginal bleeding in pregnancy, second trimester 08/24/2022   Anxiety 08/21/2022   [redacted] weeks gestation of pregnancy 08/21/2022   Eating disorder in remission 07/12/2022   Attention-deficit hyperactivity disorder, 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 Authorizing Provider  albuterol  (PROVENTIL ) (2.5 MG/3ML) 0.083% nebulizer solution Take 3 mLs (2.5 mg total) by nebulization every 6 (six) hours as needed for wheezing or shortness of breath. 01/15/23   Lola Donnice HERO, MD  albuterol  (VENTOLIN  HFA) 108 [phone] Base) MCG/ACT inhaler Inhale 1-2 puffs into the lungs every 4  (four) hours as needed for wheezing or shortness of breath. 01/15/23   Lola Donnice HERO, MD  ALPRAZolam  (NIRAVAM ) 0.5 MG dissolvable tablet Take by mouth.    [provider]  alprazolam  (XANAX ) 2 MG tablet Take 1 mg by mouth every 8 (eight) hours as needed for anxiety.    [provider]  amphetamine-dextroamphetamine (ADDERALL) 20 MG tablet Take 1 tablet by mouth. 05/23/22   [provider]  buPROPion  (WELLBUTRIN  XL) 300 MG 24 hr tablet Take 300 mg by mouth daily.    [provider]  cloNIDine  (CATAPRES ) 0.1 MG tablet Take 0.1 mg by mouth at bedtime.    [provider]  dicyclomine  (BENTYL ) 20 MG tablet Take 1 tablet (20 mg total) by mouth 2 (two) times daily. 02/25/24   Daralene Lonni BIRCH, PA-C  diphenhydrAMINE  (BENADRYL ) 25 MG tablet Take 50 mg by mouth daily as needed (following anaphylactic reaction).    [provider]  EPINEPHrine  0.3 mg/0.3 mL IJ SOAJ injection Inject 0.3 mg into the muscle as needed for anaphylaxis. 04/28/23   Nicholaus Cassondra DEL, MD  famotidine  (PEPCID ) 20 MG tablet Take 20 mg by mouth 2 (two) times daily.    [provider]  fluticasone  (FLONASE ) 50 MCG/ACT nasal spray Place 2 sprays into the nose. 08/18/23 08/17/24  [provider]  hydrOXYzine  (ATARAX ) 25 MG tablet Take 25 mg by mouth 3 (three) times daily as needed for anxiety.    [provider]  levothyroxine  (SYNTHROID ) 25 MCG tablet Take 25-50 mcg by mouth See admin instructions. Take 25 mcg (1 tablet)  once daily before breakfast, except take 50 mcg (2 tablets) before breakfast on Monday and Friday.    [provider]  loratadine  (CLARITIN ) 10 MG tablet Take 1 tablet (10 mg total) by mouth daily. 11/25/23   Danford, Lonni SQUIBB, MD  magnesium  oxide (MAG-OX) 400 MG tablet Take 1 tablet by mouth at bedtime. 08/14/23   [provider]  metFORMIN  (GLUCOPHAGE ) 500 MG tablet Take 500 mg by mouth 2 (two) times daily. 10/09/23    [provider]  NIFEdipine (PROCARDIA XL/NIFEDICAL XL) 60 MG 24 hr tablet Take 60 mg by mouth. 09/12/23 09/11/24  [provider]  NURTEC 75 MG TBDP Take 75 mg by mouth. 10/02/23 10/01/24  [provider]  ondansetron  (ZOFRAN ) 4 MG tablet Take 4 mg by mouth every 8 (eight) hours as needed for nausea or vomiting.    [provider]  ondansetron  (ZOFRAN -ODT) 4 MG disintegrating tablet Take 1 tablet (4 mg total) by mouth every 8 (eight) hours as needed for nausea or vomiting. 02/25/24   Daralene Lonni BIRCH, PA-C  pantoprazole  (PROTONIX ) 20 MG tablet Take 1 tablet (20 mg total) by mouth daily. 02/25/24   Daralene Lonni BIRCH, PA-C  promethazine  (PHENERGAN ) 25 MG suppository Place 1 suppository (25 mg total) rectally every 6 (six) hours as needed for nausea or vomiting. 01/15/23   Lola Donnice HERO, MD  promethazine  (PHENERGAN ) 25 MG tablet Take 1 tablet (25 mg total) by mouth every 6 (six) hours as needed for nausea or vomiting. 01/15/23   Lola Donnice HERO, MD  SPIRIVA RESPIMAT 1.25 MCG/ACT AERS SMARTSIG:2 inhalation Via Inhaler Daily    [provider]  SYMBICORT 160-4.5 MCG/ACT inhaler Inhale 2 puffs into the lungs 2 (two) times daily as needed. 09/12/22   [provider]  tiZANidine  (ZANAFLEX ) 4 MG tablet Take 4 mg by mouth 2 (two) times daily as needed.    [provider]  traZODone  (DESYREL ) 100 MG tablet Take 200 mg by mouth at bedtime as needed. 02/17/24   [provider]                                                                                                                                    Allergies Peanut oil, Sesame oil, Sesame seed (diagnostic), Shellfish allergy, Soy allergy (obsolete), Soybean oil, Silicone, Egg-derived products, Milk (cow), Reglan  [metoclopramide ], Wheat, and Prochlorperazine   Review of Systems Review of Systems As noted in HPI  Physical Exam Vital Signs  I have reviewed the triage vital  signs BP 116/87 (BP Location: Right Arm)   Pulse 94   Temp 98.1 F (36.7 C) (Oral)   Resp 20   Ht 5' 2 (1.575 m)   Wt 77.1 kg   SpO2 99%   BMI 31.09 kg/m   Physical Exam Vitals reviewed.  Constitutional:      General: She is not in acute distress.    Appearance: She is well-developed.  She is not diaphoretic.  HENT:     Head: Normocephalic and atraumatic.     Right Ear: External ear normal.     Left Ear: External ear normal.     Nose: Nose normal.  Eyes:     General: No scleral icterus.       Right eye: No discharge.        Left eye: Hordeolum (internal, lateral) present.No discharge.     Conjunctiva/sclera: Conjunctivae normal.     Right eye: Right conjunctiva is not injected. No exudate or hemorrhage.    Left eye: Left conjunctiva is not injected. No exudate or hemorrhage. Neck:     Trachea: Phonation normal.  Cardiovascular:     Rate and Rhythm: Normal rate and regular rhythm.  Pulmonary:     Effort: Pulmonary effort is normal. No respiratory distress.     Breath sounds: No stridor.  Abdominal:     General: There is no distension.  Musculoskeletal:        General: Normal range of motion.     Cervical back: Normal range of motion.  Neurological:     Mental Status: She is alert and oriented to person, place, and time.  Psychiatric:        Behavior: Behavior normal.     ED Results and Treatments Labs (all labs ordered are listed, but only abnormal results are displayed) Labs Reviewed - No data to display                                                                                                                       EKG  EKG Interpretation Date/Time:    Ventricular Rate:    PR Interval:    QRS Duration:    QT Interval:    QTC Calculation:   R Axis:      Text Interpretation:         Radiology No results found.  Medications Ordered in ED Medications - No data to display Procedures Procedures  (including critical care time) Medical Decision  Making / ED Course   Medical Decision Making   Left eye pain and swelling.  Exam is consistent for an internal hordeolum.  No signs of superimposed infection.  Recommended vacation from contacts until healed.  Supportive management recommended with warm compresses.  No need for antibiotics at this time    Final Clinical Impression(s) / ED Diagnoses Final diagnoses:  Hordeolum externum of left upper eyelid   The patient appears reasonably screened and/or stabilized for discharge and I doubt any other medical condition or other [facility] requiring further screening, evaluation, or treatment in the ED at this time. I have discussed the findings, Dx and Tx plan with the patient/family who expressed understanding and agree(s) with the plan. Discharge instructions discussed at length. The patient/family was given strict return precautions who verbalized understanding of the instructions. No further questions at time of discharge.  Disposition: Discharge  Condition: Good  ED Discharge Orders     None  Follow Up: Corbin Mabel NOVAK, MD [address] [place] [address] [phone]  Call  as needed    This chart was dictated using voice recognition software.  Despite best efforts to proofread,  errors can occur which can change the documentation meaning.    Trine Raynell Moder, MD 03/29/24 1335

## 2024-05-02 ENCOUNTER — Other Ambulatory Visit: Payer: Self-pay

## 2024-05-02 ENCOUNTER — Emergency Department (HOSPITAL_BASED_OUTPATIENT_CLINIC_OR_DEPARTMENT_OTHER)
Admission: EM | Admit: 2024-05-02 | Discharge: 2024-05-02 | Disposition: A | Discharge status: Home / Self Care | Attending: Emergency Medicine | Admitting: Emergency Medicine

## 2024-05-02 DIAGNOSIS — Z91010 Allergy to peanuts: Secondary | ICD-10-CM | POA: Diagnosis not present

## 2024-05-02 DIAGNOSIS — R059 Cough, unspecified: Secondary | ICD-10-CM | POA: Diagnosis present

## 2024-05-02 DIAGNOSIS — J069 Acute upper respiratory infection, unspecified: Secondary | ICD-10-CM | POA: Insufficient documentation

## 2024-05-02 DIAGNOSIS — Z79899 Other long term (current) drug therapy: Secondary | ICD-10-CM | POA: Insufficient documentation

## 2024-05-02 DIAGNOSIS — J45901 Unspecified asthma with (acute) exacerbation: Secondary | ICD-10-CM | POA: Insufficient documentation

## 2024-05-02 LAB — RESP PANEL BY RT-PCR (RSV, FLU A&B, COVID)  RVPGX2
Influenza A by PCR: NEGATIVE
Influenza B by PCR: NEGATIVE
Resp Syncytial Virus by PCR: NEGATIVE
SARS Coronavirus 2 by RT PCR: NEGATIVE

## 2024-05-02 MED ORDER — IPRATROPIUM BROMIDE 0.02 % IN SOLN
0.5000 mg | Freq: Once | RESPIRATORY_TRACT | Status: AC
  Administered 2024-05-02: 0.5 mg via RESPIRATORY_TRACT
  Filled 2024-05-02: qty 2.5

## 2024-05-02 MED ORDER — PREDNISONE 20 MG PO TABS: 40.0000 mg | ORAL_TABLET | Freq: Every day | ORAL | 0 refills | Status: AC

## 2024-05-02 MED ORDER — IPRATROPIUM-ALBUTEROL 0.5-2.5 (3) MG/3ML IN SOLN: 6.0000 mL | Freq: Once | RESPIRATORY_TRACT | Status: AC

## 2024-05-02 MED ORDER — ALBUTEROL SULFATE (5 MG/ML) 0.5% IN NEBU: 2.5000 mg | INHALATION_SOLUTION | Freq: Four times a day (QID) | RESPIRATORY_TRACT | 12 refills | Status: AC | PRN

## 2024-05-02 MED ORDER — ALBUTEROL SULFATE (2.5 MG/3ML) 0.083% IN NEBU
5.0000 mg | INHALATION_SOLUTION | Freq: Once | RESPIRATORY_TRACT | Status: AC
  Administered 2024-05-02: 5 mg via RESPIRATORY_TRACT
  Filled 2024-05-02: qty 6

## 2024-05-02 MED ORDER — IPRATROPIUM-ALBUTEROL 0.5-2.5 (3) MG/3ML IN SOLN
RESPIRATORY_TRACT | Status: AC
  Administered 2024-05-02: 6 mL via RESPIRATORY_TRACT
  Filled 2024-05-02: qty 6

## 2024-05-02 MED ORDER — ALBUTEROL SULFATE HFA 108 (90 BASE) MCG/ACT IN AERS
2.0000 | INHALATION_SPRAY | RESPIRATORY_TRACT | Status: DC | PRN
  Filled 2024-05-02: qty 6.7

## 2024-05-02 NOTE — ED Triage Notes (Signed)
 C/o cough, congestion and wheezing x 3 days.  Denies fevers.

## 2024-05-02 NOTE — ED Provider Notes (Signed)
 [place] EMERGENCY DEPARTMENT AT [facility] Provider Note   CSN: [id number] Arrival date & time: 05/02/24  9081     Patient presents with: URI   Carol Paul is a 28 y.o. female.   Patient with history of asthma presents to the emergency department for several days of URI symptoms.  She reports nasal congestion, runny nose, cough.  She has had fevers at home.  She reports increasing wheezing.  She does not currently have nebulizer solution.  No vomiting or diarrhea.  Patient has small child with her, states that she is not currently breast-feeding.       Prior to Admission medications   Medication Sig Start Date End Date Taking? Authorizing Provider  albuterol  (PROVENTIL ) (2.5 MG/3ML) 0.083% nebulizer solution Take 3 mLs (2.5 mg total) by nebulization every 6 (six) hours as needed for wheezing or shortness of breath. 01/15/23   Lola Donnice HERO, MD  albuterol  (VENTOLIN  HFA) 108 (90 Base) MCG/ACT inhaler Inhale 1-2 puffs into the lungs every 4 (four) hours as needed for wheezing or shortness of breath. 01/15/23   Lola Donnice HERO, MD  ALPRAZolam  (NIRAVAM ) 0.5 MG dissolvable tablet Take by mouth.    [provider]  alprazolam  (XANAX ) 2 MG tablet Take 1 mg by mouth every 8 (eight) hours as needed for anxiety.    [provider]  amphetamine-dextroamphetamine (ADDERALL) 20 MG tablet Take 1 tablet by mouth. 05/23/22   [provider]  buPROPion  (WELLBUTRIN  XL) 300 MG 24 hr tablet Take 300 mg by mouth daily.    [provider]  cloNIDine  (CATAPRES ) 0.1 MG tablet Take 0.1 mg by mouth at bedtime.    [provider]  dicyclomine  (BENTYL ) 20 MG tablet Take 1 tablet (20 mg total) by mouth 2 (two) times daily. 02/25/24   Daralene Lonni BIRCH, PA-C  diphenhydrAMINE  (BENADRYL ) 25 MG tablet Take 50 mg by mouth daily as needed (following anaphylactic reaction).    [provider]  EPINEPHrine  0.3 mg/0.3 mL IJ SOAJ injection  Inject 0.3 mg into the muscle as needed for anaphylaxis. 04/28/23   Davis, Jonathon H, MD  famotidine  (PEPCID ) 20 MG tablet Take 20 mg by mouth 2 (two) times daily.    [provider]  fluticasone  (FLONASE ) 50 MCG/ACT nasal spray Place 2 sprays into the nose. 08/18/23 08/17/24  [provider]  hydrOXYzine  (ATARAX ) 25 MG tablet Take 25 mg by mouth 3 (three) times daily as needed for anxiety.    [provider]  levothyroxine  (SYNTHROID ) 25 MCG tablet Take 25-50 mcg by mouth See admin instructions. Take 25 mcg (1 tablet) once daily before breakfast, except take 50 mcg (2 tablets) before breakfast on Monday and Friday.    [provider]  loratadine  (CLARITIN ) 10 MG tablet Take 1 tablet (10 mg total) by mouth daily. 11/25/23   Danford, Lonni SQUIBB, MD  magnesium  oxide (MAG-OX) 400 MG tablet Take 1 tablet by mouth at bedtime. 08/14/23   [provider]  metFORMIN  (GLUCOPHAGE ) 500 MG tablet Take 500 mg by mouth 2 (two) times daily. 10/09/23   [provider]  NIFEdipine (PROCARDIA XL/NIFEDICAL XL) 60 MG 24 hr tablet Take 60 mg by mouth. 09/12/23 09/11/24  [provider]  NURTEC 75 MG TBDP Take 75 mg by mouth. 10/02/23 10/01/24  [provider]  ondansetron  (ZOFRAN ) 4 MG tablet Take 4 mg by mouth every 8 (eight) hours as needed for nausea or vomiting.    [provider]  ondansetron  (  ZOFRAN -ODT) 4 MG disintegrating tablet Take 1 tablet (4 mg total) by mouth every 8 (eight) hours as needed for nausea or vomiting. 02/25/24   Daralene Lonni BIRCH, PA-C  pantoprazole  (PROTONIX ) 20 MG tablet Take 1 tablet (20 mg total) by mouth daily. 02/25/24   Daralene Lonni BIRCH, PA-C  promethazine  (PHENERGAN ) 25 MG suppository Place 1 suppository (25 mg total) rectally every 6 (six) hours as needed for nausea or vomiting. 01/15/23   Lola Donnice HERO, MD  promethazine  (PHENERGAN ) 25 MG tablet Take 1 tablet (25 mg total) by mouth every 6 (six) hours as  needed for nausea or vomiting. 01/15/23   Lola Donnice HERO, MD  SPIRIVA RESPIMAT 1.25 MCG/ACT AERS SMARTSIG:2 inhalation Via Inhaler Daily    [provider]  SYMBICORT 160-4.5 MCG/ACT inhaler Inhale 2 puffs into the lungs 2 (two) times daily as needed. 09/12/22   [provider]  tiZANidine  (ZANAFLEX ) 4 MG tablet Take 4 mg by mouth 2 (two) times daily as needed.    [provider]  traZODone  (DESYREL ) 100 MG tablet Take 200 mg by mouth at bedtime as needed. 02/17/24   [provider]    Allergies: Peanut oil, Sesame oil, Sesame seed (diagnostic), Shellfish allergy, Soy allergy (obsolete), Soybean oil, Silicone, Egg protein-containing drug products, Milk (cow), Reglan  [metoclopramide ], Wheat, and Prochlorperazine     Review of Systems  Updated Vital Signs BP 116/68 (BP Location: Right Arm)   Pulse 88   Temp 97.8 F (36.6 C) (Oral)   Resp 18   SpO2 99%   Physical Exam Vitals and nursing note reviewed.  Constitutional:      Appearance: She is well-developed.  HENT:     Head: Normocephalic and atraumatic.     Jaw: No trismus.     Right Ear: Tympanic membrane, ear canal and external ear normal.     Left Ear: Tympanic membrane, ear canal and external ear normal.     Nose: Congestion present. No mucosal edema or rhinorrhea.     Mouth/Throat:     Mouth: Mucous membranes are moist. Mucous membranes are not dry. No oral lesions.     Pharynx: Uvula midline. No oropharyngeal exudate, posterior oropharyngeal erythema or uvula swelling.     Tonsils: No tonsillar abscesses.  Eyes:     General:        Right eye: No discharge.        Left eye: No discharge.     Conjunctiva/sclera: Conjunctivae normal.  Cardiovascular:     Rate and Rhythm: Normal rate and regular rhythm.     Heart sounds: Normal heart sounds.  Pulmonary:     Effort: Pulmonary effort is normal. No respiratory distress.     Breath sounds: Normal breath sounds. No wheezing or rales.      Comments: On breathing treatment during my exam, no increased work of breathing.  End expiratory wheezing, scattered.  She is talking in full sentences. Abdominal:     Palpations: Abdomen is soft.     Tenderness: There is no abdominal tenderness.  Musculoskeletal:     Cervical back: Normal range of motion and neck supple.  Lymphadenopathy:     Cervical: No cervical adenopathy.  Skin:    General: Skin is warm and dry.  Neurological:     Mental Status: She is alert.  Psychiatric:        Mood and Affect: Mood normal.     (all labs ordered are listed, but only abnormal results are displayed) Labs Reviewed  RESP PANEL BY RT-PCR (RSV, FLU A&B, COVID)  [id number]   ED Course  Patient seen and examined. History obtained directly from patient.   Labs/EKG: Ordered respiratory panel  Imaging: None ordered  Medications/Fluids: Ordered: Albuterol , discussed prednisone , will plan to give prescription for home.  Most recent vital signs reviewed and are as follows: BP 116/68 (BP Location: Right Arm)   Pulse 88   Temp 97.8 F (36.6 C) (Oral)   Resp 18   SpO2 99%   Initial impression: URI with asthma exacerbation  10:54 AM Reassessment performed. Patient appears stable, still with some chest tightness.  Requests another breathing treatment.  Reviewed pertinent lab work and imaging with patient at bedside. Questions answered.   Most current vital signs reviewed and are as follows: BP 113/83   Pulse (!) 130   Temp 97.8 F (36.6 C) (Oral)   Resp 18   SpO2 94%   Plan: Reassess after additional medication.  11:55 AM Reassessment performed. Patient appears improved. Standing at bedside. On reassessment, minimal wheezing upper lung fields, otherwise clear.   Reviewed pertinent lab work and imaging with patient at bedside. Questions answered.   Most current vital signs reviewed and are as follows: BP (!) 130/49   Pulse (!) 124   Temp 97.8 F (36.6 C) (Oral)   Resp 18   SpO2 97%    Plan: Discharge to home.   Prescriptions written for: albuterol , prednisone .   Other home care instructions discussed: Albuterol  nebulizer every 4 hours for the rest of today, then as needed, prednisone  if needed.  ED return instructions discussed: Worsening shortness of breath, increased work of breathing, fever, new or worsening symptoms.  Follow-up instructions discussed: Patient encouraged to follow-up with their PCP in 3 days.    EKG: None  Radiology: No results found.   Procedures   Medications Ordered in the ED  ipratropium-albuterol  (DUONEB) 0.5-2.5 (3) MG/3ML nebulizer solution 6 mL (6 mLs Nebulization Given 05/02/24 9072)                                   Medical Decision Making Risk Prescription drug management.   Patient with URI symptoms and asthma exacerbation.  Patient was treated with multiple rounds of albuterol  with clinical improvement.  Patient wanted to hold off on prednisone  which she worsened at home.  Viral panel negative.  Mild wheezing at time of exam, improved from earlier.  No concern for pneumonia.  No fevers.  No hypoxia.  Patient able to continue treating her symptoms at home, just needs albuterol  nebulizer solution.  Also given albuterol  HFA.  The patient's vital signs, pertinent lab work and imaging were reviewed and interpreted as discussed in the ED course. Hospitalization was considered for further testing, treatments, or serial exams/observation. However as patient is well-appearing, has a stable exam, and reassuring studies today, I do not feel that they warrant admission at this time. This plan was discussed with the patient who verbalizes agreement and comfort with this plan and seems reliable and able to return to the Emergency Department with worsening or changing symptoms.        Final diagnoses:  Upper respiratory tract infection, unspecified type  Exacerbation of asthma, unspecified asthma severity, unspecified whether  persistent    ED Discharge Orders          Ordered    albuterol  (PROVENTIL ) (5 MG/ML) 0.5% nebulizer solution  Every 6 hours PRN  05/02/24 1154    predniSONE  (DELTASONE ) 20 MG tablet  Daily        05/02/24 1154               Desiderio Chew, PA-C 05/02/24 1158    Geroldine Berg, MD 05/04/24 [phone]

## 2024-05-02 NOTE — Discharge Instructions (Signed)
 Please read and follow all provided instructions.  Your diagnoses today include:  1. Upper respiratory tract infection, unspecified type   2. Exacerbation of asthma, unspecified asthma severity, unspecified whether persistent     You appear to have an upper respiratory infection (URI). An upper respiratory tract infection, or cold, is a viral infection of the air passages leading to the lungs. It should improve gradually after 5-7 days. You may have a lingering cough that lasts for 2- 4 weeks after the infection.  Tests performed today include: Vital signs. See below for your results today.  Flu, covid, RSV:  Medications prescribed:  Albuterol  inhaler - medication that opens up your airway  Use inhaler as follows: 1-2 puffs with spacer every 4 hours as needed for wheezing, cough, or shortness of breath.   Prednisone  - steroid medicine   It is best to take this medication in the morning to prevent sleeping problems. If you are diabetic, monitor your blood sugar closely and stop taking Prednisone  if blood sugar is over 300. Take with food to prevent stomach upset.   Take any prescribed medications only as directed. Treatment for your infection is aimed at treating the symptoms. There are no medications, such as antibiotics, that will cure your infection.   Home care instructions:  You can take Tylenol  and/or Ibuprofen  as directed on the packaging for fever reduction and pain relief.    For cough: honey 1/2 to 1 teaspoon (you can dilute the honey in water  or another fluid).  You can also use guaifenesin  and dextromethorphan  for cough. You can use a humidifier for chest congestion and cough.  If you don't have a humidifier, you can sit in the bathroom with the hot shower running.      For sore throat: try warm salt water  gargles, cepacol lozenges, throat spray, warm tea or water  with lemon/honey, popsicles or ice, or OTC cold relief medicine for throat discomfort.    For congestion: take a  daily anti-histamine like Zyrtec, Claritin , and a oral decongestant, such as pseudoephedrine.  You can also use Flonase  1-2 sprays in each nostril daily.    It is important to stay hydrated: drink plenty of fluids (water , gatorade/powerade/pedialyte, juices, or teas) to keep your throat moisturized and help further relieve irritation/discomfort.   Your illness is contagious and can be spread to others, especially during the first 3 or 4 days. It cannot be cured by antibiotics or other medicines. Take basic precautions such as washing your hands often, covering your mouth when you cough or sneeze, and avoiding public places where you could spread your illness to others.   Please continue drinking plenty of fluids.  Use over-the-counter medicines as needed as directed on packaging for symptom relief.  You may also use ibuprofen  or tylenol  as directed on packaging for pain or fever.  Do not take multiple medicines containing Tylenol  or acetaminophen  to avoid taking too much of this medication.  Follow-up instructions: Please follow-up with your primary care provider in the next 3 days for further evaluation of your symptoms if you are not feeling better.   Return instructions:  Please return to the Emergency Department if you experience worsening symptoms.  RETURN IMMEDIATELY IF you develop shortness of breath, confusion or altered mental status, a new rash, become dizzy, faint, or poorly responsive, or are unable to be cared for at home. Please return if you have persistent vomiting and cannot keep down fluids or develop a fever that is not controlled by  tylenol  or motrin .   Please return if you have any other emergent concerns.  Additional Information:  Your vital signs today were: BP 116/68 (BP Location: Right Arm)   Pulse 88   Temp 97.8 F (36.6 C) (Oral)   Resp 18   SpO2 99%  If your blood pressure (BP) was elevated above 135/85 this visit, please have this repeated by your doctor within  one month. --------------

## 2024-07-13 ENCOUNTER — Emergency Department (HOSPITAL_BASED_OUTPATIENT_CLINIC_OR_DEPARTMENT_OTHER)
Admission: EM | Admit: 2024-07-13 | Discharge: 2024-07-14 | Discharge status: Against Medical Advice | Attending: Emergency Medicine | Admitting: Emergency Medicine

## 2024-07-13 ENCOUNTER — Encounter (HOSPITAL_BASED_OUTPATIENT_CLINIC_OR_DEPARTMENT_OTHER): Payer: Self-pay

## 2024-07-13 DIAGNOSIS — Z5321 Procedure and treatment not carried out due to patient leaving prior to being seen by health care provider: Secondary | ICD-10-CM | POA: Insufficient documentation

## 2024-07-13 DIAGNOSIS — R112 Nausea with vomiting, unspecified: Secondary | ICD-10-CM | POA: Diagnosis present

## 2024-07-13 LAB — COMPREHENSIVE METABOLIC PANEL WITH GFR
ALT: 9 U/L (ref 0–44)
AST: 24 U/L (ref 15–41)
Albumin: 5 g/dL (ref 3.5–5.0)
Alkaline Phosphatase: 70 U/L (ref 38–126)
Anion gap: 22 — ABNORMAL HIGH (ref 5–15)
BUN: 8 mg/dL (ref 6–20)
CO2: 15 mmol/L — ABNORMAL LOW (ref 22–32)
Calcium: 9.8 mg/dL (ref 8.9–10.3)
Chloride: 99 mmol/L (ref 98–111)
Creatinine, Ser: 0.7 mg/dL (ref 0.44–1.00)
GFR, Estimated: >60 mL/min
Glucose, Bld: 92 mg/dL (ref 70–99)
Potassium: 4.2 mmol/L (ref 3.5–5.1)
Sodium: 136 mmol/L (ref 135–145)
Total Bilirubin: 0.7 mg/dL (ref 0.0–1.2)
Total Protein: 8.5 g/dL — ABNORMAL HIGH (ref 6.5–8.1)

## 2024-07-13 LAB — CBC WITH DIFFERENTIAL/PLATELET
Abs Immature Granulocytes: 0.04 K/uL (ref 0.00–0.07)
Basophils Absolute: 0.1 K/uL (ref 0.0–0.1)
Basophils Relative: 0 %
Eosinophils Absolute: 0.1 K/uL (ref 0.0–0.5)
Eosinophils Relative: 1 %
HCT: 42.5 % (ref 36.0–46.0)
Hemoglobin: 14.8 g/dL (ref 12.0–15.0)
Immature Granulocytes: 0 %
Lymphocytes Relative: 26 %
Lymphs Abs: 3.7 K/uL (ref 0.7–4.0)
MCH: 32.3 pg (ref 26.0–34.0)
MCHC: 34.8 g/dL (ref 30.0–36.0)
MCV: 92.8 fL (ref 80.0–100.0)
Monocytes Absolute: 0.6 K/uL (ref 0.1–1.0)
Monocytes Relative: 5 %
Neutro Abs: 9.6 K/uL — ABNORMAL HIGH (ref 1.7–7.7)
Neutrophils Relative %: 68 %
Platelets: 271 K/uL (ref 150–400)
RBC: 4.58 MIL/uL (ref 3.87–5.11)
RDW: 12.4 % (ref 11.5–15.5)
WBC: 14.1 K/uL — ABNORMAL HIGH (ref 4.0–10.5)
nRBC: 0 % (ref 0.0–0.2)

## 2024-07-13 LAB — HCG, QUANTITATIVE, PREGNANCY: hCG, Beta Chain, Quant, S: 29 m[IU]/mL — ABNORMAL HIGH

## 2024-07-13 NOTE — ED Triage Notes (Signed)
 Pt c/o NV, advises she just found out she's pregnant. LMP approx 68mo ago. Advises hx HG w previous pregnancies, concerned for same.

## 2024-07-14 NOTE — ED Notes (Signed)
 Pt left prior to being called to treatment room.

## 2024-07-21 NOTE — Telephone Encounter (Signed)
 This pt lost her medication (Niravam ) and she wants to know if she can get a refill for her prescription or if she has to wait until the next visit to get a refill.

## 2024-07-21 NOTE — Telephone Encounter (Signed)
 Patient notified and voiced understanding.

## 2024-07-21 NOTE — Telephone Encounter (Signed)
 She's on a low dose, but they may make her wait before filling it New rx sent Orders Placed This Encounter  Medications   ALPRAZolam  (NIRAVAM ) 0.5 mg disintegrating tablet    Sig: Take 1 tab twice daily as needed for panic attacks    Dispense:  30 tablet    Refill:  5

## 2024-07-29 ENCOUNTER — Emergency Department (HOSPITAL_BASED_OUTPATIENT_CLINIC_OR_DEPARTMENT_OTHER)
Admission: EM | Admit: 2024-07-29 | Discharge: 2024-07-29 | Disposition: A | Discharge status: Home / Self Care | Attending: Emergency Medicine | Admitting: Emergency Medicine

## 2024-07-29 ENCOUNTER — Other Ambulatory Visit: Payer: Self-pay

## 2024-07-29 ENCOUNTER — Emergency Department (HOSPITAL_BASED_OUTPATIENT_CLINIC_OR_DEPARTMENT_OTHER)

## 2024-07-29 ENCOUNTER — Encounter (HOSPITAL_BASED_OUTPATIENT_CLINIC_OR_DEPARTMENT_OTHER): Payer: Self-pay | Admitting: Emergency Medicine

## 2024-07-29 DIAGNOSIS — R1031 Right lower quadrant pain: Secondary | ICD-10-CM | POA: Diagnosis not present

## 2024-07-29 DIAGNOSIS — R748 Abnormal levels of other serum enzymes: Secondary | ICD-10-CM | POA: Diagnosis not present

## 2024-07-29 DIAGNOSIS — I10 Essential (primary) hypertension: Secondary | ICD-10-CM | POA: Insufficient documentation

## 2024-07-29 DIAGNOSIS — O26891 Other specified pregnancy related conditions, first trimester: Secondary | ICD-10-CM | POA: Insufficient documentation

## 2024-07-29 DIAGNOSIS — D72829 Elevated white blood cell count, unspecified: Secondary | ICD-10-CM | POA: Insufficient documentation

## 2024-07-29 DIAGNOSIS — E119 Type 2 diabetes mellitus without complications: Secondary | ICD-10-CM | POA: Insufficient documentation

## 2024-07-29 DIAGNOSIS — Z3A01 Less than 8 weeks gestation of pregnancy: Secondary | ICD-10-CM | POA: Diagnosis not present

## 2024-07-29 DIAGNOSIS — Z79899 Other long term (current) drug therapy: Secondary | ICD-10-CM | POA: Insufficient documentation

## 2024-07-29 DIAGNOSIS — Z91010 Allergy to peanuts: Secondary | ICD-10-CM | POA: Diagnosis not present

## 2024-07-29 DIAGNOSIS — Z7951 Long term (current) use of inhaled steroids: Secondary | ICD-10-CM | POA: Insufficient documentation

## 2024-07-29 DIAGNOSIS — J45909 Unspecified asthma, uncomplicated: Secondary | ICD-10-CM | POA: Diagnosis not present

## 2024-07-29 DIAGNOSIS — R109 Unspecified abdominal pain: Secondary | ICD-10-CM

## 2024-07-29 LAB — LIPASE, BLOOD: Lipase: 71 U/L — ABNORMAL HIGH (ref 11–51)

## 2024-07-29 LAB — COMPREHENSIVE METABOLIC PANEL WITH GFR
ALT: 10 U/L (ref 0–44)
AST: 15 U/L (ref 15–41)
Albumin: 4.6 g/dL (ref 3.5–5.0)
Alkaline Phosphatase: 54 U/L (ref 38–126)
Anion gap: 13 (ref 5–15)
BUN: 14 mg/dL (ref 6–20)
CO2: 22 mmol/L (ref 22–32)
Calcium: 9.4 mg/dL (ref 8.9–10.3)
Chloride: 102 mmol/L (ref 98–111)
Creatinine, Ser: 0.57 mg/dL (ref 0.44–1.00)
GFR, Estimated: >60 mL/min
Glucose, Bld: 90 mg/dL (ref 70–99)
Potassium: 3.9 mmol/L (ref 3.5–5.1)
Sodium: 138 mmol/L (ref 135–145)
Total Bilirubin: 0.2 mg/dL (ref 0.0–1.2)
Total Protein: 7.2 g/dL (ref 6.5–8.1)

## 2024-07-29 LAB — URINALYSIS, ROUTINE W REFLEX MICROSCOPIC
Bacteria, UA: NONE SEEN
Bilirubin Urine: NEGATIVE
Glucose, UA: NEGATIVE mg/dL
Hgb urine dipstick: NEGATIVE
Ketones, ur: NEGATIVE mg/dL
Nitrite: NEGATIVE
Protein, ur: NEGATIVE mg/dL
Specific Gravity, Urine: 1.021 (ref 1.005–1.030)
pH: 6 (ref 5.0–8.0)

## 2024-07-29 LAB — CBC
HCT: 37.2 % (ref 36.0–46.0)
Hemoglobin: 12.7 g/dL (ref 12.0–15.0)
MCH: 32 pg (ref 26.0–34.0)
MCHC: 34.1 g/dL (ref 30.0–36.0)
MCV: 93.7 fL (ref 80.0–100.0)
Platelets: 278 K/uL (ref 150–400)
RBC: 3.97 MIL/uL (ref 3.87–5.11)
RDW: 12.3 % (ref 11.5–15.5)
WBC: 12.8 K/uL — ABNORMAL HIGH (ref 4.0–10.5)
nRBC: 0 % (ref 0.0–0.2)

## 2024-07-29 LAB — HCG, QUANTITATIVE, PREGNANCY: hCG, Beta Chain, Quant, S: 8653 m[IU]/mL — ABNORMAL HIGH

## 2024-07-29 NOTE — Discharge Instructions (Addendum)
 Follow up with your OB GYN for ongoing evaluation and management of pregnancy. Return to ED if abdominal cramping worsens, you develop vaginal bleeding or discharge, or any other new concerning symptoms develop.  Follow up with your primary care physician regarding elevated lipase found on blood work today.

## 2024-07-29 NOTE — ED Triage Notes (Signed)
 Patient c/o lower abdominal cramping that started today.  Patient is [redacted] weeks pregnant and just wants to get checked out.  Patient denies bleeding/discharge.

## 2024-07-29 NOTE — ED Notes (Signed)
 Patient transported to Ultrasound

## 2024-07-29 NOTE — ED Provider Notes (Signed)
 " [place] [facility]ERGENCY DEPARTMENT AT [facility] Provider Note   CSN: [id number] Arrival date & time: 07/29/24  2016     Patient presents with: Abdominal Pain   Carol Paul is a 29 y.o. female.  Pertinent medical history of anxiety, MDD, anemia, asthma, ADHD, bipolar affective disorder, diabetes, hypertension, hyperthyroidism.   Patient is here for evaluation of abdominal cramping x 1 week.  She believes she is [redacted] weeks pregnant based on her last menstrual period and timing of sexual intercourse.  Denies vaginal bleeding or discharge.  Denies fevers, dizziness, or syncope.  Denies falls or injuries.  Denies dysuria, hematuria, or change in bowel habits.  Last bowel movement earlier today and normal.  Abdominal cramping feels similar to menstrual cramps.  She has had some nausea without vomiting.  She has a scheduled appointment with her OB/GYN next Thursday.  She is worried she may be having a miscarriage or an ectopic pregnancy.  The history is provided by the patient.  Abdominal Pain Pain location:  LLQ and RLQ      Prior to Admission medications  Medication Sig Start Date End Date Taking? Authorizing Provider  albuterol  (PROVENTIL ) (5 MG/ML) 0.5% nebulizer solution Take 0.5 mLs (2.5 mg total) by nebulization every 6 (six) hours as needed for wheezing or shortness of breath. 05/02/24   Geiple, Joshua, PA-C  ALPRAZolam  (NIRAVAM ) 0.5 MG dissolvable tablet Take by mouth.    [provider]  alprazolam  (XANAX ) 2 MG tablet Take 1 mg by mouth every 8 (eight) hours as needed for anxiety.    [provider]  amphetamine-dextroamphetamine (ADDERALL) 20 MG tablet Take 1 tablet by mouth. 05/23/22   [provider]  buPROPion  (WELLBUTRIN  XL) 300 MG 24 hr tablet Take 300 mg by mouth daily.    [provider]  [Paused] cloNIDine  (CATAPRES ) 0.1 MG tablet Take 0.1 mg by mouth at bedtime. Wait to take this until your doctor or other care provider tells  you to start again.    [provider]  dicyclomine  (BENTYL ) 20 MG tablet Take 1 tablet (20 mg total) by mouth 2 (two) times daily. 02/25/24   Daralene Lonni BIRCH, PA-C  diphenhydrAMINE  (BENADRYL ) 25 MG tablet Take 50 mg by mouth daily as needed (following anaphylactic reaction).    [provider]  EPINEPHrine  0.3 mg/0.3 mL IJ SOAJ injection Inject 0.3 mg into the muscle as needed for anaphylaxis. 04/28/23   Davis, Jonathon H, MD  famotidine  (PEPCID ) 20 MG tablet Take 20 mg by mouth 2 (two) times daily.    [provider]  fluticasone  (FLONASE ) 50 MCG/ACT nasal spray Place 2 sprays into the nose. 08/18/23 08/17/24  [provider]  hydrOXYzine  (ATARAX ) 25 MG tablet Take 25 mg by mouth 3 (three) times daily as needed for anxiety.    [provider]  levothyroxine  (SYNTHROID ) 25 MCG tablet Take 25-50 mcg by mouth See admin instructions. Take 25 mcg (1 tablet) once daily before breakfast, except take 50 mcg (2 tablets) before breakfast on Monday and Friday.    [provider]  loratadine  (CLARITIN ) 10 MG tablet Take 1 tablet (10 mg total) by mouth daily. 11/25/23   Danford, Lonni SQUIBB, MD  magnesium  oxide (MAG-OX) 400 MG tablet Take 1 tablet by mouth at bedtime. 08/14/23   [provider]  metFORMIN  (GLUCOPHAGE ) 500 MG tablet Take 500 mg by mouth 2 (two) times daily. 10/09/23   [provider]  [Paused] NIFEdipine (PROCARDIA XL/NIFEDICAL XL) 60 MG 24  hr tablet Take 60 mg by mouth. Wait to take this until your doctor or other care provider tells you to start again. 09/12/23 09/11/24  [provider]  NURTEC 75 MG TBDP Take 75 mg by mouth. 10/02/23 10/01/24  [provider]  ondansetron  (ZOFRAN ) 4 MG tablet Take 4 mg by mouth every 8 (eight) hours as needed for nausea or vomiting.    [provider]  ondansetron  (ZOFRAN -ODT) 4 MG disintegrating tablet Take 1 tablet (4 mg total) by mouth every 8 (eight) hours as  needed for nausea or vomiting. 02/25/24   Daralene Lonni BIRCH, PA-C  pantoprazole  (PROTONIX ) 20 MG tablet Take 1 tablet (20 mg total) by mouth daily. 02/25/24   Daralene Lonni BIRCH, PA-C  predniSONE  (DELTASONE ) 20 MG tablet Take 2 tablets (40 mg total) by mouth daily. 05/02/24   Geiple, Joshua, PA-C  promethazine  (PHENERGAN ) 25 MG suppository Place 1 suppository (25 mg total) rectally every 6 (six) hours as needed for nausea or vomiting. 01/15/23   Lola Donnice HERO, MD  promethazine  (PHENERGAN ) 25 MG tablet Take 1 tablet (25 mg total) by mouth every 6 (six) hours as needed for nausea or vomiting. 01/15/23   Lola Donnice HERO, MD  SPIRIVA RESPIMAT 1.25 MCG/ACT AERS SMARTSIG:2 inhalation Via Inhaler Daily    [provider]  SYMBICORT 160-4.5 MCG/ACT inhaler Inhale 2 puffs into the lungs 2 (two) times daily as needed. 09/12/22   [provider]  tiZANidine  (ZANAFLEX ) 4 MG tablet Take 4 mg by mouth 2 (two) times daily as needed.    [provider]  traZODone  (DESYREL ) 100 MG tablet Take 200 mg by mouth at bedtime as needed. 02/17/24   [provider]    Allergies: Peanut oil, Sesame oil, Sesame seed (diagnostic), Shellfish allergy, Soy allergy (obsolete), Soybean oil, Silicone, Egg protein-containing drug products, Milk (cow), Reglan  [metoclopramide ], Wheat, and Prochlorperazine     Review of Systems  Gastrointestinal:  Positive for abdominal pain.    Updated Vital Signs BP 110/76   Pulse 90   Temp 98.2 F (36.8 C)   Resp 19   Wt 78 kg   LMP 06/07/2024 (Approximate)   SpO2 100%   BMI 31.45 kg/m   Physical Exam Vitals and nursing note reviewed.  Constitutional:      General: She is not in acute distress.    Appearance: Normal appearance. She is not ill-appearing, toxic-appearing or diaphoretic.  HENT:     Head: Normocephalic and atraumatic.     Nose: Nose normal.     Mouth/Throat:     Mouth: Mucous membranes are moist.  Eyes:     General: No  scleral icterus.    Extraocular Movements: Extraocular movements intact.     Conjunctiva/sclera: Conjunctivae normal.  Cardiovascular:     Rate and Rhythm: Normal rate and regular rhythm.     Pulses: Normal pulses.     Heart sounds: Normal heart sounds.  Pulmonary:     Effort: Pulmonary effort is normal. No respiratory distress.  Abdominal:     General: Abdomen is flat. Bowel sounds are normal. There is no distension.     Palpations: Abdomen is soft.     Tenderness: There is abdominal tenderness in the right lower quadrant, suprapubic area and left lower quadrant. There is no right CVA tenderness, left CVA tenderness, guarding or rebound. Negative signs include Murphy's sign, Rovsing's sign and McBurney's sign.  Musculoskeletal:        General: No swelling or tenderness. Normal range of  motion.     Right lower leg: No edema.     Left lower leg: No edema.  Skin:    General: Skin is warm and dry.     Capillary Refill: Capillary refill takes less than 2 seconds.     Coloration: Skin is not jaundiced or pale.  Neurological:     Mental Status: She is alert and oriented to person, place, and time.     (all labs ordered are listed, but only abnormal results are displayed) Labs Reviewed  LIPASE, BLOOD - Abnormal; Notable for the following components:      Result Value   Lipase 71 (*)    All other components within normal limits  CBC - Abnormal; Notable for the following components:   WBC 12.8 (*)    All other components within normal limits  URINALYSIS, ROUTINE W REFLEX MICROSCOPIC - Abnormal; Notable for the following components:   Leukocytes,Ua SMALL (*)    All other components within normal limits  HCG, QUANTITATIVE, PREGNANCY - Abnormal; Notable for the following components:   hCG, Beta Chain, Quant, S 8,653 (*)    All other components within normal limits  COMPREHENSIVE METABOLIC PANEL WITH GFR    EKG: None  Radiology: US  OB LESS THAN 14 WEEKS WITH OB TRANSVAGINAL Result  Date: 07/29/2024 EXAM: OBSTETRIC ULTRASOUND FIRST TRIMESTER TECHNIQUE: Transvaginal first trimester obstetric pelvic duplex ultrasound was performed with real-time imaging, color flow Doppler imaging, and spectral analysis. COMPARISON: None available. CLINICAL HISTORY: Pelvic cramping. FINDINGS: UTERUS: No focal myometrial mass. GESTATIONAL SAC(S): Single intrauterine gestational sac present. There is a small subchorionic hemorrhage along the superior margin of the gestational sac. YOLK SAC: Yolk sac is present measuring 3.6 mm. EMBRYO(<11WK) /FETUS(>=11WK): Embryo is present. CROWN RUMP LENGTH: Crown rump length measures 2.1 mm corresponding to gestational age of [redacted] weeks and 5 days. RATE OF CARDIAC ACTIVITY: Heart rate is 95 beats per minute. RIGHT OVARY: Right ovary not well seen. LEFT OVARY: There is a corpus luteum cyst in the left ovary. FREE FLUID: No free fluid. MEASUREMENTS ESTIMATED GESTATIONAL AGE BY CURRENT ULTRASOUND: 5 weeks and 5 days ESTIMATED DUE DATE: 03/26/2025 IMPRESSION: 1. Intrauterine pregnancy with embryonic cardiac activity (heart rate 95 beats per minute) and crown-rump length of 2.1 mm, corresponding to gestational age of [redacted] weeks and 5 days. Cardiac rate is low, which may be related to early gestational age, but short-term follow-up ultrasound should be considered to confirm viability. 2. Small subchorionic hemorrhage along the superior margin of the gestational sac. 3. Corpus luteum cyst in the left ovary, with the right ovary not well seen. Electronically signed by: Greig Pique MD 07/29/2024 10:45 PM EST RP Workstation: [id number]     Procedures   Medications Ordered in the ED - No data to display   Patient presents to the ED for concern of abdominal cramping in early pregnancy, this involves an extensive number of treatment options, and is a complaint that carries with it a high risk of complications and morbidity.  The differential diagnosis includes miscarriage, ectopic  pregnancy, menstrual cycle, gastritis, ovarian cyst, ovarian rupture, ovarian torsion, UTI.     Lab Tests:  I Ordered, and personally interpreted labs.  The pertinent results include:   Elevated lipase of 71 without transaminitis. Previous lipase of 32 5 months ago. Elevated white count of 12.8 Urinalysis not indicative of infection.   Imaging Studies ordered:  I ordered imaging studies including OB ultrasound transvaginal less than 14 weeks  I independently visualized and  interpreted imaging which showed intrauterine pregnancy corresponding to a gestational age of [redacted] weeks and 5 days.  Cardiac rate noted to be low which may be related to early gestational age but short-term follow-up ultrasound should be considered to confirm viability.  Small subchorionic hemorrhage along the superior margin of the gestational sac.  Corpus luteum cyst in the left ovary, with the right ovary not well-seen. I agree with the radiologist interpretation   Problem List / ED Course:     Abdominal cramping in early pregnancy. Vitals, history, and lab work are reassuring. Ultrasound showing intrauterine pregnancy. Corpus luteum cyst on left ovary could be origin of abdominal cramping; right ovary not visualized. I do not feel additional emergent work up warranted at this time. Close follow-up with OBGYN for repeat quantitative hCG and ultrasound to confirm viability. Return precautions discussed and patient verbalized understanding. Elevated lipase.  This is asymptomatic as the patient has no epigastric pain or N/V/D. No white count or transaminitis on lab work. Follow-up with primary care physician for repeat labwork and further evaluation.    Reevaluation:  After the interventions noted above, I reevaluated the patient and found that they have :improved   Dispostion:  After consideration of the diagnostic results and the patients response to treatment, I feel that the patent would benefit from supportive  care in the home setting with continued monitoring of symptoms and close follow-up with OB/GYN for further evaluation with repeat quantitative hCG and ultrasound. Follow-up with PCP for repeat lipase. Return precautions given.                                   Medical Decision Making Amount and/or Complexity of Data Reviewed Labs: ordered. Radiology: ordered.   This note was produced using Electronics Engineer. While the provider has reviewed and verified all clinical information, transcription errors may remain.    Final diagnoses:  Abdominal cramping  Less than [redacted] weeks gestation of pregnancy    ED Discharge Orders     None          Rosina Almarie DELENA DEVONNA 07/30/24 0002    Armenta Canning, MD 07/30/24 0010  "
